# Patient Record
Sex: Male | Born: 1955 | Race: White | Hispanic: No | Smoking: Former smoker
Health system: Southern US, Community
[De-identification: ages and names within clinical notes are randomized; demographics above are authoritative.]

## PROBLEM LIST (undated history)

## (undated) DIAGNOSIS — N189 Chronic kidney disease, unspecified: Secondary | ICD-10-CM

## (undated) DIAGNOSIS — T8859XA Other complications of anesthesia, initial encounter: Secondary | ICD-10-CM

## (undated) DIAGNOSIS — C44310 Basal cell carcinoma of skin of unspecified parts of face: Secondary | ICD-10-CM

## (undated) DIAGNOSIS — M1990 Unspecified osteoarthritis, unspecified site: Secondary | ICD-10-CM

## (undated) DIAGNOSIS — E119 Type 2 diabetes mellitus without complications: Secondary | ICD-10-CM

## (undated) DIAGNOSIS — M792 Neuralgia and neuritis, unspecified: Secondary | ICD-10-CM

## (undated) DIAGNOSIS — R011 Cardiac murmur, unspecified: Secondary | ICD-10-CM

## (undated) DIAGNOSIS — T4145XA Adverse effect of unspecified anesthetic, initial encounter: Secondary | ICD-10-CM

## (undated) DIAGNOSIS — J189 Pneumonia, unspecified organism: Secondary | ICD-10-CM

## (undated) HISTORY — DX: Unspecified osteoarthritis, unspecified site: M19.90

## (undated) HISTORY — PX: BACK SURGERY: SHX140

## (undated) HISTORY — PX: HERNIA REPAIR: SHX51

## (undated) HISTORY — PX: SPINE SURGERY: SHX786

---
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
## 2004-08-09 ENCOUNTER — Ambulatory Visit: Payer: Self-pay | Admitting: Internal Medicine

## 2006-02-14 ENCOUNTER — Ambulatory Visit (HOSPITAL_COMMUNITY): Admission: RE | Admit: 2006-02-14 | Discharge: 2006-02-14 | Payer: Self-pay | Admitting: General Surgery

## 2010-10-12 ENCOUNTER — Encounter: Payer: Self-pay | Admitting: Internal Medicine

## 2010-10-12 ENCOUNTER — Ambulatory Visit (INDEPENDENT_AMBULATORY_CARE_PROVIDER_SITE_OTHER): Payer: Self-pay | Admitting: Internal Medicine

## 2010-10-12 VITALS — BP 130/90 | HR 90 | Temp 98.3°F | Resp 18 | Ht 70.0 in | Wt 204.0 lb

## 2010-10-12 DIAGNOSIS — G8929 Other chronic pain: Secondary | ICD-10-CM

## 2010-10-12 DIAGNOSIS — M545 Low back pain, unspecified: Secondary | ICD-10-CM | POA: Insufficient documentation

## 2010-10-12 MED ORDER — HYDROCODONE-ACETAMINOPHEN 10-650 MG PO TABS: 1.0000 | ORAL_TABLET | Freq: Four times a day (QID) | ORAL | Status: DC | PRN

## 2010-10-12 MED ORDER — CYCLOBENZAPRINE HCL 10 MG PO TABS: 10.0000 mg | ORAL_TABLET | Freq: Three times a day (TID) | ORAL | Status: DC | PRN

## 2010-10-12 NOTE — Patient Instructions (Signed)
Limit your sodium (Salt) intake    It is important that you exercise regularly, at least 20 minutes 3 to 4 times per week.  If you develop chest pain or shortness of breath seek  medical attention.  You need to lose weight.  Consider a lower calorie diet and regular exercise.  Return in one year for follow-up 

## 2010-10-12 NOTE — Progress Notes (Signed)
Subjective:    Patient ID: Vincent [name], male    DOB: 1955/04/14, 55 y.o.   MRN: [id number]  HPI  55 year old patient who is seen today to establish with our practice. He has the phone the past by [facility] family practice and his physicians have retired. He has a history of chronic low back pain that has required narcotic analgesics since October of last year. He states that he consumes approximately 50-60 Vicodin every 3 months he takes not daily but just for acute flares. He does work as a Nutritional therapist. He states that he is said to lumbar surgeries at age 35 and age 44 both performed by Dr. Roxan Hockey.  Past medical history is fairly unremarkable. Has history of mild allergic rhinitis. Past surgical history is remarkable for laminectomy x2 he has had a left angle hernia repair at age 74 and an umbilical hernia repair in 08/07/04  Family history father died in his late 24s of complications of Parkinson's disease. Mother died in her early 43s of an MI history of alcoholism. One brother and one sister are Health  Social history. He was a tobacco user until approximately 15 years ago he is married no children is self-employed as a Nutritional therapist. No health insurance   Review of Systems  Constitutional: Negative for fever, chills, activity change, appetite change and fatigue.  HENT: Negative for hearing loss, ear pain, congestion, rhinorrhea, sneezing, mouth sores, trouble swallowing, neck pain, neck stiffness, dental problem, voice change, sinus pressure and tinnitus.   Eyes: Negative for photophobia, pain, redness and visual disturbance.  Respiratory: Negative for apnea, cough, choking, chest tightness, shortness of breath and wheezing.   Cardiovascular: Negative for chest pain, palpitations and leg swelling.  Gastrointestinal: Negative for nausea, vomiting, abdominal pain, diarrhea, constipation, blood in stool, abdominal distention, anal bleeding and rectal pain.  Genitourinary: Negative for dysuria, urgency,  frequency, hematuria, flank pain, decreased urine volume, discharge, penile swelling, scrotal swelling, difficulty urinating, genital sores and testicular pain.  Musculoskeletal: Negative for myalgias, back pain, joint swelling, arthralgias and gait problem.  Skin: Negative for color change, rash and wound.  Neurological: Negative for dizziness, tremors, seizures, syncope, facial asymmetry, speech difficulty, weakness, light-headedness, numbness and headaches.  Hematological: Negative for adenopathy. Does not bruise/bleed easily.  Psychiatric/Behavioral: Negative for suicidal ideas, hallucinations, behavioral problems, confusion, sleep disturbance, self-injury, dysphoric mood, decreased concentration and agitation. The patient is not nervous/anxious.        Objective:   Physical Exam  Constitutional: He appears well-developed and well-nourished.       Moderately overweight. Blood pressure 130/84 Weight 204  HENT:  Head: Normocephalic and atraumatic.  Right Ear: External ear normal.  Left Ear: External ear normal.  Nose: Nose normal.  Mouth/Throat: Oropharynx is clear and moist.  Eyes: Conjunctivae and EOM are normal. Pupils are equal, round, and reactive to light. No scleral icterus.  Neck: Normal range of motion. Neck supple. No JVD present. No thyromegaly present.  Cardiovascular: Regular rhythm, normal heart sounds and intact distal pulses.  Exam reveals no gallop and no friction rub.   No murmur heard. Pulmonary/Chest: Effort normal and breath sounds normal. He exhibits no tenderness.  Abdominal: Soft. Bowel sounds are normal. He exhibits no distension and no mass. There is no tenderness.  Genitourinary: Prostate normal and penis normal. Guaiac negative stool.  Musculoskeletal: Normal range of motion. He exhibits no edema and no tenderness.  Lymphadenopathy:    He has no cervical adenopathy.  Neurological: He is alert. He has normal  reflexes. No cranial nerve deficit. Coordination  normal.  Skin: Skin is warm and dry. No rash noted.  Psychiatric: He has a normal mood and affect. His behavior is normal.          Assessment & Plan:   Chronic low back pain Exogenous obesity Remote tobacco use  Weight loss encouraged. Medications refilled. Return here in one year or when necessary  A controlled substance contract was discussed and signed

## 2011-06-03 ENCOUNTER — Other Ambulatory Visit: Payer: Self-pay | Admitting: Internal Medicine

## 2011-06-03 MED ORDER — CYCLOBENZAPRINE HCL 10 MG PO TABS: 10.0000 mg | ORAL_TABLET | Freq: Three times a day (TID) | ORAL | Status: DC | PRN

## 2011-06-03 MED ORDER — HYDROCODONE-ACETAMINOPHEN 10-650 MG PO TABS: 1.0000 | ORAL_TABLET | Freq: Four times a day (QID) | ORAL | Status: DC | PRN

## 2011-06-03 NOTE — Telephone Encounter (Signed)
Flexeril 10  #30  RF 3 vicodin  #60  RF 1

## 2011-06-03 NOTE — Telephone Encounter (Signed)
Pt was only seen once to establish 09/2010 Both lorcet and flexeril written at that time # 30 3RF There have been no other request for any meds since then

## 2011-06-03 NOTE — Telephone Encounter (Signed)
Patient called stating that he needs his hydrocodone  refilled and also his flexiril. Please assist. Patient states he has been running out of pain pills and would like the MD to adjust his rx.

## 2011-06-03 NOTE — Telephone Encounter (Signed)
done

## 2011-12-04 ENCOUNTER — Ambulatory Visit (INDEPENDENT_AMBULATORY_CARE_PROVIDER_SITE_OTHER): Payer: Self-pay | Admitting: Internal Medicine

## 2011-12-04 ENCOUNTER — Encounter: Payer: Self-pay | Admitting: Internal Medicine

## 2011-12-04 VITALS — BP 122/76 | Temp 98.4°F | Wt 200.0 lb

## 2011-12-04 DIAGNOSIS — M545 Low back pain, unspecified: Secondary | ICD-10-CM

## 2011-12-04 DIAGNOSIS — G8929 Other chronic pain: Secondary | ICD-10-CM

## 2011-12-04 MED ORDER — HYDROCODONE-ACETAMINOPHEN 10-650 MG PO TABS: 1.0000 | ORAL_TABLET | Freq: Four times a day (QID) | ORAL | Status: DC | PRN

## 2011-12-04 MED ORDER — CYCLOBENZAPRINE HCL 10 MG PO TABS: 10.0000 mg | ORAL_TABLET | Freq: Three times a day (TID) | ORAL | Status: DC | PRN

## 2011-12-04 MED ORDER — NAPROXEN 500 MG PO TABS: 500.0000 mg | ORAL_TABLET | Freq: Two times a day (BID) | ORAL | Status: DC

## 2011-12-04 NOTE — Progress Notes (Signed)
  Subjective:    Patient ID: Vincent [name], male    DOB: 11/07/1955, 56 y.o.   MRN: [id number]  HPI  56 year old patient who is seen today in followup. He establish with our practice one year ago. He has a history of chronic low back pain that is about the same. He is had 2 laminectomies over the years. He uses both anti-inflammatories as well as narcotic analgesics. Most days he uses 1/2-1 hydrocodone daily. There's been no escalation of his pain medication use. 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  Past Medical History  Diagnosis Date  . Arthritis     History   Social History  . Marital Status: Married    Spouse Name: N/A    Number of Children: N/A  . Years of Education: N/A   Occupational History  . Not on file.   Social History Main Topics  . Smoking status: Former Smoker    Quit date: 03/25/1988  . Smokeless tobacco: Never Used  . Alcohol Use: No  . Drug Use: No  . Sexually Active: Not on file   Other Topics Concern  . Not on file   Social History Narrative  . No narrative on file    Past Surgical History  Procedure Date  . Back surgery     x2  . Hernia repair   . Spine surgery     x2    No family history on file.  No Known Allergies  No current outpatient prescriptions on file prior to visit.    BP 122/76  Temp 98.4 F (36.9 C) (Oral)  Wt 200 lb (90.719 kg)       Review of Systems  Constitutional: Negative for fever, chills, appetite change and fatigue.  HENT: Negative for hearing loss, ear pain, congestion, sore throat, trouble swallowing, neck stiffness, dental problem, voice change and tinnitus.   Eyes: Negative for pain, discharge and visual disturbance.  Respiratory: Negative for cough, chest tightness, wheezing and stridor.   Cardiovascular: Negative for chest pain, palpitations and leg swelling.  Gastrointestinal: Negative for nausea, vomiting, abdominal pain, diarrhea, constipation, blood in stool  and abdominal distention.  Genitourinary: Negative for urgency, hematuria, flank pain, discharge, difficulty urinating and genital sores.  Musculoskeletal: Positive for back pain. Negative for myalgias, joint swelling, arthralgias and gait problem.  Skin: Negative for rash.  Neurological: Negative for dizziness, syncope, speech difficulty, weakness, numbness and headaches.  Hematological: Negative for adenopathy. Does not bruise/bleed easily.  Psychiatric/Behavioral: Negative for behavioral problems and dysphoric mood. The patient is not nervous/anxious.        Objective:   Physical Exam  Constitutional: He appears well-developed and well-nourished. No distress.       Blood pressure normal  Cardiovascular: Normal rate and regular rhythm.   Pulmonary/Chest: Effort normal and breath sounds normal. No respiratory distress.  Abdominal: Soft.          Assessment & Plan:   Chronic low back pain Medications dispensed Recheck next year for his annual physical. Hopefully we'll have health insurance at that time

## 2011-12-04 NOTE — Patient Instructions (Signed)
Limit your sodium (Salt) intake  You need to lose weight.  Consider a lower calorie diet and regular exercise.  Return in one year for follow-up   

## 2011-12-09 ENCOUNTER — Ambulatory Visit: Payer: Self-pay | Admitting: Internal Medicine

## 2012-02-27 ENCOUNTER — Telehealth: Payer: Self-pay | Admitting: Internal Medicine

## 2012-02-27 MED ORDER — NAPROXEN 500 MG PO TABS: 500.0000 mg | ORAL_TABLET | Freq: Two times a day (BID) | ORAL | Status: DC | PRN

## 2012-02-27 NOTE — Telephone Encounter (Signed)
Unaware of a 750 dose. Please call in naproxen 500 mg #90 one twice a day when necessary refill x2

## 2012-02-27 NOTE — Telephone Encounter (Signed)
Left message on voicemail. Rx refill for Naproxen 500 mg was sent to pharmacy.

## 2012-02-27 NOTE — Telephone Encounter (Signed)
Patient called stating that he was given a sample of naproxen sodium 750mg  and it worked and he would like to have this called into [facility]/[address]. Please assist and inform patient when done.

## 2012-02-28 NOTE — Telephone Encounter (Signed)
Pt called and has been informed that rx has been called in as noted.

## 2012-05-12 ENCOUNTER — Telehealth: Payer: Self-pay | Admitting: Internal Medicine

## 2012-05-12 NOTE — Telephone Encounter (Signed)
Left message on voicemail,  We do not have samples of Naprelan.

## 2012-05-12 NOTE — Telephone Encounter (Signed)
Pt need samples of naprelan 750 mg manufactured by [employer]

## 2012-05-13 NOTE — Telephone Encounter (Signed)
Left message samples put at the front desk for pick up.

## 2012-05-13 NOTE — Telephone Encounter (Signed)
Left message on voicemail did find samples of Naprelan.

## 2012-06-04 ENCOUNTER — Telehealth: Payer: Self-pay | Admitting: Internal Medicine

## 2012-06-04 MED ORDER — HYDROCODONE-ACETAMINOPHEN 10-325 MG PO TABS: 1.0000 | ORAL_TABLET | Freq: Four times a day (QID) | ORAL | Status: DC | PRN

## 2012-06-04 NOTE — Telephone Encounter (Signed)
Left message on voicemail. New Rx for Hydrocodone called into pharmacy.

## 2012-06-04 NOTE — Telephone Encounter (Signed)
Patient called stating that his pharmacy states laws have changed and he need a new hydrocodone rx to reflect changes. Please assist and inform patient when available.

## 2012-06-30 ENCOUNTER — Telehealth: Payer: Self-pay | Admitting: Internal Medicine

## 2012-06-30 MED ORDER — SILDENAFIL CITRATE 100 MG PO TABS: 100.0000 mg | ORAL_TABLET | Freq: Every day | ORAL | Status: DC | PRN

## 2012-06-30 NOTE — Telephone Encounter (Signed)
Spoke to pt told him Rx for Viagra was sent to pharmacy. Pt verbalized understanding.

## 2012-06-30 NOTE — Telephone Encounter (Signed)
OK  Viagra 100 mg  #6  RF 6

## 2012-06-30 NOTE — Telephone Encounter (Signed)
Pt called to request a RX of Viagra. He would like it send to [facility] at [address]. Please assist. (He also wanted me to tell you how much he appreciates your help with his last RX, and getting it filled)

## 2012-08-21 ENCOUNTER — Ambulatory Visit (INDEPENDENT_AMBULATORY_CARE_PROVIDER_SITE_OTHER): Payer: Self-pay | Admitting: Internal Medicine

## 2012-08-21 ENCOUNTER — Encounter: Payer: Self-pay | Admitting: Internal Medicine

## 2012-08-21 VITALS — BP 140/80 | HR 100 | Temp 98.7°F | Resp 20 | Wt 200.0 lb

## 2012-08-21 DIAGNOSIS — G8929 Other chronic pain: Secondary | ICD-10-CM

## 2012-08-21 DIAGNOSIS — M545 Low back pain, unspecified: Secondary | ICD-10-CM

## 2012-08-21 MED ORDER — CYCLOBENZAPRINE HCL 10 MG PO TABS: 10.0000 mg | ORAL_TABLET | Freq: Three times a day (TID) | ORAL | Status: DC | PRN

## 2012-08-21 MED ORDER — SILDENAFIL CITRATE 100 MG PO TABS: 100.0000 mg | ORAL_TABLET | Freq: Every day | ORAL | Status: DC | PRN

## 2012-08-21 MED ORDER — HYDROCODONE-ACETAMINOPHEN 10-325 MG PO TABS: 1.0000 | ORAL_TABLET | Freq: Four times a day (QID) | ORAL | Status: DC | PRN

## 2012-08-21 NOTE — Patient Instructions (Addendum)
Back Pain, Adult Low back pain is very common. About 1 in 5 people have back pain.The cause of low back pain is rarely dangerous. The pain often gets better over time.About half of people with a sudden onset of back pain feel better in just 2 weeks. About 8 in 10 people feel better by 6 weeks.  CAUSES Some common causes of back pain include:  Strain of the muscles or ligaments supporting the spine.  Wear and tear (degeneration) of the spinal discs.  Arthritis.  Direct injury to the back. DIAGNOSIS Most of the time, the direct cause of low back pain is not known.However, back pain can be treated effectively even when the exact cause of the pain is unknown.Answering your caregiver's questions about your overall health and symptoms is one of the most accurate ways to make sure the cause of your pain is not dangerous. If your caregiver needs more information, he or she may order lab work or imaging tests (X-rays or MRIs).However, even if imaging tests show changes in your back, this usually does not require surgery. HOME CARE INSTRUCTIONS For many people, back pain returns.Since low back pain is rarely dangerous, it is often a condition that people can learn to manageon their own.   Remain active. It is stressful on the back to sit or stand in one place. Do not sit, drive, or stand in one place for more than 30 minutes at a time. Take short walks on level surfaces as soon as pain allows.Try to increase the length of time you walk each day.  Do not stay in bed.Resting more than 1 or 2 days can delay your recovery.  Do not avoid exercise or work.Your body is made to move.It is not dangerous to be active, even though your back may hurt.Your back will likely heal faster if you return to being active before your pain is gone.  Pay attention to your body when you bend and lift. Many people have less discomfortwhen lifting if they bend their knees, keep the load close to their bodies,and  avoid twisting. Often, the most comfortable positions are those that put less stress on your recovering back.  Find a comfortable position to sleep. Use a firm mattress and lie on your side with your knees slightly bent. If you lie on your back, put a pillow under your knees.  Only take over-the-counter or prescription medicines as directed by your caregiver. Over-the-counter medicines to reduce pain and inflammation are often the most helpful.Your caregiver may prescribe muscle relaxant drugs.These medicines help dull your pain so you can more quickly return to your normal activities and healthy exercise.  Put ice on the injured area.  Put ice in a plastic bag.  Place a towel between your skin and the bag.  Leave the ice on for 15-20 minutes, 3-4 times a day for the first 2 to 3 days. After that, ice and heat may be alternated to reduce pain and spasms.  Ask your caregiver about trying back exercises and gentle massage. This may be of some benefit.  Avoid feeling anxious or stressed.Stress increases muscle tension and can worsen back pain.It is important to recognize when you are anxious or stressed and learn ways to manage it.Exercise is a great option. SEEK MEDICAL CARE IF:  You have pain that is not relieved with rest or medicine.  You have pain that does not improve in 1 week.  You have new symptoms.  You are generally not feeling well. SEEK   IMMEDIATE MEDICAL CARE IF:   You have pain that radiates from your back into your legs.  You develop new bowel or bladder control problems.  You have unusual weakness or numbness in your arms or legs.  You develop nausea or vomiting.  You develop abdominal pain.  You feel faint. Document Released: 03/11/2005 Document Revised: 09/10/2011 Document Reviewed: 07/30/2010 [facility] Patient Information 2014 [place], [place]. Back Injury Prevention Back injuries can be extremely painful and difficult to heal. After having one back  injury, you are much more likely to experience another later on. It is important to learn how to avoid injuring or re-injuring your back. The following tips can help you to prevent a back injury. PHYSICAL FITNESS  Exercise regularly and try to develop good tone in your abdominal muscles. Your abdominal muscles provide a lot of the support needed by your back.  Do aerobic exercises (walking, jogging, biking, swimming) regularly.  Do exercises that increase balance and strength (tai chi, yoga) regularly. This can decrease your risk of falling and injuring your back.  Stretch before and after exercising.  Maintain a healthy weight. The more you weigh, the more stress is placed on your back. For every pound of weight, 10 times that amount of pressure is placed on the back. DIET  Talk to your caregiver about how much calcium and vitamin D you need per day. These nutrients help to prevent weakening of the bones (osteoporosis). Osteoporosis can cause broken (fractured) bones that lead to back pain.  Include good sources of calcium in your diet, such as dairy products, green, leafy vegetables, and products with calcium added (fortified).  Include good sources of vitamin D in your diet, such as milk and foods that are fortified with vitamin D.  Consider taking a nutritional supplement or a multivitamin if needed.  Stop smoking if you smoke. POSTURE  Sit and stand up straight. Avoid leaning forward when you sit or hunching over when you stand.  Choose chairs with good low back (lumbar) support.  If you work at a desk, sit close to your work so you do not need to lean over. Keep your chin tucked in. Keep your neck drawn back and elbows bent at a right angle. Your arms should look like the letter "L."  Sit high and close to the steering wheel when you drive. Add a lumbar support to your car seat if needed.  Avoid sitting or standing in one position for too long. Take breaks to get up, stretch,  and walk around at least once every hour. Take breaks if you are driving for long periods of time.  Sleep on your side with your knees slightly bent, or sleep on your back with a pillow under your knees. Do not sleep on your stomach. LIFTING, TWISTING, AND REACHING  Avoid heavy lifting, especially repetitive lifting. If you must do heavy lifting:  Stretch before lifting.  Work slowly.  Rest between lifts.  Use carts and dollies to move objects when possible.  Make several small trips instead of carrying 1 heavy load.  Ask for help when you need it.  Ask for help when moving big, awkward objects.  Follow these steps when lifting:  Stand with your feet shoulder-width apart.  Get as close to the object as you can. Do not try to pick up heavy objects that are far from your body.  Use handles or lifting straps if they are available.  Bend at your knees. Squat down, but keep your  heels off the floor.  Keep your shoulders pulled back, your chin tucked in, and your back straight.  Lift the object slowly, tightening the muscles in your legs, abdomen, and buttocks. Keep the object as close to the center of your body as possible.  When you put a load down, use these same guidelines in reverse.  Do not:  Lift the object above your waist.  Twist at the waist while lifting or carrying a load. Move your feet if you need to turn, not your waist.  Bend over without bending at your knees.  Avoid reaching over your head, across a table, or for an object on a high surface. OTHER TIPS  Avoid wet floors and keep sidewalks clear of ice to prevent falls.  Do not sleep on a mattress that is too soft or too hard.  Keep items that are used frequently within easy reach.  Put heavier objects on shelves at waist level and lighter objects on lower or higher shelves.  Find ways to decrease your stress, such as exercise, massage, or relaxation techniques. Stress can build up in your muscles.  Tense muscles are more vulnerable to injury.  Seek treatment for depression or anxiety if needed. These conditions can increase your risk of developing back pain. SEEK MEDICAL CARE IF:  You injure your back.  You have questions about diet, exercise, or other ways to prevent back injuries. MAKE SURE YOU:  Understand these instructions.  Will watch your condition.  Will get help right away if you are not doing well or get worse. Document Released: 04/18/2004 Document Revised: 06/03/2011 Document Reviewed: 04/22/2011 [facility] Patient Information 2014 [place], [place]. Back Exercises Back exercises help treat and prevent back injuries. The goal of back exercises is to increase the strength of your abdominal and back muscles and the flexibility of your back. These exercises should be started when you no longer have back pain. Back exercises include:  Pelvic Tilt. Lie on your back with your knees bent. Tilt your pelvis until the lower part of your back is against the floor. Hold this position 5 to 10 sec and repeat 5 to 10 times.  Knee to Chest. Pull first 1 knee up against your chest and hold for 20 to 30 seconds, repeat this with the other knee, and then both knees. This may be done with the other leg straight or bent, whichever feels better.  Sit-Ups or Curl-Ups. Bend your knees 90 degrees. Start with tilting your pelvis, and do a partial, slow sit-up, lifting your trunk only 30 to 45 degrees off the floor. Take at least 2 to 3 seconds for each sit-up. Do not do sit-ups with your knees out straight. If partial sit-ups are difficult, simply do the above but with only tightening your abdominal muscles and holding it as directed.  Hip-Lift. Lie on your back with your knees flexed 90 degrees. Push down with your feet and shoulders as you raise your hips a couple inches off the floor; hold for 10 seconds, repeat 5 to 10 times.  Back arches. Lie on your stomach, propping yourself up on bent elbows.  Slowly press on your hands, causing an arch in your low back. Repeat 3 to 5 times. Any initial stiffness and discomfort should lessen with repetition over time.  Shoulder-Lifts. Lie face down with arms beside your body. Keep hips and torso pressed to floor as you slowly lift your head and shoulders off the floor. Do not overdo your exercises, especially in the  beginning. Exercises may cause you some mild back discomfort which lasts for a few minutes; however, if the pain is more severe, or lasts for more than 15 minutes, do not continue exercises until you see your caregiver. Improvement with exercise therapy for back problems is slow.  See your caregivers for assistance with developing a proper back exercise program. Document Released: 04/18/2004 Document Revised: 06/03/2011 Document Reviewed: 01/10/2011 [facility] Patient Information 2014 [place], [place].

## 2012-08-21 NOTE — Progress Notes (Signed)
  Subjective:    Patient ID: Vincent [name], male    DOB: 01-01-1956, 57 y.o.   MRN: [id number]  HPI  57 year old patient who has a history chronic low back pain. He is seen today for followup. He is seen in frequently do to lack of health insurance. He has had a recent flare over the past 4 weeks but the pain has improved in addition to low back pain he also complains of left leg spasm. He states numbness and burning pain involving anterior thigh and occasionally the bottom of his left foot. He denies any motor weakness. No constitutional complaints. He has had laminectomy on 2 occasions but not in greater than 20 years. He is using Advil and the hydrocodone with acetaminophen.  Past Medical History  Diagnosis Date  . Arthritis     History   Social History  . Marital Status: Married    Spouse Name: N/A    Number of Children: N/A  . Years of Education: N/A   Occupational History  . Not on file.   Social History Main Topics  . Smoking status: Former Smoker    Quit date: 03/25/1988  . Smokeless tobacco: Never Used  . Alcohol Use: No  . Drug Use: No  . Sexually Active: Not on file   Other Topics Concern  . Not on file   Social History Narrative  . No narrative on file    Past Surgical History  Procedure Laterality Date  . Back surgery      x2  . Hernia repair    . Spine surgery      x2    History reviewed. No pertinent family history.  No Known Allergies  No current outpatient prescriptions on file prior to visit.   No current facility-administered medications on file prior to visit.    BP 140/80  Pulse 100  Temp(Src) 98.7 F (37.1 C) (Oral)  Resp 20  Wt 200 lb (90.719 kg)  BMI 28.7 kg/m2  SpO2 98%    he has variable    Review of Systems  Constitutional: Negative for fever, chills, appetite change and fatigue.  HENT: Negative for hearing loss, ear pain, congestion, sore throat, trouble swallowing, neck stiffness, dental problem, voice change and  tinnitus.   Eyes: Negative for pain, discharge and visual disturbance.  Respiratory: Negative for cough, chest tightness, wheezing and stridor.   Cardiovascular: Negative for chest pain, palpitations and leg swelling.  Gastrointestinal: Negative for nausea, vomiting, abdominal pain, diarrhea, constipation, blood in stool and abdominal distention.  Genitourinary: Negative for urgency, hematuria, flank pain, discharge, difficulty urinating and genital sores.  Musculoskeletal: Positive for back pain. Negative for myalgias, joint swelling, arthralgias and gait problem.  Skin: Negative for rash.  Neurological: Negative for dizziness, syncope, speech difficulty, weakness, numbness and headaches.  Hematological: Negative for adenopathy. Does not bruise/bleed easily.  Psychiatric/Behavioral: Negative for behavioral problems and dysphoric mood. The patient is not nervous/anxious.        Objective:   Physical Exam  Constitutional: He appears well-developed and well-nourished. No distress.  Musculoskeletal:  Negative straight leg test Patellar reflexes brisk Achilles reflexes symmetrically blunted Able to walk on toes and heels without difficulty          Assessment & Plan:  Chronic low back pain. Medications refilled  Back exercises recommended an information dispensed

## 2012-09-04 ENCOUNTER — Telehealth: Payer: Self-pay | Admitting: Internal Medicine

## 2012-09-04 NOTE — Telephone Encounter (Signed)
Attempted to call back @ 1315 09/04/12- no answer x 2 attempts. Left message to call office if assistance needed. JK/CAN

## 2012-12-07 ENCOUNTER — Encounter: Payer: Self-pay | Admitting: Internal Medicine

## 2012-12-07 ENCOUNTER — Ambulatory Visit (INDEPENDENT_AMBULATORY_CARE_PROVIDER_SITE_OTHER): Payer: Self-pay | Admitting: Internal Medicine

## 2012-12-07 VITALS — BP 130/80 | HR 100 | Temp 98.4°F | Resp 20 | Wt 195.0 lb

## 2012-12-07 DIAGNOSIS — Z23 Encounter for immunization: Secondary | ICD-10-CM

## 2012-12-07 DIAGNOSIS — M545 Low back pain: Secondary | ICD-10-CM

## 2012-12-07 DIAGNOSIS — G4700 Insomnia, unspecified: Secondary | ICD-10-CM

## 2012-12-07 DIAGNOSIS — G8929 Other chronic pain: Secondary | ICD-10-CM

## 2012-12-07 MED ORDER — HYDROCODONE-ACETAMINOPHEN 10-325 MG PO TABS: 1.0000 | ORAL_TABLET | Freq: Four times a day (QID) | ORAL | Status: DC | PRN

## 2012-12-07 MED ORDER — TRAZODONE HCL 50 MG PO TABS: 25.0000 mg | ORAL_TABLET | Freq: Every evening | ORAL | Status: DC | PRN

## 2012-12-07 NOTE — Progress Notes (Signed)
Subjective:    Patient ID: Vincent [name], male    DOB: 12/22/1955, 57 y.o.   MRN: [id number]  HPI  57 year old patient who is seen today for followup he has chronic low back pain and is on chronic narcotic therapy. He's had 2 prior back surgeries. He feels his back is modestly improved. He works as a Nutritional therapist. He also has some insomnia issues and some dermatologic concerns.  Past Medical History  Diagnosis Date  . Arthritis     History   Social History  . Marital Status: Married    Spouse Name: N/A    Number of Children: N/A  . Years of Education: N/A   Occupational History  . Not on file.   Social History Main Topics  . Smoking status: Former Smoker    Quit date: 03/25/1988  . Smokeless tobacco: Never Used  . Alcohol Use: No  . Drug Use: No  . Sexual Activity: Not on file   Other Topics Concern  . Not on file   Social History Narrative  . No narrative on file    Past Surgical History  Procedure Laterality Date  . Back surgery      x2  . Hernia repair    . Spine surgery      x2    History reviewed. No pertinent family history.  No Known Allergies  Current Outpatient Prescriptions on File Prior to Visit  Medication Sig Dispense Refill  . cyclobenzaprine (FLEXERIL) 10 MG tablet Take 1 tablet (10 mg total) by mouth 3 (three) times daily as needed.  30 tablet  6  . sildenafil (VIAGRA) 100 MG tablet Take 1 tablet (100 mg total) by mouth daily as needed for erectile dysfunction.  6 tablet  6   No current facility-administered medications on file prior to visit.    BP 130/80  Pulse 100  Temp(Src) 98.4 F (36.9 C) (Oral)  Resp 20  Wt 195 lb (88.451 kg)  BMI 27.98 kg/m2  SpO2 97%       Review of Systems  Constitutional: Negative for fever, chills, appetite change and fatigue.  HENT: Negative for hearing loss, ear pain, congestion, sore throat, trouble swallowing, neck stiffness, dental problem, voice change and tinnitus.   Eyes: Negative for pain,  discharge and visual disturbance.  Respiratory: Negative for cough, chest tightness, wheezing and stridor.   Cardiovascular: Negative for chest pain, palpitations and leg swelling.  Gastrointestinal: Negative for nausea, vomiting, abdominal pain, diarrhea, constipation, blood in stool and abdominal distention.  Genitourinary: Negative for urgency, hematuria, flank pain, discharge, difficulty urinating and genital sores.  Musculoskeletal: Positive for back pain. Negative for myalgias, joint swelling, arthralgias and gait problem.  Skin: Negative for rash.  Neurological: Negative for dizziness, syncope, speech difficulty, weakness, numbness and headaches.  Hematological: Negative for adenopathy. Does not bruise/bleed easily.  Psychiatric/Behavioral: Positive for sleep disturbance. Negative for behavioral problems and dysphoric mood. The patient is not nervous/anxious.        Objective:   Physical Exam  Constitutional: He is oriented to person, place, and time. He appears well-developed.  HENT:  Head: Normocephalic.  Right Ear: External ear normal.  Left Ear: External ear normal.  Eyes: Conjunctivae and EOM are normal.  Neck: Normal range of motion.  Cardiovascular: Normal rate and normal heart sounds.   Pulmonary/Chest: Breath sounds normal.  Abdominal: Bowel sounds are normal.  Musculoskeletal: Normal range of motion. He exhibits no edema and no tenderness.  Neurological: He is alert and oriented to  person, place, and time.  Psychiatric: He has a normal mood and affect. His behavior is normal.          Assessment & Plan:   Chronic low back pain. Prescription refilled Insomnia. Sleep hygiene issues discussed. Information dispensed. Will give a trial of trazodone  Return in 6 months or as needed Weight loss encouraged

## 2012-12-07 NOTE — Patient Instructions (Addendum)
It is important that you exercise regularly, at least 20 minutes 3 to 4 times per week.  If you develop chest pain or shortness of breath seek  medical attention.  You need to lose weight.  Consider a lower calorie diet and regular exercise.Insomnia Insomnia is frequent trouble falling and/or staying asleep. Insomnia can be a long term problem or a short term problem. Both are common. Insomnia can be a short term problem when the wakefulness is related to a certain stress or worry. Long term insomnia is often related to ongoing stress during waking hours and/or poor sleeping habits. Overtime, sleep deprivation itself can make the problem worse. Every little thing feels more severe because you are overtired and your ability to cope is decreased. CAUSES   Stress, anxiety, and depression.  Poor sleeping habits.  Distractions such as TV in the bedroom.  Naps close to bedtime.  Engaging in emotionally charged conversations before bed.  Technical reading before sleep.  Alcohol and other sedatives. They may make the problem worse. They can hurt normal sleep patterns and normal dream activity.  Stimulants such as caffeine for several hours prior to bedtime.  Pain syndromes and shortness of breath can cause insomnia.  Exercise late at night.  Changing time zones may cause sleeping problems (jet lag). It is sometimes helpful to have someone observe your sleeping patterns. They should look for periods of not breathing during the night (sleep apnea). They should also look to see how long those periods last. If you live alone or observers are uncertain, you can also be observed at a sleep clinic where your sleep patterns will be professionally monitored. Sleep apnea requires a checkup and treatment. Give your caregivers your medical history. Give your caregivers observations your family has made about your sleep.  SYMPTOMS   Not feeling rested in the morning.  Anxiety and restlessness at  bedtime.  Difficulty falling and staying asleep. TREATMENT   Your caregiver may prescribe treatment for an underlying medical disorders. Your caregiver can give advice or help if you are using alcohol or other drugs for self-medication. Treatment of underlying problems will usually eliminate insomnia problems.  Medications can be prescribed for short time use. They are generally not recommended for lengthy use.  Over-the-counter sleep medicines are not recommended for lengthy use. They can be habit forming.  You can promote easier sleeping by making lifestyle changes such as:  Using relaxation techniques that help with breathing and reduce muscle tension.  Exercising earlier in the day.  Changing your diet and the time of your last meal. No night time snacks.  Establish a regular time to go to bed.  Counseling can help with stressful problems and worry.  Soothing music and white noise may be helpful if there are background noises you cannot remove.  Stop tedious detailed work at least one hour before bedtime. HOME CARE INSTRUCTIONS   Keep a diary. Inform your caregiver about your progress. This includes any medication side effects. See your caregiver regularly. Take note of:  Times when you are asleep.  Times when you are awake during the night.  The quality of your sleep.  How you feel the next day. This information will help your caregiver care for you.  Get out of bed if you are still awake after 15 minutes. Read or do some quiet activity. Keep the lights down. Wait until you feel sleepy and go back to bed.  Keep regular sleeping and waking hours. Avoid naps.  Exercise  regularly.  Avoid distractions at bedtime. Distractions include watching television or engaging in any intense or detailed activity like attempting to balance the household checkbook.  Develop a bedtime ritual. Keep a familiar routine of bathing, brushing your teeth, climbing into bed at the same time  each night, listening to soothing music. Routines increase the success of falling to sleep faster.  Use relaxation techniques. This can be using breathing and muscle tension release routines. It can also include visualizing peaceful scenes. You can also help control troubling or intruding thoughts by keeping your mind occupied with boring or repetitive thoughts like the old concept of counting sheep. You can make it more creative like imagining planting one beautiful flower after another in your backyard garden.  During your day, work to eliminate stress. When this is not possible use some of the previous suggestions to help reduce the anxiety that accompanies stressful situations. MAKE SURE YOU:   Understand these instructions.  Will watch your condition.  Will get help right away if you are not doing well or get worse. Document Released: 03/08/2000 Document Revised: 06/03/2011 Document Reviewed: 04/08/2007 [facility] Patient Information 2014 [place], [place].

## 2012-12-07 NOTE — Progress Notes (Signed)
  Subjective:    Patient ID: Vincent [name], male    DOB: 08/24/1955, 57 y.o.   MRN: [id number]  HPI  Wt Readings from Last 3 Encounters:  12/07/12 195 lb (88.451 kg)  08/21/12 200 lb (90.719 kg)  12/04/11 200 lb (90.719 kg)    Review of Systems     Objective:   Physical Exam        Assessment & Plan:

## 2013-01-14 ENCOUNTER — Telehealth: Payer: Self-pay | Admitting: Internal Medicine

## 2013-01-14 MED ORDER — HYDROCODONE-ACETAMINOPHEN 10-325 MG PO TABS: 1.0000 | ORAL_TABLET | Freq: Four times a day (QID) | ORAL | Status: DC | PRN

## 2013-01-14 NOTE — Telephone Encounter (Signed)
Pt notified Rx ready for pick up at front desk.

## 2013-01-14 NOTE — Telephone Encounter (Signed)
Tried to call pt number not working will call again later. Rx ready for pickup. Rx printed and signed, put at front desk for pickup.

## 2013-01-14 NOTE — Telephone Encounter (Signed)
Pt need rx norco

## 2013-02-11 ENCOUNTER — Telehealth: Payer: Self-pay | Admitting: Internal Medicine

## 2013-02-11 NOTE — Telephone Encounter (Signed)
Spoke to pt told him Rx will be ready on Monday 11/24 to pick up. Pt verbalized understanding.

## 2013-02-11 NOTE — Telephone Encounter (Signed)
Pt requesting a refill of HYDROcodone-acetaminophen (NORCO) 10-325 MG per tablet.  Please call when available for pick up

## 2013-02-15 MED ORDER — HYDROCODONE-ACETAMINOPHEN 10-325 MG PO TABS: 1.0000 | ORAL_TABLET | Freq: Four times a day (QID) | ORAL | Status: DC | PRN

## 2013-02-15 NOTE — Telephone Encounter (Signed)
Rx printed and signed, put at front desk for pick up. 

## 2013-03-17 ENCOUNTER — Other Ambulatory Visit: Payer: Self-pay | Admitting: *Deleted

## 2013-03-17 MED ORDER — HYDROCODONE-ACETAMINOPHEN 10-325 MG PO TABS: 1.0000 | ORAL_TABLET | Freq: Four times a day (QID) | ORAL | Status: DC | PRN

## 2013-04-12 ENCOUNTER — Telehealth: Payer: Self-pay | Admitting: Internal Medicine

## 2013-04-12 NOTE — Telephone Encounter (Signed)
Pt needs new rx hydrocodone °

## 2013-04-14 NOTE — Telephone Encounter (Signed)
Pt called stating he is completely out of HYDROcodone-acetaminophen (NORCO) 10-325 MG per tablet.

## 2013-04-15 NOTE — Telephone Encounter (Signed)
Left detailed message Rx will be ready tomorrow for pickup.

## 2013-04-16 MED ORDER — HYDROCODONE-ACETAMINOPHEN 10-325 MG PO TABS: 1.0000 | ORAL_TABLET | Freq: Four times a day (QID) | ORAL | Status: DC | PRN

## 2013-04-16 NOTE — Telephone Encounter (Signed)
Left detailed message Rx ready for pickup at the front desk.

## 2013-04-16 NOTE — Telephone Encounter (Signed)
Rx printed and signed.  

## 2013-04-28 ENCOUNTER — Encounter: Payer: Self-pay | Admitting: Internal Medicine

## 2013-04-28 ENCOUNTER — Ambulatory Visit (INDEPENDENT_AMBULATORY_CARE_PROVIDER_SITE_OTHER): Payer: Self-pay | Admitting: Internal Medicine

## 2013-04-28 VITALS — BP 140/80 | HR 86 | Temp 98.3°F | Resp 20 | Ht 70.0 in | Wt 197.0 lb

## 2013-04-28 DIAGNOSIS — M65839 Other synovitis and tenosynovitis, unspecified forearm: Secondary | ICD-10-CM

## 2013-04-28 DIAGNOSIS — M65849 Other synovitis and tenosynovitis, unspecified hand: Secondary | ICD-10-CM

## 2013-04-28 DIAGNOSIS — M778 Other enthesopathies, not elsewhere classified: Secondary | ICD-10-CM

## 2013-04-28 MED ORDER — METHYLPREDNISOLONE ACETATE 80 MG/ML IJ SUSP
80.0000 mg | Freq: Once | INTRAMUSCULAR | Status: AC
  Administered 2013-04-28: 80 mg via INTRAMUSCULAR

## 2013-04-28 NOTE — Progress Notes (Signed)
Pre-visit discussion using our clinic review tool. No additional management support is needed unless otherwise documented below in the visit note.  

## 2013-04-28 NOTE — Addendum Note (Signed)
Addended by: Marian Sorrow on: 04/28/2013 12:43 PM   Modules accepted: Orders

## 2013-04-28 NOTE — Patient Instructions (Addendum)
You  may move around, but avoid painful motions and activities.  Apply ice to the sore area for 15 to 20 minutes 3 or 4 times daily for the next two to 3 days.  Call or return to clinic prn if these symptoms worsen or fail to improve as anticipated.  Take 400-600 mg of ibuprofen ( Advil, Motrin) with food every  6 hours as needed for pain relief or control of feverTendinitis Tendinitis is swelling and inflammation of the tendons. Tendons are band-like tissues that connect muscle to bone. Tendinitis commonly occurs in the:   Shoulders (rotator cuff).  Heels (Achilles tendon).  Elbows (triceps tendon). CAUSES Tendinitis is usually caused by overusing the tendon, muscles, and joints involved. When the tissue surrounding a tendon (synovium) becomes inflamed, it is called tenosynovitis. Tendinitis commonly develops in people whose jobs require repetitive motions. SYMPTOMS  Pain.  Tenderness.  Mild swelling. DIAGNOSIS Tendinitis is usually diagnosed by physical exam. Your caregiver may also order X-rays or other imaging tests. TREATMENT Your caregiver may recommend certain medicines or exercises for your treatment. HOME CARE INSTRUCTIONS   Use a sling or splint for as long as directed by your caregiver until the pain decreases.  Put ice on the injured area.  Put ice in a plastic bag.  Place a towel between your skin and the bag.  Leave the ice on for 15-20 minutes, 03-04 times a day.  Avoid using the limb while the tendon is painful. Perform gentle range of motion exercises only as directed by your caregiver. Stop exercises if pain or discomfort increase, unless directed otherwise by your caregiver.  Only take over-the-counter or prescription medicines for pain, discomfort, or fever as directed by your caregiver. SEEK MEDICAL CARE IF:   Your pain and swelling increase.  You develop new, unexplained symptoms, especially increased numbness in the hands. MAKE SURE YOU:    Understand these instructions.  Will watch your condition.  Will get help right away if you are not doing well or get worse. Document Released: 03/08/2000 Document Revised: 06/03/2011 Document Reviewed: 05/28/2010 [facility] Patient Information 2014 [place], [place].

## 2013-04-28 NOTE — Progress Notes (Signed)
   Subjective:    Patient ID: Vincent [name], male    DOB: 13-Jan-1956, 58 y.o.   MRN: [id number]  HPI  58 year old patient who presents with a two-week history of left wrist pain. He states that in 2000 he sustained trauma to the left wrist. 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 For the past 2 days pain has intensified. He is using a splint ibuprofen and hydrocodone  Past Medical History  Diagnosis Date  . Arthritis     History   Social History  . Marital Status: Married    Spouse Name: N/A    Number of Children: N/A  . Years of Education: N/A   Occupational History  . Not on file.   Social History Main Topics  . Smoking status: Former Smoker    Quit date: 03/25/1988  . Smokeless tobacco: Never Used  . Alcohol Use: No  . Drug Use: No  . Sexual Activity: Not on file   Other Topics Concern  . Not on file   Social History Narrative  . No narrative on file    Past Surgical History  Procedure Laterality Date  . Back surgery      x2  . Hernia repair    . Spine surgery      x2    History reviewed. No pertinent family history.  No Known Allergies  Current Outpatient Prescriptions on File Prior to Visit  Medication Sig Dispense Refill  . cyclobenzaprine (FLEXERIL) 10 MG tablet Take 1 tablet (10 mg total) by mouth 3 (three) times daily as needed.  30 tablet  6  . HYDROcodone-acetaminophen (NORCO) 10-325 MG per tablet Take 1 tablet by mouth every 6 (six) hours as needed.  120 tablet  0  . sildenafil (VIAGRA) 100 MG tablet Take 1 tablet (100 mg total) by mouth daily as needed for erectile dysfunction.  6 tablet  6  . traZODone (DESYREL) 50 MG tablet Take 0.5-1 tablets (25-50 mg total) by mouth at bedtime as needed for sleep.  30 tablet  3   No current facility-administered medications on file prior to visit.    BP 140/80  Pulse 86  Temp(Src) 98.3 F (36.8 C) (Oral)  Resp 20  Ht 5\' 10"  (1.778 m)  Wt 197 lb (89.359  kg)  BMI 28.27 kg/m2  SpO2 98%       Review of Systems  Musculoskeletal: Positive for arthralgias.       Objective:   Physical Exam  Constitutional: He appears well-developed and well-nourished. No distress.  Musculoskeletal:  Left wrist was examined there seem to be some mild tenderness over the dorsal aspect of the wrist no soft tissue swelling or other inflammatory changes. He said the pain was severe with movement. Was able to flex and extend his left wrist          Assessment & Plan:   Probable left wrist tendinitis. We'll treat with Depo-Medrol 80. Continue ibuprofen wrist splint.

## 2013-05-17 ENCOUNTER — Telehealth: Payer: Self-pay | Admitting: Internal Medicine

## 2013-05-17 MED ORDER — HYDROCODONE-ACETAMINOPHEN 10-325 MG PO TABS: 1.0000 | ORAL_TABLET | Freq: Four times a day (QID) | ORAL | Status: DC | PRN

## 2013-05-17 NOTE — Telephone Encounter (Signed)
Left detailed message Rx ready for pickup, will be at the front desk. Rx printed and signed. 

## 2013-05-17 NOTE — Telephone Encounter (Signed)
Pt request refill HYDROcodone-acetaminophen (NORCO) 10-325 MG per tablet Pt is out of med.

## 2013-06-11 ENCOUNTER — Telehealth: Payer: Self-pay

## 2013-06-11 NOTE — Telephone Encounter (Signed)
Spoke to pt told him Rx will be ready on Monday. Pt verbalized understanding.

## 2013-06-11 NOTE — Telephone Encounter (Signed)
Error // ck °

## 2013-06-11 NOTE — Telephone Encounter (Signed)
Pt req refill HYDROcodone-acetaminophen (NORCO) 10-325 MG per tablet

## 2013-06-14 MED ORDER — HYDROCODONE-ACETAMINOPHEN 10-325 MG PO TABS: 1.0000 | ORAL_TABLET | Freq: Four times a day (QID) | ORAL | Status: DC | PRN

## 2013-06-14 NOTE — Telephone Encounter (Signed)
Left detailed message Rx ready for pickup, will be at the front desk. Rx printed and signed. 

## 2013-07-08 ENCOUNTER — Telehealth: Payer: Self-pay | Admitting: Internal Medicine

## 2013-07-08 NOTE — Telephone Encounter (Signed)
Pt req that his rx for  HYDROcodone-acetaminophen (NORCO) 10-325 MG per tablet be a stronger dose .

## 2013-07-09 NOTE — Telephone Encounter (Signed)
Left message on voicemail to call office.  

## 2013-07-09 NOTE — Telephone Encounter (Signed)
Spoke to pt told him there is nothing stronger Dr. Raliegh Ip can write for him, you are already taking the highest dose of the Hydrocodone. Told pt he would have to make an appointment to discuss pain medication if it is not helping to get something else. Pt verbalized understanding and will call back Monday for an appointment.

## 2013-07-09 NOTE — Telephone Encounter (Signed)
Pt returned your call. pls advise

## 2013-07-13 ENCOUNTER — Telehealth: Payer: Self-pay | Admitting: Internal Medicine

## 2013-07-13 MED ORDER — HYDROCODONE-ACETAMINOPHEN 10-325 MG PO TABS: 1.0000 | ORAL_TABLET | Freq: Four times a day (QID) | ORAL | Status: DC | PRN

## 2013-07-13 NOTE — Telephone Encounter (Signed)
Pt needs new rx hydrocodone thanks donna

## 2013-07-13 NOTE — Telephone Encounter (Signed)
Left detailed message Rx ready for pickup. Rx printed and signed. 

## 2013-08-11 ENCOUNTER — Telehealth: Payer: Self-pay | Admitting: Internal Medicine

## 2013-08-11 MED ORDER — HYDROCODONE-ACETAMINOPHEN 10-325 MG PO TABS: 1.0000 | ORAL_TABLET | Freq: Four times a day (QID) | ORAL | Status: DC | PRN

## 2013-08-11 NOTE — Telephone Encounter (Signed)
Pt needs new rx hydrocodone °

## 2013-08-11 NOTE — Telephone Encounter (Signed)
Pt notified Rx ready for pickup. Rx printed and signed.  

## 2013-09-09 ENCOUNTER — Telehealth: Payer: Self-pay | Admitting: Internal Medicine

## 2013-09-09 MED ORDER — HYDROCODONE-ACETAMINOPHEN 10-325 MG PO TABS
1.0000 | ORAL_TABLET | Freq: Four times a day (QID) | ORAL | Status: DC | PRN
Start: 2013-09-09 — End: 2013-10-07

## 2013-09-09 NOTE — Telephone Encounter (Signed)
Pt needs new rx  hydrocodone . Pt has 2 pills left

## 2013-09-09 NOTE — Telephone Encounter (Signed)
Left detailed message Rx ready for pickup. Rx printed and signed. 

## 2013-10-05 ENCOUNTER — Telehealth: Payer: Self-pay | Admitting: Internal Medicine

## 2013-10-05 NOTE — Telephone Encounter (Signed)
Ok;  notified no early refills

## 2013-10-05 NOTE — Telephone Encounter (Signed)
Please advise, pt has been getting 120 tablets, should have enough till Thurs.

## 2013-10-05 NOTE — Telephone Encounter (Signed)
Pt req rx on HYDROcodone-acetaminophen (NORCO) 10-325 MG per tablet  Pt said he is out of med

## 2013-10-07 MED ORDER — HYDROCODONE-ACETAMINOPHEN 10-325 MG PO TABS: 1.0000 | ORAL_TABLET | Freq: Four times a day (QID) | ORAL | Status: DC | PRN

## 2013-10-07 NOTE — Telephone Encounter (Signed)
Left detailed message Rx ready for pickup. Rx printed and signed. 

## 2013-11-04 ENCOUNTER — Telehealth: Payer: Self-pay | Admitting: Internal Medicine

## 2013-11-04 NOTE — Telephone Encounter (Signed)
Pt needs new hydrocodone. Pt is aware md out of office. Pt will be out on 16 th

## 2013-11-04 NOTE — Telephone Encounter (Signed)
Pt notified Dr. Raliegh Ip is out till Monday will not have Rx till then. Pt verbalized understanding.

## 2013-11-08 MED ORDER — HYDROCODONE-ACETAMINOPHEN 10-325 MG PO TABS: 1.0000 | ORAL_TABLET | Freq: Four times a day (QID) | ORAL | Status: DC | PRN

## 2013-11-08 NOTE — Telephone Encounter (Signed)
Pt notified Rx ready for pickup. Rx printed and signed.  

## 2013-12-06 ENCOUNTER — Telehealth: Payer: Self-pay | Admitting: Internal Medicine

## 2013-12-06 NOTE — Telephone Encounter (Signed)
Pt needs new hydrocodone. Pt is out. Pt is aware may take up to 3 business days

## 2013-12-06 NOTE — Telephone Encounter (Signed)
Please call pt and schedule appointment per Dr. Raliegh Ip this week for pain management.

## 2013-12-06 NOTE — Telephone Encounter (Signed)
Requires return office visit to discuss pain medication use

## 2013-12-06 NOTE — Telephone Encounter (Signed)
Pt has been sch

## 2013-12-06 NOTE — Telephone Encounter (Signed)
Please review and advise. No Contract or Urine Drug screen done on pt.

## 2013-12-07 ENCOUNTER — Encounter: Payer: Self-pay | Admitting: Internal Medicine

## 2013-12-07 ENCOUNTER — Encounter: Payer: Self-pay | Admitting: *Deleted

## 2013-12-07 ENCOUNTER — Ambulatory Visit (INDEPENDENT_AMBULATORY_CARE_PROVIDER_SITE_OTHER): Payer: Self-pay | Admitting: Internal Medicine

## 2013-12-07 VITALS — BP 120/72 | HR 93 | Temp 98.3°F | Resp 20 | Ht 70.0 in | Wt 191.0 lb

## 2013-12-07 DIAGNOSIS — G8929 Other chronic pain: Secondary | ICD-10-CM

## 2013-12-07 DIAGNOSIS — F1110 Opioid abuse, uncomplicated: Secondary | ICD-10-CM

## 2013-12-07 DIAGNOSIS — M545 Low back pain, unspecified: Secondary | ICD-10-CM

## 2013-12-07 DIAGNOSIS — F1190 Opioid use, unspecified, uncomplicated: Secondary | ICD-10-CM | POA: Insufficient documentation

## 2013-12-07 MED ORDER — HYDROCODONE-ACETAMINOPHEN 10-325 MG PO TABS: 1.0000 | ORAL_TABLET | Freq: Four times a day (QID) | ORAL | Status: DC | PRN

## 2013-12-07 NOTE — Patient Instructions (Addendum)
Chronic Back Pain  When back pain lasts longer than 3 months, it is called chronic back pain.People with chronic back pain often go through certain periods that are more intense (flare-ups).  CAUSES Chronic back pain can be caused by wear and tear (degeneration) on different structures in your back. These structures include:  The bones of your spine (vertebrae) and the joints surrounding your spinal cord and nerve roots (facets).  The strong, fibrous tissues that connect your vertebrae (ligaments). Degeneration of these structures may result in pressure on your nerves. This can lead to constant pain. HOME CARE INSTRUCTIONS  Avoid bending, heavy lifting, prolonged sitting, and activities which make the problem worse.  Take brief periods of rest throughout the day to reduce your pain. Lying down or standing usually is better than sitting while you are resting.  Take over-the-counter or prescription medicines only as directed by your caregiver. SEEK IMMEDIATE MEDICAL CARE IF:   You have weakness or numbness in one of your legs or feet.  You have trouble controlling your bladder or bowels.  You have nausea, vomiting, abdominal pain, shortness of breath, or fainting. Document Released: 04/18/2004 Document Revised: 06/03/2011 Document Reviewed: 02/23/2011 [facility] Patient Information 2015 [place], [place]. This information is not intended to replace advice given to you by your health care provider. Make sure you discuss any questions you have with your health care provider. Chronic Pain Chronic pain can be defined as pain that is off and on and lasts for 3-6 months or longer. Many things cause chronic pain, which can make it difficult to make a diagnosis. There are many treatment options available for chronic pain. However, finding a treatment that works well for you may require trying various approaches until the right one is found. Many people benefit from a combination of two or more types of  treatment to control their pain. SYMPTOMS  Chronic pain can occur anywhere in the body and can range from mild to very severe. Some types of chronic pain include:  Headache.  Low back pain.  Cancer pain.  Arthritis pain.  Neurogenic pain. This is pain resulting from damage to nerves. People with chronic pain may also have other symptoms such as:  Depression.  Anger.  Insomnia.  Anxiety. DIAGNOSIS  Your health care provider will help diagnose your condition over time. In many cases, the initial focus will be on excluding possible conditions that could be causing the pain. Depending on your symptoms, your health care provider may order tests to diagnose your condition. Some of these tests may include:   Blood tests.   CT scan.   MRI.   X-rays.   Ultrasounds.   Nerve conduction studies.  You may need to see a specialist.  TREATMENT  Finding treatment that works well may take time. You may be referred to a pain specialist. He or she may prescribe medicine or therapies, such as:   Mindful meditation or yoga.  Shots (injections) of numbing or pain-relieving medicines into the spine or area of pain.  Local electrical stimulation.  Acupuncture.   Massage therapy.   Aroma, color, light, or sound therapy.   Biofeedback.   Working with a physical therapist to keep from getting stiff.   Regular, gentle exercise.   Cognitive or behavioral therapy.   Group support.  Sometimes, surgery may be recommended.  HOME CARE INSTRUCTIONS   Take all medicines as directed by your health care provider.   Lessen stress in your life by relaxing and doing things  such as listening to calming music.   Exercise or be active as directed by your health care provider.   Eat a healthy diet and include things such as vegetables, fruits, fish, and lean meats in your diet.   Keep all follow-up appointments with your health care provider.   Attend a support group  with others suffering from chronic pain. SEEK MEDICAL CARE IF:   Your pain gets worse.   You develop a new pain that was not there before.   You cannot tolerate medicines given to you by your health care provider.   You have new symptoms since your last visit with your health care provider.  SEEK IMMEDIATE MEDICAL CARE IF:   You feel weak.   You have decreased sensation or numbness.   You lose control of bowel or bladder function.   Your pain suddenly gets much worse.   You develop shaking.  You develop chills.  You develop confusion.  You develop chest pain.  You develop shortness of breath.  MAKE SURE YOU:  Understand these instructions.  Will watch your condition.  Will get help right away if you are not doing well or get worse. Document Released: 12/01/2001 Document Revised: 11/11/2012 Document Reviewed: 09/04/2012 [facility] Patient Information 2015 [place], [place]. This information is not intended to replace advice given to you by your health care provider. Make sure you discuss any questions you have with your health care provider. Chronic Pain Chronic pain can be defined as pain that is off and on and lasts for 3-6 months or longer. Many things cause chronic pain, which can make it difficult to make a diagnosis. There are many treatment options available for chronic pain. However, finding a treatment that works well for you may require trying various approaches until the right one is found. Many people benefit from a combination of two or more types of treatment to control their pain. SYMPTOMS  Chronic pain can occur anywhere in the body and can range from mild to very severe. Some types of chronic pain include:  Headache.  Low back pain.  Cancer pain.  Arthritis pain.  Neurogenic pain. This is pain resulting from damage to nerves. People with chronic pain may also have other symptoms such  as:  Depression.  Anger.  Insomnia.  Anxiety. DIAGNOSIS  Your health care provider will help diagnose your condition over time. In many cases, the initial focus will be on excluding possible conditions that could be causing the pain. Depending on your symptoms, your health care provider may order tests to diagnose your condition. Some of these tests may include:   Blood tests.   CT scan.   MRI.   X-rays.   Ultrasounds.   Nerve conduction studies.  You may need to see a specialist.  TREATMENT  Finding treatment that works well may take time. You may be referred to a pain specialist. He or she may prescribe medicine or therapies, such as:   Mindful meditation or yoga.  Shots (injections) of numbing or pain-relieving medicines into the spine or area of pain.  Local electrical stimulation.  Acupuncture.   Massage therapy.   Aroma, color, light, or sound therapy.   Biofeedback.   Working with a physical therapist to keep from getting stiff.   Regular, gentle exercise.   Cognitive or behavioral therapy.   Group support.  Sometimes, surgery may be recommended.  HOME CARE INSTRUCTIONS   Take all medicines as directed by your health care provider.   Lessen  stress in your life by relaxing and doing things such as listening to calming music.   Exercise or be active as directed by your health care provider.   Eat a healthy diet and include things such as vegetables, fruits, fish, and lean meats in your diet.   Keep all follow-up appointments with your health care provider.   Attend a support group with others suffering from chronic pain. SEEK MEDICAL CARE IF:   Your pain gets worse.   You develop a new pain that was not there before.   You cannot tolerate medicines given to you by your health care provider.   You have new symptoms since your last visit with your health care provider.  SEEK IMMEDIATE MEDICAL CARE IF:   You feel weak.    You have decreased sensation or numbness.   You lose control of bowel or bladder function.   Your pain suddenly gets much worse.   You develop shaking.  You develop chills.  You develop confusion.  You develop chest pain.  You develop shortness of breath.  MAKE SURE YOU:  Understand these instructions.  Will watch your condition.  Will get help right away if you are not doing well or get worse. Document Released: 12/01/2001 Document Revised: 11/11/2012 Document Reviewed: 09/04/2012 [facility] Patient Information 2015 [place], [place]. This information is not intended to replace advice given to you by your health care provider. Make sure you discuss any questions you have with your health care provider. Chronic Pain Chronic pain can be defined as pain that is off and on and lasts for 3-6 months or longer. Many things cause chronic pain, which can make it difficult to make a diagnosis. There are many treatment options available for chronic pain. However, finding a treatment that works well for you may require trying various approaches until the right one is found. Many people benefit from a combination of two or more types of treatment to control their pain. SYMPTOMS  Chronic pain can occur anywhere in the body and can range from mild to very severe. Some types of chronic pain include:  Headache.  Low back pain.  Cancer pain.  Arthritis pain.  Neurogenic pain. This is pain resulting from damage to nerves. People with chronic pain may also have other symptoms such as:  Depression.  Anger.  Insomnia.  Anxiety. DIAGNOSIS  Your health care provider will help diagnose your condition over time. In many cases, the initial focus will be on excluding possible conditions that could be causing the pain. Depending on your symptoms, your health care provider may order tests to diagnose your condition. Some of these tests may include:   Blood tests.   CT scan.   MRI.    X-rays.   Ultrasounds.   Nerve conduction studies.  You may need to see a specialist.  TREATMENT  Finding treatment that works well may take time. You may be referred to a pain specialist. He or she may prescribe medicine or therapies, such as:   Mindful meditation or yoga.  Shots (injections) of numbing or pain-relieving medicines into the spine or area of pain.  Local electrical stimulation.  Acupuncture.   Massage therapy.   Aroma, color, light, or sound therapy.   Biofeedback.   Working with a physical therapist to keep from getting stiff.   Regular, gentle exercise.   Cognitive or behavioral therapy.   Group support.  Sometimes, surgery may be recommended.  HOME CARE INSTRUCTIONS   Take all medicines as  directed by your health care provider.   Lessen stress in your life by relaxing and doing things such as listening to calming music.   Exercise or be active as directed by your health care provider.   Eat a healthy diet and include things such as vegetables, fruits, fish, and lean meats in your diet.   Keep all follow-up appointments with your health care provider.   Attend a support group with others suffering from chronic pain. SEEK MEDICAL CARE IF:   Your pain gets worse.   You develop a new pain that was not there before.   You cannot tolerate medicines given to you by your health care provider.   You have new symptoms since your last visit with your health care provider.  SEEK IMMEDIATE MEDICAL CARE IF:   You feel weak.   You have decreased sensation or numbness.   You lose control of bowel or bladder function.   Your pain suddenly gets much worse.   You develop shaking.  You develop chills.  You develop confusion.  You develop chest pain.  You develop shortness of breath.  MAKE SURE YOU:  Understand these instructions.  Will watch your condition.  Will get help right away if you are not doing well or  get worse. Document Released: 12/01/2001 Document Revised: 11/11/2012 Document Reviewed: 09/04/2012 [facility] Patient Information 2015 [place], [place]. This information is not intended to replace advice given to you by your health care provider. Make sure you discuss any questions you have with your health care provider. Chronic Pain Chronic pain can be defined as pain that is off and on and lasts for 3-6 months or longer. Many things cause chronic pain, which can make it difficult to make a diagnosis. There are many treatment options available for chronic pain. However, finding a treatment that works well for you may require trying various approaches until the right one is found. Many people benefit from a combination of two or more types of treatment to control their pain. SYMPTOMS  Chronic pain can occur anywhere in the body and can range from mild to very severe. Some types of chronic pain include:  Headache.  Low back pain.  Cancer pain.  Arthritis pain.  Neurogenic pain. This is pain resulting from damage to nerves. People with chronic pain may also have other symptoms such as:  Depression.  Anger.  Insomnia.  Anxiety. DIAGNOSIS  Your health care provider will help diagnose your condition over time. In many cases, the initial focus will be on excluding possible conditions that could be causing the pain. Depending on your symptoms, your health care provider may order tests to diagnose your condition. Some of these tests may include:   Blood tests.   CT scan.   MRI.   X-rays.   Ultrasounds.   Nerve conduction studies.  You may need to see a specialist.  TREATMENT  Finding treatment that works well may take time. You may be referred to a pain specialist. He or she may prescribe medicine or therapies, such as:   Mindful meditation or yoga.  Shots (injections) of numbing or pain-relieving medicines into the spine or area of pain.  Local electrical  stimulation.  Acupuncture.   Massage therapy.   Aroma, color, light, or sound therapy.   Biofeedback.   Working with a physical therapist to keep from getting stiff.   Regular, gentle exercise.   Cognitive or behavioral therapy.   Group support.  Sometimes, surgery may be recommended.  HOME CARE INSTRUCTIONS   Take all medicines as directed by your health care provider.   Lessen stress in your life by relaxing and doing things such as listening to calming music.   Exercise or be active as directed by your health care provider.   Eat a healthy diet and include things such as vegetables, fruits, fish, and lean meats in your diet.   Keep all follow-up appointments with your health care provider.   Attend a support group with others suffering from chronic pain. SEEK MEDICAL CARE IF:   Your pain gets worse.   You develop a new pain that was not there before.   You cannot tolerate medicines given to you by your health care provider.   You have new symptoms since your last visit with your health care provider.  SEEK IMMEDIATE MEDICAL CARE IF:   You feel weak.   You have decreased sensation or numbness.   You lose control of bowel or bladder function.   Your pain suddenly gets much worse.   You develop shaking.  You develop chills.  You develop confusion.  You develop chest pain.  You develop shortness of breath.  MAKE SURE YOU:  Understand these instructions.  Will watch your condition.  Will get help right away if you are not doing well or get worse. Document Released: 12/01/2001 Document Revised: 11/11/2012 Document Reviewed: 09/04/2012 [facility] Patient Information 2015 [place], [place]. This information is not intended to replace advice given to you by your health care provider. Make sure you discuss any questions you have with your health care provider. Chronic Pain Chronic pain can be defined as pain that is off and on and lasts  for 3-6 months or longer. Many things cause chronic pain, which can make it difficult to make a diagnosis. There are many treatment options available for chronic pain. However, finding a treatment that works well for you may require trying various approaches until the right one is found. Many people benefit from a combination of two or more types of treatment to control their pain. SYMPTOMS  Chronic pain can occur anywhere in the body and can range from mild to very severe. Some types of chronic pain include:  Headache.  Low back pain.  Cancer pain.  Arthritis pain.  Neurogenic pain. This is pain resulting from damage to nerves. People with chronic pain may also have other symptoms such as:  Depression.  Anger.  Insomnia.  Anxiety. DIAGNOSIS  Your health care provider will help diagnose your condition over time. In many cases, the initial focus will be on excluding possible conditions that could be causing the pain. Depending on your symptoms, your health care provider may order tests to diagnose your condition. Some of these tests may include:   Blood tests.   CT scan.   MRI.   X-rays.   Ultrasounds.   Nerve conduction studies.  You may need to see a specialist.  TREATMENT  Finding treatment that works well may take time. You may be referred to a pain specialist. He or she may prescribe medicine or therapies, such as:   Mindful meditation or yoga.  Shots (injections) of numbing or pain-relieving medicines into the spine or area of pain.  Local electrical stimulation.  Acupuncture.   Massage therapy.   Aroma, color, light, or sound therapy.   Biofeedback.   Working with a physical therapist to keep from getting stiff.   Regular, gentle exercise.   Cognitive or behavioral therapy.   Group  support.  Sometimes, surgery may be recommended.  HOME CARE INSTRUCTIONS   Take all medicines as directed by your health care provider.   Lessen  stress in your life by relaxing and doing things such as listening to calming music.   Exercise or be active as directed by your health care provider.   Eat a healthy diet and include things such as vegetables, fruits, fish, and lean meats in your diet.   Keep all follow-up appointments with your health care provider.   Attend a support group with others suffering from chronic pain. SEEK MEDICAL CARE IF:   Your pain gets worse.   You develop a new pain that was not there before.   You cannot tolerate medicines given to you by your health care provider.   You have new symptoms since your last visit with your health care provider.  SEEK IMMEDIATE MEDICAL CARE IF:   You feel weak.   You have decreased sensation or numbness.   You lose control of bowel or bladder function.   Your pain suddenly gets much worse.   You develop shaking.  You develop chills.  You develop confusion.  You develop chest pain.  You develop shortness of breath.  MAKE SURE YOU:  Understand these instructions.  Will watch your condition.  Will get help right away if you are not doing well or get worse. Document Released: 12/01/2001 Document Revised: 11/11/2012 Document Reviewed: 09/04/2012 [facility] Patient Information 2015 [place], [place]. This information is not intended to replace advice given to you by your health care provider. Make sure you discuss any questions you have with your health care provider. Chronic Pain Chronic pain can be defined as pain that is off and on and lasts for 3-6 months or longer. Many things cause chronic pain, which can make it difficult to make a diagnosis. There are many treatment options available for chronic pain. However, finding a treatment that works well for you may require trying various approaches until the right one is found. Many people benefit from a combination of two or more types of treatment to control their pain. SYMPTOMS  Chronic pain can  occur anywhere in the body and can range from mild to very severe. Some types of chronic pain include:  Headache.  Low back pain.  Cancer pain.  Arthritis pain.  Neurogenic pain. This is pain resulting from damage to nerves. People with chronic pain may also have other symptoms such as:  Depression.  Anger.  Insomnia.  Anxiety. DIAGNOSIS  Your health care provider will help diagnose your condition over time. In many cases, the initial focus will be on excluding possible conditions that could be causing the pain. Depending on your symptoms, your health care provider may order tests to diagnose your condition. Some of these tests may include:   Blood tests.   CT scan.   MRI.   X-rays.   Ultrasounds.   Nerve conduction studies.  You may need to see a specialist.  TREATMENT  Finding treatment that works well may take time. You may be referred to a pain specialist. He or she may prescribe medicine or therapies, such as:   Mindful meditation or yoga.  Shots (injections) of numbing or pain-relieving medicines into the spine or area of pain.  Local electrical stimulation.  Acupuncture.   Massage therapy.   Aroma, color, light, or sound therapy.   Biofeedback.   Working with a physical therapist to keep from getting stiff.   Regular, gentle exercise.  Cognitive or behavioral therapy.   Group support.  Sometimes, surgery may be recommended.  HOME CARE INSTRUCTIONS   Take all medicines as directed by your health care provider.   Lessen stress in your life by relaxing and doing things such as listening to calming music.   Exercise or be active as directed by your health care provider.   Eat a healthy diet and include things such as vegetables, fruits, fish, and lean meats in your diet.   Keep all follow-up appointments with your health care provider.   Attend a support group with others suffering from chronic pain. SEEK MEDICAL CARE  IF:   Your pain gets worse.   You develop a new pain that was not there before.   You cannot tolerate medicines given to you by your health care provider.   You have new symptoms since your last visit with your health care provider.  SEEK IMMEDIATE MEDICAL CARE IF:   You feel weak.   You have decreased sensation or numbness.   You lose control of bowel or bladder function.   Your pain suddenly gets much worse.   You develop shaking.  You develop chills.  You develop confusion.  You develop chest pain.  You develop shortness of breath.  MAKE SURE YOU:  Understand these instructions.  Will watch your condition.  Will get help right away if you are not doing well or get worse. Document Released: 12/01/2001 Document Revised: 11/11/2012 Document Reviewed: 09/04/2012 [facility] Patient Information 2015 [place], [place]. This information is not intended to replace advice given to you by your health care provider. Make sure you discuss any questions you have with your health care provider.

## 2013-12-07 NOTE — Progress Notes (Signed)
Subjective:    Patient ID: Vincent [name], male    DOB: December 21, 1955, 58 y.o.   MRN: [id number]  HPI  58 year old patient who is seen today in followup.  He has history of chronic low back pain and is status post laminectomy x2.  He receives monthly prescriptions for hydrocodone.  He states that he remains quite functional and does work daily.  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  No health insurance.  He does have a signed pain contract  Past Medical History  Diagnosis Date  . Arthritis     History   Social History  . Marital Status: Married    Spouse Name: N/A    Number of Children: N/A  . Years of Education: N/A   Occupational History  . Not on file.   Social History Main Topics  . Smoking status: Former Smoker    Quit date: 03/25/1988  . Smokeless tobacco: Never Used  . Alcohol Use: No  . Drug Use: No  . Sexual Activity: Not on file   Other Topics Concern  . Not on file   Social History Narrative  . No narrative on file    Past Surgical History  Procedure Laterality Date  . Back surgery      x2  . Hernia repair    . Spine surgery      x2    No family history on file.  No Known Allergies  Current Outpatient Prescriptions on File Prior to Visit  Medication Sig Dispense Refill  . cyclobenzaprine (FLEXERIL) 10 MG tablet Take 1 tablet (10 mg total) by mouth 3 (three) times daily as needed.  30 tablet  6  . HYDROcodone-acetaminophen (NORCO) 10-325 MG per tablet Take 1 tablet by mouth every 6 (six) hours as needed.  120 tablet  0  . sildenafil (VIAGRA) 100 MG tablet Take 1 tablet (100 mg total) by mouth daily as needed for erectile dysfunction.  6 tablet  6   No current facility-administered medications on file prior to visit.    BP 120/72  Pulse 93  Temp(Src) 98.3 F (36.8 C) (Oral)  Resp 20  Ht 5\' 10"  (1.778 m)  Wt 191 lb (86.637 kg)  BMI 27.41 kg/m2  SpO2 98%     Review of Systems  Constitutional: Negative  for fever, chills, appetite change and fatigue.  HENT: Negative for congestion, dental problem, ear pain, hearing loss, sore throat, tinnitus, trouble swallowing and voice change.   Eyes: Negative for pain, discharge and visual disturbance.  Respiratory: Negative for cough, chest tightness, wheezing and stridor.   Cardiovascular: Negative for chest pain, palpitations and leg swelling.  Gastrointestinal: Negative for nausea, vomiting, abdominal pain, diarrhea, constipation, blood in stool and abdominal distention.  Genitourinary: Negative for urgency, hematuria, flank pain, discharge, difficulty urinating and genital sores.  Musculoskeletal: Positive for back pain. Negative for arthralgias, gait problem, joint swelling, myalgias and neck stiffness.  Skin: Negative for rash.  Neurological: Negative for dizziness, syncope, speech difficulty, weakness, numbness and headaches.  Hematological: Negative for adenopathy. Does not bruise/bleed easily.  Psychiatric/Behavioral: Negative for behavioral problems and dysphoric mood. The patient is not nervous/anxious.        Objective:   Physical Exam  Constitutional: He appears well-developed and well-nourished. No distress.  Blood pressure low normal          Assessment & Plan:   Chronic low back pain Chronic narcotic use  We'll check a UDS Medications  refilled 

## 2013-12-07 NOTE — Progress Notes (Signed)
Pre visit review using our clinic review tool, if applicable. No additional management support is needed unless otherwise documented below in the visit note. 

## 2013-12-16 ENCOUNTER — Encounter: Payer: Self-pay | Admitting: Internal Medicine

## 2014-01-05 ENCOUNTER — Telehealth: Payer: Self-pay | Admitting: Internal Medicine

## 2014-01-05 NOTE — Telephone Encounter (Signed)
Pt needs new rx hydrocodone °

## 2014-01-06 MED ORDER — HYDROCODONE-ACETAMINOPHEN 10-325 MG PO TABS: 1.0000 | ORAL_TABLET | Freq: Four times a day (QID) | ORAL | Status: DC | PRN

## 2014-01-06 NOTE — Telephone Encounter (Signed)
Left detailed message Rx ready for pickup. Rx printed and signed. 

## 2014-02-03 ENCOUNTER — Telehealth: Payer: Self-pay | Admitting: Internal Medicine

## 2014-02-03 MED ORDER — HYDROCODONE-ACETAMINOPHEN 10-325 MG PO TABS: 1.0000 | ORAL_TABLET | Freq: Four times a day (QID) | ORAL | Status: DC | PRN

## 2014-02-03 NOTE — Telephone Encounter (Signed)
Left detailed message Rx ready for pickup. Rx printed and signed. 

## 2014-02-03 NOTE — Telephone Encounter (Signed)
Pt needs refill on HYDROcodone-acetaminophen (NORCO) 10-325 MG per tablet. °

## 2014-03-07 ENCOUNTER — Telehealth: Payer: Self-pay | Admitting: Internal Medicine

## 2014-03-07 MED ORDER — HYDROCODONE-ACETAMINOPHEN 10-325 MG PO TABS: 1.0000 | ORAL_TABLET | Freq: Four times a day (QID) | ORAL | Status: DC | PRN

## 2014-03-07 NOTE — Telephone Encounter (Signed)
Patient needs a re-fill on HYDROcodone-acetaminophen (NORCO) 10-325 MG per tablet. Patient is aware Dr. Raliegh Ip is out of the office

## 2014-03-07 NOTE — Telephone Encounter (Signed)
Left detailed message Rx ready for pickup. Rx printed and signed by Dr. Todd. 

## 2014-04-04 ENCOUNTER — Telehealth: Payer: Self-pay | Admitting: Internal Medicine

## 2014-04-04 MED ORDER — CYCLOBENZAPRINE HCL 10 MG PO TABS: 10.0000 mg | ORAL_TABLET | Freq: Three times a day (TID) | ORAL | Status: DC | PRN

## 2014-04-04 NOTE — Telephone Encounter (Signed)
Patient need a re-fill on HYDROcodone-acetaminophen (NORCO) 10-325 MG per tablet and cyclobenzaprine (FLEXERIL) 10 MG tablet.  [place], [place]

## 2014-04-06 ENCOUNTER — Telehealth: Payer: Self-pay | Admitting: Internal Medicine

## 2014-04-06 NOTE — Telephone Encounter (Signed)
Pt following up on refill request HYDROcodone-acetaminophen (NORCO) 10-325 MG per tablet

## 2014-04-06 NOTE — Telephone Encounter (Signed)
Opened in error

## 2014-04-07 MED ORDER — HYDROCODONE-ACETAMINOPHEN 10-325 MG PO TABS: 1.0000 | ORAL_TABLET | Freq: Four times a day (QID) | ORAL | Status: DC | PRN

## 2014-04-07 NOTE — Telephone Encounter (Signed)
Left detailed message Rx ready for pickup. Rx printed and signed. 

## 2014-04-26 ENCOUNTER — Ambulatory Visit (INDEPENDENT_AMBULATORY_CARE_PROVIDER_SITE_OTHER): Payer: Self-pay | Admitting: Internal Medicine

## 2014-04-26 ENCOUNTER — Encounter: Payer: Self-pay | Admitting: Internal Medicine

## 2014-04-26 VITALS — BP 140/80 | HR 96 | Temp 98.6°F | Wt 190.0 lb

## 2014-04-26 DIAGNOSIS — M545 Low back pain: Secondary | ICD-10-CM

## 2014-04-26 DIAGNOSIS — M25512 Pain in left shoulder: Secondary | ICD-10-CM

## 2014-04-26 DIAGNOSIS — F1190 Opioid use, unspecified, uncomplicated: Secondary | ICD-10-CM

## 2014-04-26 DIAGNOSIS — G8929 Other chronic pain: Secondary | ICD-10-CM

## 2014-04-26 NOTE — Progress Notes (Signed)
Pre visit review using our clinic review tool, if applicable. No additional management support is needed unless otherwise documented below in the visit note. 
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
## 2014-04-26 NOTE — Patient Instructions (Signed)
You  may move around, but avoid painful motions and activities.  Apply heat  to the sore area for 15 to 20 minutes 3 or 4 times daily for the next two to 3 days.  Take over-the-counter or prescription medicines for pain, discomfort , such as Advil 2 tablets every 6 hours. Rest the injured area.

## 2014-05-06 ENCOUNTER — Telehealth: Payer: Self-pay | Admitting: Internal Medicine

## 2014-05-06 MED ORDER — HYDROCODONE-ACETAMINOPHEN 10-325 MG PO TABS: 1.0000 | ORAL_TABLET | Freq: Four times a day (QID) | ORAL | Status: DC | PRN

## 2014-05-06 NOTE — Telephone Encounter (Signed)
Left detailed message Rx ready for pickup. Rx printed and signed. 

## 2014-05-06 NOTE — Telephone Encounter (Signed)
Pt request refill HYDROcodone-acetaminophen (NORCO) 10-325 MG per tablet °

## 2014-06-06 ENCOUNTER — Telehealth: Payer: Self-pay | Admitting: Internal Medicine

## 2014-06-06 MED ORDER — HYDROCODONE-ACETAMINOPHEN 10-325 MG PO TABS: 1.0000 | ORAL_TABLET | Freq: Four times a day (QID) | ORAL | Status: DC | PRN

## 2014-06-06 NOTE — Telephone Encounter (Signed)
Pt needs new rx hydrocodone °

## 2014-06-06 NOTE — Telephone Encounter (Signed)
Left detailed message Rx ready for pickup, will be at the front desk. Rx printed and signed. 

## 2014-07-06 ENCOUNTER — Telehealth: Payer: Self-pay | Admitting: Internal Medicine

## 2014-07-06 NOTE — Telephone Encounter (Signed)
Pt needs new rx hydrocodone °

## 2014-07-07 MED ORDER — HYDROCODONE-ACETAMINOPHEN 10-325 MG PO TABS: 1.0000 | ORAL_TABLET | Freq: Four times a day (QID) | ORAL | Status: DC | PRN

## 2014-07-07 NOTE — Telephone Encounter (Signed)
Left detailed message Rx ready for pickup. Rx printed and signed by Dr.Todd.

## 2014-08-04 ENCOUNTER — Telehealth: Payer: Self-pay | Admitting: Internal Medicine

## 2014-08-04 NOTE — Telephone Encounter (Signed)
Pt request refill of the following: HYDROcodone-acetaminophen (NORCO) 10-325 MG per tablet ° ° °Phamacy: ° °

## 2014-08-05 MED ORDER — HYDROCODONE-ACETAMINOPHEN 10-325 MG PO TABS: 1.0000 | ORAL_TABLET | Freq: Four times a day (QID) | ORAL | Status: DC | PRN

## 2014-08-05 NOTE — Telephone Encounter (Signed)
Left detailed message Rx ready for pickup. Rx printed and signed. 

## 2014-09-05 ENCOUNTER — Telehealth: Payer: Self-pay | Admitting: Internal Medicine

## 2014-09-05 MED ORDER — HYDROCODONE-ACETAMINOPHEN 10-325 MG PO TABS: 1.0000 | ORAL_TABLET | Freq: Four times a day (QID) | ORAL | Status: DC | PRN

## 2014-09-05 NOTE — Telephone Encounter (Signed)
Left message Rx ready for pickup. Rx printed and signed.  

## 2014-09-05 NOTE — Telephone Encounter (Signed)
Pt needs new rx hydrocodone °

## 2014-10-05 ENCOUNTER — Telehealth: Payer: Self-pay | Admitting: Internal Medicine

## 2014-10-05 MED ORDER — HYDROCODONE-ACETAMINOPHEN 10-325 MG PO TABS: 1.0000 | ORAL_TABLET | Freq: Four times a day (QID) | ORAL | Status: DC | PRN

## 2014-10-05 NOTE — Telephone Encounter (Signed)
Pt needs new rx hydrocodone °

## 2014-10-05 NOTE — Telephone Encounter (Signed)
Left detailed message Rx ready for pickup, will be at the front desk. Rx printed and signed. 

## 2014-11-04 ENCOUNTER — Telehealth: Payer: Self-pay | Admitting: Internal Medicine

## 2014-11-04 NOTE — Telephone Encounter (Signed)
Refill ok? 

## 2014-11-04 NOTE — Telephone Encounter (Signed)
Pt request refill HYDROcodone-acetaminophen (NORCO) 10-325 MG per tablet °

## 2014-11-07 MED ORDER — HYDROCODONE-ACETAMINOPHEN 10-325 MG PO TABS: 1.0000 | ORAL_TABLET | Freq: Four times a day (QID) | ORAL | Status: DC | PRN

## 2014-11-07 NOTE — Telephone Encounter (Signed)
Pt notified Rx ready for pickup. Rx printed and signed.  

## 2014-11-07 NOTE — Telephone Encounter (Signed)
ok 

## 2014-12-07 ENCOUNTER — Telehealth: Payer: Self-pay | Admitting: Internal Medicine

## 2014-12-07 NOTE — Telephone Encounter (Signed)
Pt needs new rx hydrocodone °

## 2014-12-08 MED ORDER — HYDROCODONE-ACETAMINOPHEN 10-325 MG PO TABS: 1.0000 | ORAL_TABLET | Freq: Four times a day (QID) | ORAL | Status: DC | PRN

## 2014-12-08 NOTE — Telephone Encounter (Signed)
Pt notified Rx ready for pickup. Rx printed and signed.  

## 2015-01-05 ENCOUNTER — Other Ambulatory Visit: Payer: Self-pay | Admitting: Internal Medicine

## 2015-01-05 NOTE — Telephone Encounter (Signed)
Pt request refill of the following: HYDROcodone-acetaminophen (NORCO) 10-325 MG per tablet ° ° °Phamacy: ° °

## 2015-01-05 NOTE — Telephone Encounter (Signed)
Rx last filled 9.15.2016 #120.  Pt last seen on 2.2.2016. Pls advise.

## 2015-01-06 MED ORDER — HYDROCODONE-ACETAMINOPHEN 10-325 MG PO TABS: 1.0000 | ORAL_TABLET | Freq: Four times a day (QID) | ORAL | Status: DC | PRN

## 2015-01-06 NOTE — Telephone Encounter (Signed)
OK  Needs ROV

## 2015-01-06 NOTE — Telephone Encounter (Signed)
Called and left a message for pt that rx is ready for pickup.  Noted on envelope that pt needs follow up.

## 2015-02-03 ENCOUNTER — Telehealth: Payer: Self-pay | Admitting: Internal Medicine

## 2015-02-03 MED ORDER — HYDROCODONE-ACETAMINOPHEN 10-325 MG PO TABS: 1.0000 | ORAL_TABLET | Freq: Four times a day (QID) | ORAL | Status: DC | PRN

## 2015-02-03 NOTE — Telephone Encounter (Signed)
° ° °  Pt request refill of the following: ° ° °HYDROcodone-acetaminophen (NORCO) 10-325 MG tablet ° ° °Phamacy: °

## 2015-02-03 NOTE — Telephone Encounter (Signed)
Please call pt and schedule appt  prior to Dec 14 th before next Rx is needed per Dr. Raliegh Ip for medication follow up. Also please tell him Rx is ready for pickup will be at the front desk. Thanks.

## 2015-02-03 NOTE — Telephone Encounter (Signed)
Left message on voice mail  to call back

## 2015-02-03 NOTE — Telephone Encounter (Signed)
Pt aware Rx ready and pt has made his appointment to follow up with Dr Raliegh Ip on 12/09.

## 2015-03-03 ENCOUNTER — Encounter: Payer: Self-pay | Admitting: Internal Medicine

## 2015-03-03 ENCOUNTER — Ambulatory Visit (INDEPENDENT_AMBULATORY_CARE_PROVIDER_SITE_OTHER): Payer: Self-pay | Admitting: Internal Medicine

## 2015-03-03 VITALS — BP 130/80 | HR 96 | Temp 98.5°F | Ht 70.0 in | Wt 190.0 lb

## 2015-03-03 DIAGNOSIS — G8929 Other chronic pain: Secondary | ICD-10-CM

## 2015-03-03 DIAGNOSIS — F1190 Opioid use, unspecified, uncomplicated: Secondary | ICD-10-CM

## 2015-03-03 DIAGNOSIS — M545 Low back pain: Secondary | ICD-10-CM

## 2015-03-03 DIAGNOSIS — H6121 Impacted cerumen, right ear: Secondary | ICD-10-CM

## 2015-03-03 MED ORDER — HYDROCODONE-ACETAMINOPHEN 10-325 MG PO TABS: 1.0000 | ORAL_TABLET | Freq: Four times a day (QID) | ORAL | Status: DC | PRN

## 2015-03-03 NOTE — Patient Instructions (Signed)
Limit your sodium (Salt) intake    It is important that you exercise regularly, at least 20 minutes 3 to 4 times per week.  If you develop chest pain or shortness of breath seek  medical attention.  You need to lose weight.  Consider a lower calorie diet and regular exercise.  Return in one year for follow-up 

## 2015-03-03 NOTE — Progress Notes (Signed)
Subjective:    Patient ID: Vincent [name], male    DOB: 1955/09/19, 59 y.o.   MRN: [id number]  HPI  59 year old patient who has chronic low back pain.  He has a history of prior laminectomy times 2.  He has been fairly stable. Still no health insurance Still very active as a Development worker, community.  Vigorous activities due to aggravate his pain  Complains of decreased auditory acuity from the right ear that has been present for about one month  Past Medical History  Diagnosis Date  . Arthritis     Social History   Social History  . Marital Status: Married    Spouse Name: N/A  . Number of Children: N/A  . Years of Education: N/A   Occupational History  . Not on file.   Social History Main Topics  . Smoking status: Former Smoker    Quit date: 03/25/1988  . Smokeless tobacco: Never Used  . Alcohol Use: No  . Drug Use: No  . Sexual Activity: Not on file   Other Topics Concern  . Not on file   Social History Narrative    Past Surgical History  Procedure Laterality Date  . Back surgery      x2  . Hernia repair    . Spine surgery      x2    No family history on file.  No Known Allergies  Current Outpatient Prescriptions on File Prior to Visit  Medication Sig Dispense Refill  . cyclobenzaprine (FLEXERIL) 10 MG tablet Take 1 tablet (10 mg total) by mouth 3 (three) times daily as needed. 30 tablet 5  . HYDROcodone-acetaminophen (NORCO) 10-325 MG tablet Take 1 tablet by mouth every 6 (six) hours as needed. 120 tablet 0  . sildenafil (VIAGRA) 100 MG tablet Take 1 tablet (100 mg total) by mouth daily as needed for erectile dysfunction. 6 tablet 6   No current facility-administered medications on file prior to visit.    BP 130/80 mmHg  Pulse 96  Temp(Src) 98.5 F (36.9 C) (Oral)  Ht 5\' 10"  (1.778 m)  Wt 190 lb (86.183 kg)  BMI 27.26 kg/m2  SpO2 98%     Review of Systems  Constitutional: Negative for fever, chills, appetite change and fatigue.  HENT: Negative for  congestion, dental problem, ear pain, hearing loss, sore throat, tinnitus, trouble swallowing and voice change.   Eyes: Negative for pain, discharge and visual disturbance.  Respiratory: Negative for cough, chest tightness, wheezing and stridor.   Cardiovascular: Negative for chest pain, palpitations and leg swelling.  Gastrointestinal: Negative for nausea, vomiting, abdominal pain, diarrhea, constipation, blood in stool and abdominal distention.  Genitourinary: Negative for urgency, hematuria, flank pain, discharge, difficulty urinating and genital sores.  Musculoskeletal: Positive for back pain. Negative for myalgias, joint swelling, arthralgias, gait problem and neck stiffness.  Skin: Negative for rash.  Neurological: Negative for dizziness, syncope, speech difficulty, weakness, numbness and headaches.  Hematological: Negative for adenopathy. Does not bruise/bleed easily.  Psychiatric/Behavioral: Negative for behavioral problems and dysphoric mood. The patient is not nervous/anxious.   All other systems reviewed and are negative.      Objective:   Physical Exam  Constitutional: He is oriented to person, place, and time. He appears well-developed.  HENT:  Head: Normocephalic.  Right Ear: External ear normal.  Left Ear: External ear normal.  Cerumen impaction.  Right canal  Eyes: Conjunctivae and EOM are normal.  Neck: Normal range of motion.  Cardiovascular: Normal rate and normal heart  sounds.   Pulmonary/Chest: Breath sounds normal.  Abdominal: Bowel sounds are normal.  Musculoskeletal: Normal range of motion. He exhibits no edema or tenderness.  Neurological: He is alert and oriented to person, place, and time.  Psychiatric: He has a normal mood and affect. His behavior is normal.          Assessment & Plan:   Chronic low back pain.  Analgesics refilled Preventive health.  Obtaining health insurance coverage.  Discussed at length.  Patient needs annual exam colonoscopy,  etc. Cerumen impaction.  Right canal.  Now irrigated until clear  Medications updated CPX one year

## 2015-03-03 NOTE — Progress Notes (Signed)
Pre visit review using our clinic review tool, if applicable. No additional management support is needed unless otherwise documented below in the visit note. 

## 2015-04-03 ENCOUNTER — Telehealth: Payer: Self-pay | Admitting: Internal Medicine

## 2015-04-03 MED ORDER — HYDROCODONE-ACETAMINOPHEN 10-325 MG PO TABS: 1.0000 | ORAL_TABLET | Freq: Four times a day (QID) | ORAL | Status: DC | PRN

## 2015-04-03 NOTE — Telephone Encounter (Signed)
° ° °  Pt request refill of the following: ° ° °HYDROcodone-acetaminophen (NORCO) 10-325 MG tablet ° ° °Phamacy: °

## 2015-04-03 NOTE — Telephone Encounter (Signed)
Pt notified Rx ready for pickup. Rx printed and signed.  

## 2015-05-02 ENCOUNTER — Telehealth: Payer: Self-pay | Admitting: Internal Medicine

## 2015-05-02 MED ORDER — HYDROCODONE-ACETAMINOPHEN 10-325 MG PO TABS: 1.0000 | ORAL_TABLET | Freq: Four times a day (QID) | ORAL | Status: DC | PRN

## 2015-05-02 NOTE — Telephone Encounter (Signed)
Pt needs new rx hydrocodone °

## 2015-05-02 NOTE — Telephone Encounter (Signed)
Left message on voicemail Rx ready for pickup. Rx printed and signed.  

## 2015-05-06 ENCOUNTER — Emergency Department (INDEPENDENT_AMBULATORY_CARE_PROVIDER_SITE_OTHER)
Admission: EM | Admit: 2015-05-06 | Discharge: 2015-05-06 | Disposition: A | Discharge status: Home / Self Care | Payer: Self-pay | Source: Home / Self Care | Attending: Family Medicine | Admitting: Family Medicine

## 2015-05-06 ENCOUNTER — Encounter (HOSPITAL_COMMUNITY): Payer: Self-pay | Admitting: Emergency Medicine

## 2015-05-06 DIAGNOSIS — S61312A Laceration without foreign body of right middle finger with damage to nail, initial encounter: Secondary | ICD-10-CM

## 2015-05-06 NOTE — ED Notes (Addendum)
Patient c/o left middle finger cut onset about an hour ago. Patient reports he was opening a package and sliced his finger with the knife. Laceration is through his fingernail. Patient denies numbness. Patient reports last tetanus about 1 yea ago.

## 2015-05-06 NOTE — Discharge Instructions (Signed)
It was a pleasure to see you today.   The laceration was closed with steri-strips and Dermabond glue in the urgent care center.   Please keep covered for the next 24 hours. Keep dry and clean.   Follow up with your primary doctor at the beginning of next week.   Laceration Care, Adult A laceration is a cut that goes through all of the layers of the skin and into the tissue that is right under the skin. Some lacerations heal on their own. Others need to be closed with stitches (sutures), staples, skin adhesive strips, or skin glue. Proper laceration care minimizes the risk of infection and helps the laceration to heal better. HOW TO CARE FOR YOUR LACERATION If sutures or staples were used:  Keep the wound clean and dry.  If you were given a bandage (dressing), you should change it at least one time per day or as told by your health care provider. You should also change it if it becomes wet or dirty.  Keep the wound completely dry for the first 24 hours or as told by your health care provider. After that time, you may shower or bathe. However, make sure that the wound is not soaked in water until after the sutures or staples have been removed.  Clean the wound one time each day or as told by your health care provider:  Wash the wound with soap and water.  Rinse the wound with water to remove all soap.  Pat the wound dry with a clean towel. Do not rub the wound.  After cleaning the wound, apply a thin layer of antibiotic ointmentas told by your health care provider. This will help to prevent infection and keep the dressing from sticking to the wound.  Have the sutures or staples removed as told by your health care provider. If skin adhesive strips were used:  Keep the wound clean and dry.  If you were given a bandage (dressing), you should change it at least one time per day or as told by your health care provider. You should also change it if it becomes dirty or wet.  Do not get  the skin adhesive strips wet. You may shower or bathe, but be careful to keep the wound dry.  If the wound gets wet, pat it dry with a clean towel. Do not rub the wound.  Skin adhesive strips fall off on their own. You may trim the strips as the wound heals. Do not remove skin adhesive strips that are still stuck to the wound. They will fall off in time. If skin glue was used:  Try to keep the wound dry, but you may briefly wet it in the shower or bath. Do not soak the wound in water, such as by swimming.  After you have showered or bathed, gently pat the wound dry with a clean towel. Do not rub the wound.  Do not do any activities that will make you sweat heavily until the skin glue has fallen off on its own.  Do not apply liquid, cream, or ointment medicine to the wound while the skin glue is in place. Using those may loosen the film before the wound has healed.  If you were given a bandage (dressing), you should change it at least one time per day or as told by your health care provider. You should also change it if it becomes dirty or wet.  If a dressing is placed over the wound, be careful not  to apply tape directly over the skin glue. Doing that may cause the glue to be pulled off before the wound has healed.  Do not pick at the glue. The skin glue usually remains in place for 5-10 days, then it falls off of the skin. General Instructions  Take over-the-counter and prescription medicines only as told by your health care provider.  If you were prescribed an antibiotic medicine or ointment, take or apply it as told by your doctor. Do not stop using it even if your condition improves.  To help prevent scarring, make sure to cover your wound with sunscreen whenever you are outside after stitches are removed, after adhesive strips are removed, or when glue remains in place and the wound is healed. Make sure to wear a sunscreen of at least 30 SPF.  Do not scratch or pick at the  wound.  Keep all follow-up visits as told by your health care provider. This is important.  Check your wound every day for signs of infection. Watch for:  Redness, swelling, or pain.  Fluid, blood, or pus.  Raise (elevate) the injured area above the level of your heart while you are sitting or lying down, if possible. SEEK MEDICAL CARE IF:  You received a tetanus shot and you have swelling, severe pain, redness, or bleeding at the injection site.  You have a fever.  A wound that was closed breaks open.  You notice a bad smell coming from your wound or your dressing.  You notice something coming out of the wound, such as wood or glass.  Your pain is not controlled with medicine.  You have increased redness, swelling, or pain at the site of your wound.  You have fluid, blood, or pus coming from your wound.  You notice a change in the color of your skin near your wound.  You need to change the dressing frequently due to fluid, blood, or pus draining from the wound.  You develop a new rash.  You develop numbness around the wound. SEEK IMMEDIATE MEDICAL CARE IF:  You develop severe swelling around the wound.  Your pain suddenly increases and is severe.  You develop painful lumps near the wound or on skin that is anywhere on your body.  You have a red streak going away from your wound.  The wound is on your hand or foot and you cannot properly move a finger or toe.  The wound is on your hand or foot and you notice that your fingers or toes look pale or bluish.   This information is not intended to replace advice given to you by your health care provider. Make sure you discuss any questions you have with your health care provider.   Document Released: 03/11/2005 Document Revised: 07/26/2014 Document Reviewed: 03/07/2014 Elsevier Interactive Patient Education [employer].

## 2015-05-06 NOTE — ED Provider Notes (Signed)
CSN: [id number]     Arrival date & time 05/06/15  1922 History   First MD Initiated Contact with Patient 05/06/15 2008     Chief Complaint  Patient presents with  . 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 Occurred about 1 hour before presentation. Applied pressure and presented to [facility].   Reports he has had tetanus shot in the past 1 year.   Otherwise in regular state of health.  Past Medical History  Diagnosis Date  . Arthritis    Past Surgical History  Procedure Laterality Date  . Back surgery      x2  . Hernia repair    . Spine surgery      x2   History reviewed. No pertinent family history. Social History  Substance Use Topics  . Smoking status: Former Smoker    Quit date: 03/25/1988  . Smokeless tobacco: Never Used  . Alcohol Use: No    Review of Systems  Constitutional: Negative for fever, diaphoresis, appetite change and fatigue.  All other systems reviewed and are negative.   Allergies  Review of patient's allergies indicates no known allergies.  Home Medications   Prior to Admission medications   Medication Sig Start Date End Date Taking? Authorizing Provider  cyclobenzaprine (FLEXERIL) 10 MG tablet Take 1 tablet (10 mg total) by mouth 3 (three) times daily as needed. 04/04/14   [name], MD  HYDROcodone-acetaminophen [facility]) 10-325 MG tablet Take 1 tablet by mouth every 6 (six) hours as needed. 05/02/15   [name], MD  sildenafil (VIAGRA) 100 MG tablet Take 1 tablet (100 mg total) by mouth daily as needed for erectile dysfunction. 08/21/12   [name], MD   Meds Ordered and Administered this Visit  Medications - No data to display  BP 135/84 mmHg  Pulse 90  Temp(Src) 98.6 F (37 C) (Oral)   SpO2 97% No data found.   Physical Exam  Constitutional: He appears well-developed and well-nourished. No distress.  Musculoskeletal:  RIGHT MIDDLE FINGER distal tip with laceration across part of the nail and distal aspect of finger. Brisk capillary refill. Well apposed edges.  Hemostasis preserved. Sensation grossly intact in distal tip of finger. Able to flex DIP joint.   Skin: He is not diaphoretic.    ED Course  Procedures (including critical care time)  Labs Review Labs Reviewed - No data to display  Imaging Review No results found.   Visual Acuity Review  Right Eye Distance:   Left Eye Distance:   Bilateral Distance:    Right Eye Near:   Left Eye Near:    Bilateral Near:      Procedure Note:  Wound rinsed in betadine soak, dried.  Steri-strips applied and covered with Dermabond.  Tolerated well.  Good hemostasis preserved.   MDM  No diagnosis found. Laceration RIGHT MIDDLE FINGER, steri-strips applied. Keep clean and dry. Follow up with primary doctor.  UTD Tetanus shot.   Dalbert Mayotte, MD    Willeen Niece, MD 05/06/15 2138

## 2015-05-18 ENCOUNTER — Telehealth: Payer: Self-pay | Admitting: Internal Medicine

## 2015-05-18 DIAGNOSIS — M545 Low back pain: Principal | ICD-10-CM

## 2015-05-18 DIAGNOSIS — G8929 Other chronic pain: Secondary | ICD-10-CM

## 2015-05-18 NOTE — Telephone Encounter (Signed)
Spoke to pt, told him order for referral to Neurosurgeon was done and someone will be contacting you to schedule an appt. Pt verbalized understanding.

## 2015-05-18 NOTE — Telephone Encounter (Signed)
ok 

## 2015-05-18 NOTE — Telephone Encounter (Signed)
Okay to send referral to Neurosurgeon?

## 2015-05-18 NOTE — Telephone Encounter (Signed)
Pt states he needs referral to a neuro surgeon to get  MRI within the [facility].  Pt states he went to [facility] neuro last time, but his dr has retired. Pt would like to go there.  Pt states something has happened and his back has gotten a lot worse.

## 2015-05-31 ENCOUNTER — Telehealth: Payer: Self-pay | Admitting: Internal Medicine

## 2015-05-31 MED ORDER — HYDROCODONE-ACETAMINOPHEN 10-325 MG PO TABS: 1.0000 | ORAL_TABLET | Freq: Four times a day (QID) | ORAL | Status: DC | PRN

## 2015-05-31 NOTE — Telephone Encounter (Signed)
Pt needs new hydrocodone °

## 2015-05-31 NOTE — Telephone Encounter (Signed)
Pt notified Rx ready for pickup. Rx printed and signed.  

## 2015-06-29 ENCOUNTER — Telehealth: Payer: Self-pay | Admitting: Internal Medicine

## 2015-06-29 NOTE — Telephone Encounter (Signed)
Pt need new Rx for Hydrocodone °

## 2015-06-30 MED ORDER — HYDROCODONE-ACETAMINOPHEN 10-325 MG PO TABS
1.0000 | ORAL_TABLET | Freq: Four times a day (QID) | ORAL | Status: DC | PRN
Start: 2015-06-30 — End: 2015-07-28

## 2015-06-30 NOTE — Telephone Encounter (Signed)
Left message on personal voicemail Rx ready for pickup, will be at the front desk. Rx printed and signed by Dr. Sarajane Jews

## 2015-07-19 ENCOUNTER — Telehealth: Payer: Self-pay | Admitting: Internal Medicine

## 2015-07-19 MED ORDER — CYCLOBENZAPRINE HCL 10 MG PO TABS: 10.0000 mg | ORAL_TABLET | Freq: Three times a day (TID) | ORAL | Status: DC | PRN

## 2015-07-19 NOTE — Telephone Encounter (Signed)
Pt would like a new rx flexeril send to [facility] [address] for leg and back pain

## 2015-07-19 NOTE — Telephone Encounter (Signed)
Left message on voicemail Rx sent to pharmacy.

## 2015-07-27 ENCOUNTER — Telehealth: Payer: Self-pay | Admitting: Internal Medicine

## 2015-07-27 NOTE — Telephone Encounter (Signed)
Pt request refill  HYDROcodone-acetaminophen (NORCO) 10-325 MG tablet  Pt hopes to pick up on Friday, renewal date 5/07.

## 2015-07-28 MED ORDER — HYDROCODONE-ACETAMINOPHEN 10-325 MG PO TABS: 1.0000 | ORAL_TABLET | Freq: Four times a day (QID) | ORAL | Status: DC | PRN

## 2015-07-28 NOTE — Telephone Encounter (Signed)
Left detailed message on personal voicemail Rx ready for pickup, will be at the front desk. Rx printed and signed.  

## 2015-08-28 ENCOUNTER — Telehealth: Payer: Self-pay | Admitting: Internal Medicine

## 2015-08-28 MED ORDER — HYDROCODONE-ACETAMINOPHEN 10-325 MG PO TABS: 1.0000 | ORAL_TABLET | Freq: Four times a day (QID) | ORAL | Status: DC | PRN

## 2015-08-28 NOTE — Telephone Encounter (Signed)
Pt request refill  °HYDROcodone-acetaminophen (NORCO) 10-325 MG tablet °

## 2015-08-28 NOTE — Telephone Encounter (Signed)
Left message on personal voicemail Rx ready for pickup, will be at the front desk. Rx printed and signed.

## 2015-09-25 ENCOUNTER — Telehealth: Payer: Self-pay | Admitting: Internal Medicine

## 2015-09-25 NOTE — Telephone Encounter (Signed)
Pt needs new rx hydrocodone °

## 2015-09-27 MED ORDER — HYDROCODONE-ACETAMINOPHEN 10-325 MG PO TABS: 1.0000 | ORAL_TABLET | Freq: Four times a day (QID) | ORAL | Status: DC | PRN

## 2015-09-27 NOTE — Telephone Encounter (Signed)
Left message on voicemail, Rx ready for pickup, will be at the front desk. Rx printed and signed.

## 2015-10-25 ENCOUNTER — Telehealth: Payer: Self-pay | Admitting: Internal Medicine

## 2015-10-25 NOTE — Telephone Encounter (Signed)
Pt needs new rx hydrocodone °

## 2015-10-27 MED ORDER — HYDROCODONE-ACETAMINOPHEN 10-325 MG PO TABS: 1.0000 | ORAL_TABLET | Freq: Four times a day (QID) | ORAL | 0 refills | Status: DC | PRN

## 2015-10-27 NOTE — Telephone Encounter (Signed)
Okay to refill? 

## 2015-10-27 NOTE — Telephone Encounter (Signed)
Left voicemail letting patient know Rx is up front & ready for pick up. 

## 2015-10-27 NOTE — Telephone Encounter (Signed)
Rx printed for signature. 

## 2015-11-22 ENCOUNTER — Telehealth: Payer: Self-pay | Admitting: Internal Medicine

## 2015-11-22 NOTE — Telephone Encounter (Signed)
° ° °  Pt request refill of the following: ° ° °HYDROcodone-acetaminophen (NORCO) 10-325 MG tablet ° ° °Phamacy: °

## 2015-11-24 MED ORDER — HYDROCODONE-ACETAMINOPHEN 10-325 MG PO TABS: 1.0000 | ORAL_TABLET | Freq: Four times a day (QID) | ORAL | 0 refills | Status: DC | PRN

## 2015-11-24 NOTE — Telephone Encounter (Signed)
Left message on voicemail Rx ready for pickup will be at the front desk. Rx printed and signed.  

## 2015-12-25 ENCOUNTER — Telehealth: Payer: Self-pay | Admitting: Internal Medicine

## 2015-12-25 MED ORDER — HYDROCODONE-ACETAMINOPHEN 10-325 MG PO TABS: 1.0000 | ORAL_TABLET | Freq: Four times a day (QID) | ORAL | 0 refills | Status: DC | PRN

## 2015-12-25 NOTE — Telephone Encounter (Signed)
Left detailed message on personal voicemail Rx ready for pickup will be at the front desk. Also need to schedule physical for December. Rx printed and signed by Dr.Todd.

## 2015-12-25 NOTE — Telephone Encounter (Signed)
Pt needs new rx hydrocodone °

## 2016-01-09 ENCOUNTER — Telehealth: Payer: Self-pay | Admitting: Internal Medicine

## 2016-01-09 DIAGNOSIS — G8929 Other chronic pain: Secondary | ICD-10-CM

## 2016-01-09 DIAGNOSIS — M545 Low back pain: Principal | ICD-10-CM

## 2016-01-09 NOTE — Telephone Encounter (Signed)
Pt states [facility] will not work on him and advised pt to go to [facility]  Pt would like a referral to them.  Fax  [phone]  attn: Sabino Gasser pt

## 2016-01-09 NOTE — Telephone Encounter (Signed)
Neoma Laming, will you send new referral for pt to this place as requested.

## 2016-01-09 NOTE — Telephone Encounter (Signed)
Referral order put in

## 2016-01-11 ENCOUNTER — Telehealth: Payer: Self-pay | Admitting: Internal Medicine

## 2016-01-11 NOTE — Telephone Encounter (Signed)
Pt called back, told him okay to take Gabapentin it is not a controlled substance at this point and you are not violating your contract. Pt verbalized understanding.

## 2016-01-11 NOTE — Telephone Encounter (Signed)
Pt wanted to let you know that the surgeon prescribe him gabapentin and wanted to know if that would interfere with his contract with you for control substance.  Surgeon Dr. Marland Kitchen Ditty of [facility] wanted pt to check to make sure it is okay for him to have.  Pt would like to have Butch Penny to give him a call back.

## 2016-01-11 NOTE — Telephone Encounter (Signed)
Left message on voicemail to call office.  

## 2016-01-12 ENCOUNTER — Other Ambulatory Visit: Payer: Self-pay | Admitting: Neurological Surgery

## 2016-01-12 DIAGNOSIS — M5416 Radiculopathy, lumbar region: Secondary | ICD-10-CM

## 2016-01-12 DIAGNOSIS — T1590XA Foreign body on external eye, part unspecified, unspecified eye, initial encounter: Secondary | ICD-10-CM

## 2016-01-24 ENCOUNTER — Telehealth: Payer: Self-pay | Admitting: Internal Medicine

## 2016-01-24 ENCOUNTER — Other Ambulatory Visit: Payer: Self-pay

## 2016-01-24 MED ORDER — HYDROCODONE-ACETAMINOPHEN 10-325 MG PO TABS: 1.0000 | ORAL_TABLET | Freq: Four times a day (QID) | ORAL | 0 refills | Status: DC | PRN

## 2016-01-24 NOTE — Telephone Encounter (Signed)
° ° °  Pt request refill of the following: ° ° °HYDROcodone-acetaminophen (NORCO) 10-325 MG tablet ° ° °Phamacy: °

## 2016-01-24 NOTE — Telephone Encounter (Signed)
Pt notified Rx ready for pickup. Rx printed and signed.  

## 2016-02-23 ENCOUNTER — Ambulatory Visit (INDEPENDENT_AMBULATORY_CARE_PROVIDER_SITE_OTHER): Payer: Self-pay | Admitting: Internal Medicine

## 2016-02-23 ENCOUNTER — Encounter: Payer: Self-pay | Admitting: Internal Medicine

## 2016-02-23 VITALS — BP 156/80 | HR 100 | Temp 98.9°F | Resp 20 | Ht 70.0 in | Wt 190.0 lb

## 2016-02-23 DIAGNOSIS — F1190 Opioid use, unspecified, uncomplicated: Secondary | ICD-10-CM

## 2016-02-23 DIAGNOSIS — M545 Low back pain: Secondary | ICD-10-CM

## 2016-02-23 DIAGNOSIS — G8929 Other chronic pain: Secondary | ICD-10-CM

## 2016-02-23 MED ORDER — HYDROCODONE-ACETAMINOPHEN 10-325 MG PO TABS: 1.0000 | ORAL_TABLET | Freq: Four times a day (QID) | ORAL | 0 refills | Status: DC | PRN

## 2016-02-23 NOTE — Progress Notes (Signed)
Pre visit review using our clinic review tool, if applicable. No additional management support is needed unless otherwise documented below in the visit note. 

## 2016-02-23 NOTE — Patient Instructions (Signed)
Neurosurgery follow-up as scheduled  Return in 6 months for your annual exam

## 2016-02-23 NOTE — Progress Notes (Signed)
Subjective:    Patient ID: Vincent [name], male    DOB: 09/02/1955, 60 y.o.   MRN: [id number]  HPI  60 year old patient who is seen today for follow-up.he has a history of chronic intermittent low back pain and has had a recent flareup 2 weeks ago.  He was seen and followed by neurosurgery and has improved nicely.  He has had 2 prior back operations. Very pleased with his progress. An MRI was considered, but canceled due to  Past Medical History:  Diagnosis Date  . Arthritis      Social History   Social History  . Marital status: Married    Spouse name: N/A  . Number of children: N/A  . Years of education: N/A   Occupational History  . Not on file.   Social History Main Topics  . Smoking status: Former Smoker    Quit date: 03/25/1988  . Smokeless tobacco: Never Used  . Alcohol use No  . Drug use: No  . Sexual activity: Not on file   Other Topics Concern  . Not on file   Social History Narrative  . No narrative on file    Past Surgical History:  Procedure Laterality Date  . BACK SURGERY     x2  . HERNIA REPAIR    . SPINE SURGERY     x2    No family history on file.  No Known Allergies  Current Outpatient Prescriptions on File Prior to Visit  Medication Sig Dispense Refill  . cyclobenzaprine (FLEXERIL) 10 MG tablet Take 1 tablet (10 mg total) by mouth 3 (three) times daily as needed. 30 tablet 5  . sildenafil (VIAGRA) 100 MG tablet Take 1 tablet (100 mg total) by mouth daily as needed for erectile dysfunction. 6 tablet 6   No current facility-administered medications on file prior to visit.     BP (!) 156/80 (BP Location: Left Arm, Patient Position: Sitting, Cuff Size: Normal)   Pulse 100   Temp 98.9 F (37.2 C) (Oral)   Resp 20   Ht 5\' 10"  (1.778 m)   Wt 190 lb (86.2 kg)   SpO2 98%   BMI 27.26 kg/m   cost considerationsas   Review of Systems  Constitutional: Negative for appetite change, chills, fatigue and fever.  HENT: Negative for  congestion, dental problem, ear pain, hearing loss, sore throat, tinnitus, trouble swallowing and voice change.   Eyes: Negative for pain, discharge and visual disturbance.  Respiratory: Negative for cough, chest tightness, wheezing and stridor.   Cardiovascular: Negative for chest pain, palpitations and leg swelling.  Gastrointestinal: Negative for abdominal distention, abdominal pain, blood in stool, constipation, diarrhea, nausea and vomiting.  Genitourinary: Negative for difficulty urinating, discharge, flank pain, genital sores, hematuria and urgency.  Musculoskeletal: Positive for back pain. Negative for arthralgias, gait problem, joint swelling, myalgias and neck stiffness.  Skin: Negative for rash.  Neurological: Negative for dizziness, syncope, speech difficulty, weakness, numbness and headaches.  Hematological: Negative for adenopathy. Does not bruise/bleed easily.  Psychiatric/Behavioral: Negative for behavioral problems and dysphoric mood. The patient is not nervous/anxious.        Objective:   Physical Exam  Constitutional: He is oriented to person, place, and time. He appears well-developed.  HENT:  Head: Normocephalic.  Right Ear: External ear normal.  Left Ear: External ear normal.  Eyes: Conjunctivae and EOM are normal.  Neck: Normal range of motion.  Cardiovascular: Normal rate and normal heart sounds.   Pulmonary/Chest: Breath sounds normal.  Abdominal: Bowel sounds are normal.  Musculoskeletal: Normal range of motion. He exhibits no edema or tenderness.  Negative straight leg test No motor weakness Able to walk on toes and heels without difficulty  Neurological: He is alert and oriented to person, place, and time.  Psychiatric: He has a normal mood and affect. His behavior is normal.          Assessment & Plan:   Chronic low back pain.  Patient has had nice improvement after recent flare Analgesics refilled Follow-up neurosurgery  CPX 6  months  Jonnie Truxillo Pilar Plate

## 2016-03-20 ENCOUNTER — Telehealth: Payer: Self-pay | Admitting: Internal Medicine

## 2016-03-20 MED ORDER — HYDROCODONE-ACETAMINOPHEN 10-325 MG PO TABS: 1.0000 | ORAL_TABLET | Freq: Four times a day (QID) | ORAL | 0 refills | Status: DC | PRN

## 2016-03-20 NOTE — Telephone Encounter (Signed)
Pt request refill  °HYDROcodone-acetaminophen (NORCO) 10-325 MG tablet °

## 2016-03-21 NOTE — Telephone Encounter (Signed)
Pt notified Rx ready for pickup. Rx printed and signed by Dr. Burchette.  

## 2016-04-17 ENCOUNTER — Telehealth: Payer: Self-pay | Admitting: Internal Medicine

## 2016-04-17 NOTE — Telephone Encounter (Signed)
Pt needs new rx hydrocodone °

## 2016-04-18 ENCOUNTER — Other Ambulatory Visit: Payer: Self-pay | Admitting: Internal Medicine

## 2016-04-18 MED ORDER — HYDROCODONE-ACETAMINOPHEN 10-325 MG PO TABS: 1.0000 | ORAL_TABLET | Freq: Four times a day (QID) | ORAL | 0 refills | Status: DC | PRN

## 2016-04-18 NOTE — Telephone Encounter (Signed)
Too early to refill (last refilled on 03/20/16). Last office visit 03/02/16

## 2016-04-18 NOTE — Telephone Encounter (Signed)
Rx printed on 04/18/16 will be signed by Dr Raliegh Ip on 04/19/16 when he returns to the office.

## 2016-04-19 NOTE — Telephone Encounter (Signed)
Rx ready for pick up. Unable to reach pt so a voice message was left on phone to call office back

## 2016-05-20 ENCOUNTER — Other Ambulatory Visit: Payer: Self-pay | Admitting: Internal Medicine

## 2016-05-20 ENCOUNTER — Telehealth: Payer: Self-pay | Admitting: Internal Medicine

## 2016-05-20 MED ORDER — HYDROCODONE-ACETAMINOPHEN 10-325 MG PO TABS: 1.0000 | ORAL_TABLET | Freq: Four times a day (QID) | ORAL | 0 refills | Status: DC | PRN

## 2016-05-20 NOTE — Telephone Encounter (Signed)
Pt need new Rx for Hydrocodone   Pt is aware of 3 business days for refills. °

## 2016-05-20 NOTE — Telephone Encounter (Signed)
Pt notified Rx ready for pickup. Rx printed and signed.  

## 2016-06-12 ENCOUNTER — Emergency Department (HOSPITAL_COMMUNITY): Payer: No Typology Code available for payment source

## 2016-06-12 ENCOUNTER — Emergency Department (HOSPITAL_COMMUNITY)
Admission: EM | Admit: 2016-06-12 | Discharge: 2016-06-12 | Disposition: A | Discharge status: Home / Self Care | Payer: No Typology Code available for payment source | Attending: Emergency Medicine | Admitting: Emergency Medicine

## 2016-06-12 ENCOUNTER — Encounter (HOSPITAL_COMMUNITY): Payer: Self-pay

## 2016-06-12 DIAGNOSIS — Y999 Unspecified external cause status: Secondary | ICD-10-CM | POA: Insufficient documentation

## 2016-06-12 DIAGNOSIS — Z87891 Personal history of nicotine dependence: Secondary | ICD-10-CM | POA: Insufficient documentation

## 2016-06-12 DIAGNOSIS — Y92410 Unspecified street and highway as the place of occurrence of the external cause: Secondary | ICD-10-CM | POA: Diagnosis not present

## 2016-06-12 DIAGNOSIS — Y939 Activity, unspecified: Secondary | ICD-10-CM | POA: Diagnosis not present

## 2016-06-12 DIAGNOSIS — S161XXA Strain of muscle, fascia and tendon at neck level, initial encounter: Secondary | ICD-10-CM | POA: Diagnosis not present

## 2016-06-12 DIAGNOSIS — S199XXA Unspecified injury of neck, initial encounter: Secondary | ICD-10-CM | POA: Diagnosis present

## 2016-06-12 MED ORDER — CYCLOBENZAPRINE HCL 10 MG PO TABS: 10.0000 mg | ORAL_TABLET | Freq: Three times a day (TID) | ORAL | 0 refills | Status: DC | PRN

## 2016-06-12 MED ORDER — ONDANSETRON 4 MG PO TBDP
4.0000 mg | ORAL_TABLET | Freq: Once | ORAL | Status: AC | PRN
  Administered 2016-06-12: 4 mg via ORAL
  Filled 2016-06-12: qty 1

## 2016-06-12 MED ORDER — IBUPROFEN 800 MG PO TABS: 800.0000 mg | ORAL_TABLET | Freq: Three times a day (TID) | ORAL | 0 refills | Status: DC

## 2016-06-12 MED ORDER — OXYCODONE-ACETAMINOPHEN 5-325 MG PO TABS
1.0000 | ORAL_TABLET | Freq: Once | ORAL | Status: AC
  Administered 2016-06-12: 1 via ORAL
  Filled 2016-06-12: qty 1
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
## 2016-06-12 NOTE — ED Notes (Signed)
PT VOMITED X1.
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
## 2016-06-12 NOTE — ED Notes (Signed)
Patient was alert, oriented and stable upon discharge. RN went over AVS and patient had no further questions.  
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
## 2016-06-17 ENCOUNTER — Telehealth: Payer: Self-pay | Admitting: Internal Medicine

## 2016-06-17 MED ORDER — HYDROCODONE-ACETAMINOPHEN 10-325 MG PO TABS: 1.0000 | ORAL_TABLET | Freq: Four times a day (QID) | ORAL | 0 refills | Status: DC | PRN

## 2016-06-17 NOTE — Telephone Encounter (Signed)
Pt request refill  °HYDROcodone-acetaminophen (NORCO) 10-325 MG tablet °

## 2016-06-17 NOTE — Telephone Encounter (Signed)
Called pt to inform that Rx is ready for pick up. No answer, left message on voicemail to call office.

## 2016-07-18 ENCOUNTER — Telehealth: Payer: Self-pay | Admitting: Internal Medicine

## 2016-07-18 NOTE — Telephone Encounter (Signed)
Pt requesting a refill Rx   HYDROcodone-acetaminophen (NORCO) 10-325 MG tablet

## 2016-07-19 MED ORDER — HYDROCODONE-ACETAMINOPHEN 10-325 MG PO TABS: 1.0000 | ORAL_TABLET | Freq: Four times a day (QID) | ORAL | 0 refills | Status: DC | PRN

## 2016-07-19 NOTE — Telephone Encounter (Signed)
Pt notified Rx ready for pickup. Rx printed and signed.  

## 2016-07-19 NOTE — Telephone Encounter (Signed)
Rx printed awaiting to be signed.  

## 2016-08-14 ENCOUNTER — Telehealth: Payer: Self-pay | Admitting: Internal Medicine

## 2016-08-14 NOTE — Telephone Encounter (Signed)
Pt need new Rx for Hydrocodone   Pt is aware of 3 business days for refills and someone will call when ready for pick up. °

## 2016-08-15 NOTE — Telephone Encounter (Signed)
Last refilled Rx on 07/19/16, will print Rx after 08/18/16. Too soon to refill

## 2016-08-20 MED ORDER — HYDROCODONE-ACETAMINOPHEN 10-325 MG PO TABS: 1.0000 | ORAL_TABLET | Freq: Four times a day (QID) | ORAL | 0 refills | Status: DC | PRN

## 2016-08-20 NOTE — Telephone Encounter (Signed)
Rx printed awaiting to be signed.  

## 2016-08-20 NOTE — Telephone Encounter (Signed)
Pt notified Rx ready for pickup. Rx printed and signed.  

## 2016-08-20 NOTE — Telephone Encounter (Signed)
Pt following up on refill for  HYDROcodone-acetaminophen (NORCO) 10-325 MG tablet  Pt states it was due yesterday and he needs today. thanks

## 2016-09-20 ENCOUNTER — Ambulatory Visit: Payer: Self-pay | Admitting: Family Medicine

## 2016-09-20 ENCOUNTER — Ambulatory Visit (INDEPENDENT_AMBULATORY_CARE_PROVIDER_SITE_OTHER): Payer: Self-pay

## 2016-09-20 ENCOUNTER — Ambulatory Visit (HOSPITAL_COMMUNITY)
Admission: EM | Admit: 2016-09-20 | Discharge: 2016-09-20 | Disposition: A | Discharge status: Home / Self Care | Payer: Self-pay | Attending: Internal Medicine | Admitting: Internal Medicine

## 2016-09-20 ENCOUNTER — Emergency Department (HOSPITAL_COMMUNITY)
Admission: EM | Admit: 2016-09-20 | Discharge: 2016-09-21 | Disposition: A | Discharge status: Home / Self Care | Payer: Self-pay | Attending: Emergency Medicine | Admitting: Emergency Medicine

## 2016-09-20 ENCOUNTER — Encounter (HOSPITAL_COMMUNITY): Payer: Self-pay | Admitting: Emergency Medicine

## 2016-09-20 ENCOUNTER — Encounter (HOSPITAL_COMMUNITY): Payer: Self-pay

## 2016-09-20 DIAGNOSIS — M25562 Pain in left knee: Secondary | ICD-10-CM

## 2016-09-20 DIAGNOSIS — Z87891 Personal history of nicotine dependence: Secondary | ICD-10-CM | POA: Insufficient documentation

## 2016-09-20 MED ORDER — LIDOCAINE-EPINEPHRINE (PF) 2 %-1:200000 IJ SOLN
10.0000 mL | Freq: Once | INTRAMUSCULAR | Status: AC
  Administered 2016-09-20: 10 mL
  Filled 2016-09-20: qty 20

## 2016-09-20 NOTE — ED Provider Notes (Signed)
Vincent [name] Provider Note   CSN: [id number] Arrival date & time: 09/20/16  1914  By signing my name below, I, [name], attest that this documentation has been prepared under the direction and in the presence of non-physician practitioner, Delia Heady, PA-C. Electronically Signed: [name], Scribe. 09/20/2016. 8:51 PM.  History   Chief Complaint Chief Complaint  Patient presents with  . Knee Pain   The history is provided by the patient. No language interpreter was used.   HPI Comments: Vincent [name] is a 61 y.o. male with a PMHx of arthritis who presents to the Emergency Department complaining of constant moderate left knee pain that started about 1.5 months ago. He states he hit his left knee on a TV stand 1.5 months ago. No additional injury. His pain worsened yesterday with associated swelling. He was seen at Urgent Care, had X-ray done, and sent to the ED for possible MRI. He used ice and flexeril PTA without any relief. His pain is exacerbated by LLE movement and weight bearing. He able to ambulate and move LLE. He is currently on hydrocodone for chronic low back pain. No hx of gout or PE/DVT. Denies any fever, chest pain, SOB, numbness, or other complaints at this time.  Past Medical History:  Diagnosis Date  . Arthritis     Patient Active Problem List   Diagnosis Date Noted  . Narcotic drug use 12/07/2013  . Chronic low back pain 10/12/2010    Past Surgical History:  Procedure Laterality Date  . BACK SURGERY     x2  . HERNIA REPAIR    . SPINE SURGERY     x2       Home Medications    Prior to Admission medications   Medication Sig Start Date End Date Taking? Authorizing Provider  cyclobenzaprine (FLEXERIL) 10 MG tablet Take 1 tablet (10 mg total) by mouth 3 (three) times daily as needed for muscle spasms. 06/12/16   [name], PA-C  diclofenac (VOLTAREN) 75 MG EC tablet TK 1 T PO BID 02/18/16   [provider]  doxycycline  (VIBRAMYCIN) 100 MG capsule Take 1 capsule (100 mg total) by mouth 2 (two) times daily. 09/21/16 10/05/16  [name], PA-C  gabapentin (NEURONTIN) 300 MG capsule TK 1 C PO TID 02/20/16   [provider]  HYDROcodone-acetaminophen (NORCO) 10-325 MG tablet Take 1 tablet by mouth every 6 (six) hours as needed. 08/20/16   [name], MD  ibuprofen (ADVIL,MOTRIN) 800 MG tablet Take 1 tablet (800 mg total) by mouth 3 (three) times daily. 06/12/16   [name], PA-C  sildenafil (VIAGRA) 100 MG tablet Take 1 tablet (100 mg total) by mouth daily as needed for erectile dysfunction. 08/21/12   [name], MD    Family History No family history on file.  Social History Social History  Substance Use Topics  . Smoking status: Former Smoker    Quit date: 03/25/1988  . Smokeless tobacco: Never Used  . Alcohol use No     Allergies   Patient has no known allergies.   Review of Systems Review of Systems  Constitutional: Negative for fever.  Respiratory: Negative for shortness of breath.   Cardiovascular: Negative for chest pain.  Musculoskeletal: Positive for joint swelling (left knee) and myalgias (left knee).  Neurological: Negative for numbness.     Physical Exam Updated Vital Signs BP 122/78 (BP Location: Left Arm)   Pulse 88   Temp 98.1 F (36.7 C) (Oral)  Resp 18   Ht 5\' 11"  (1.803 m)   Wt 86.2 kg (190 lb)   SpO2 98%   BMI 26.50 kg/m   Physical Exam  Constitutional: He appears well-developed and well-nourished. No distress.  HENT:  Head: Normocephalic and atraumatic.  Eyes: Conjunctivae and EOM are normal. No scleral icterus.  Neck: Normal range of motion.  Pulmonary/Chest: Effort normal. No respiratory distress.  Musculoskeletal: He exhibits edema and tenderness.  Edema and tenderness along the lateral aspect of the left knee and below the patella. No temperature or color change noted. Pt reports pain with flexion, extension, and weight-bearing of  the knee. No visible wounds or bruises present. Sensation intact to light touch.  Neurological: He is alert. No sensory deficit. He exhibits normal muscle tone.  Skin: No rash noted. He is not diaphoretic.  Psychiatric: He has a normal mood and affect.  Nursing note and vitals reviewed.    ED Treatments / Results  DIAGNOSTIC STUDIES: Oxygen Saturation is 97% on RA, normal by my interpretation.    COORDINATION OF CARE: 8:55 PM- Pt advised of plan for treatment and pt agrees.  Labs (all labs ordered are listed, but only abnormal results are displayed) Labs Reviewed  SYNOVIAL CELL COUNT + DIFF, W/ CRYSTALS - Abnormal; Notable for the following:       Result Value   Color, Synovial YELLOW (*)    Appearance-Synovial CLOUDY (*)    WBC, Synovial 1,315 (*)    Neutrophil, Synovial 69 (*)    Monocyte-Macrophage-Synovial Fluid 27 (*)    All other components within normal limits  BODY FLUID CULTURE  GLUCOSE, SYNOVIAL FLUID  PROTEIN, SYNOVIAL FLUID  MISC [facility] TEST (SEND OUT)  MISC [facility] TEST (SEND OUT)    EKG  EKG Interpretation None       Radiology Dg Knee Complete 4 Views Left  Result Date: 09/20/2016 CLINICAL DATA:  Knee pain for 2 months EXAM: LEFT KNEE - COMPLETE 4+ VIEW COMPARISON:  None. FINDINGS: No evidence of fracture, dislocation, or joint effusion. No evidence of arthropathy or other focal bone abnormality. There is prepatellar soft tissue swelling. IMPRESSION: Prepatellar soft tissue swelling without acute osseous abnormality. Electronically Signed   By: Ulyses Jarred M.D.   On: 09/20/2016 17:52    Procedures Procedures (including critical care time)  Medications Ordered in ED Medications  lidocaine-EPINEPHrine (XYLOCAINE W/EPI) 2 %-1:200000 (PF) injection 10 mL (10 mLs Infiltration Given by Other 09/20/16 2201)     Initial Impression / Assessment and Plan / ED Course  I have reviewed the triage vital signs and the nursing notes.  Pertinent labs & imaging  results that were available during my care of the patient were reviewed by me and considered in my medical decision making (see chart for details).     Patient presents to ED for left knee pain after injury that occurred 1.5 months ago. He reports increase in pain and swelling over the past 2-3 days. He denies any additional injury since the initial "hitting my knee on a coffee table." He denies any history of gout or septic joint in the past. He was sent from urgent care for further evaluation and concern for septic joint. X-ray showed evidence of prepatellar soft tissue swelling without acute osseous abnormality. At this time he is afebrile with no history of fever. He is nontoxic appearing. On physical exam he has tenderness to palpation over the lateral side of the knee as well as just below the patella. He has pain  with weightbearing as well as flexion and extension of the knee. There is no temperature or color change noted. I have low suspicion for septic joint, based on these physical exam findings but some concern due to his pain with range of motion. Arthrocentesis was done and 10 mL of straw-colored fluid was pulled off of the joint. This was sent for analysis and return as having 1300 WBC count. I informed patient that the findings bring low suspicion for septic joint. We'll begin patient on doxycycline and advise him to follow up with orthopedist for further evaluation. Patient given knee immobilizer and crutches here in the ED. Patient appears stable for discharge at this time. Strict return precautions given.  Patient seen by and discussed with Dr. Tyrone Nine.  Final Clinical Impressions(s) / ED Diagnoses   Final diagnoses:  Acute pain of left knee    New Prescriptions Discharge Medication List as of 09/21/2016  1:05 AM    START taking these medications   Details  doxycycline (VIBRAMYCIN) 100 MG capsule Take 1 capsule (100 mg total) by mouth 2 (two) times daily., Starting Sat 09/21/2016,  Until Sat 10/05/2016, Print       I personally performed the services described in this documentation, which was scribed in my presence. The recorded information has been reviewed and is accurate.     Delia Heady, PA-C 09/21/16 [place], [place], DO 09/21/16 1610

## 2016-09-20 NOTE — ED Notes (Signed)
EDT Greg walking synovial fluid down to lab at this time.

## 2016-09-20 NOTE — ED Triage Notes (Signed)
Pt walked into a coffee table a few months ago and hurt his left knee. For the past week it has gotten worse and said it's swollen and painful. It's currently swollen right now. Took flexeril last night and hydrocodone and it did help. Michela Pitcher he took 2 before he came over and said he is on this medication for his back.

## 2016-09-20 NOTE — ED Notes (Signed)
Pt did not answer when called for room 

## 2016-09-20 NOTE — ED Triage Notes (Signed)
Reports hitting left knee on coffee table about a month and half ago.  Having continued pain since that's worse in the last 2 weeks with swelling.  Seen at [facility] [place] done there sent here for possible MRI.

## 2016-09-20 NOTE — Discharge Instructions (Signed)
[facility].

## 2016-09-20 NOTE — ED Provider Notes (Signed)
CSN: [id number]     Arrival date & time 09/20/16  1638 History   None    Chief Complaint  Patient presents with  . 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 Initially develops prepatellar discomfort and swelling but remained primarily minor with a tolerable discomfort. Over the past 3-5 days and especially the past couple of days he has noticed a significant change and that there is some swelling over the prepatellar bursa as well as increased swelling around the entire knee with development of erythema. Very painful to bear weight.      Past Medical History:  Diagnosis Date  . Arthritis    Past Surgical History:  Procedure Laterality Date  . BACK SURGERY     x2  . HERNIA REPAIR    . SPINE SURGERY     x2   No family history on file. Social History  Substance Use Topics  . Smoking status: Former Smoker    Quit date: 03/25/1988  . Smokeless tobacco: Never Used  . Alcohol use No    Review of Systems  Constitutional: Positive for activity change.  Respiratory: Negative.   Musculoskeletal:       As per history of present illness  Skin: Positive for color change.  Neurological: Negative.   All other systems reviewed and are negative.   Allergies  Patient has no known allergies.  Home Medications   Prior to Admission medications   Medication Sig Start Date End Date Taking? Authorizing Provider  cyclobenzaprine (FLEXERIL) 10 MG tablet Take 1 tablet (10 mg total) by mouth 3 (three) times daily as needed for muscle spasms. 06/12/16  Yes [name], PA-C  HYDROcodone-acetaminophen (NORCO) 10-325 MG tablet Take 1 tablet by mouth every 6 (six) hours as needed. 08/20/16  Yes [name], MD  diclofenac (VOLTAREN) 75 MG EC tablet TK 1 T PO BID 02/18/16   [provider]    gabapentin (NEURONTIN) 300 MG capsule TK 1 C PO TID 02/20/16   [provider]  ibuprofen (ADVIL,MOTRIN) 800 MG tablet Take 1 tablet (800 mg total) by mouth 3 (three) times daily. 06/12/16   [name], PA-C  sildenafil (VIAGRA) 100 MG tablet Take 1 tablet (100 mg total) by mouth daily as needed for erectile dysfunction. 08/21/12   [name], MD   Meds Ordered and Administered this Visit  Medications - No data to display  BP 134/80 (BP Location: Left Arm)   Pulse (!) 109 Comment: rn notified   Temp 97.8 F (36.6 C) (Oral)   Resp 16   SpO2 97%  No data found.   Physical Exam  Constitutional: He is oriented to person, place, and time. He appears well-developed and well-nourished.  Neck: Neck supple.  Pulmonary/Chest: Effort normal.  Musculoskeletal: He exhibits edema and tenderness.  Swelling, increased warmth and minor erythema over the left knee. Substantial tenderness to the medial aspect of the knee to include the tibial tuberosity and prepatellar bursa. The patient states that his pain is actually gotten worse since arrival and pain with weightbearing has also increased. Decreased range of motion, flexion only to 90 extension to approximately 45-50 limited due to swelling and pain.  Neurological: He is alert and oriented to person, place, and time.  Skin: Skin is warm and dry.  Psychiatric: He has a normal  mood and affect.  Nursing note and vitals reviewed.   Urgent Care Course     Procedures (including critical care time)  Labs Review Labs Reviewed - No data to display  Imaging Review Dg Knee Complete 4 Views Left  Result Date: 09/20/2016 CLINICAL DATA:  Knee pain for 2 months EXAM: LEFT KNEE - COMPLETE 4+ VIEW COMPARISON:  None. FINDINGS: No evidence of fracture, dislocation, or joint effusion. No evidence of arthropathy or other focal bone abnormality. There is prepatellar soft tissue swelling. IMPRESSION: Prepatellar soft tissue swelling without  acute osseous abnormality. Electronically Signed   By: Ulyses Jarred M.D.   On: 09/20/2016 17:52     Visual Acuity Review  Right Eye Distance:   Left Eye Distance:   Bilateral Distance:    Right Eye Near:   Left Eye Near:    Bilateral Near:         MDM   1. Acute pain of left knee    Although initially there was injury to what his bleed to be the prepatellar bursa with subsequent bursitis there have been significant changes in the past few days to include rapid progression of pain, swelling, erythema and increased warmth. Concern for possible septic joint versus inflammatory condition.    Janne Napoleon, NP 09/20/16 1906

## 2016-09-20 NOTE — ED Notes (Signed)
Pt moved to a room with a bed in it so he can lay down per PA.  Pt has hx of back surgeries.  Awaiting preliminary synovial fluid results.

## 2016-09-21 LAB — SYNOVIAL CELL COUNT + DIFF, W/ CRYSTALS
CRYSTALS FLUID: NONE SEEN
Eosinophils-Synovial: 0 % (ref 0–1)
Lymphocytes-Synovial Fld: 4 % (ref 0–20)
Monocyte-Macrophage-Synovial Fluid: 27 % — ABNORMAL LOW (ref 50–90)
NEUTROPHIL, SYNOVIAL: 69 % — AB (ref 0–25)
WBC, SYNOVIAL: 1315 /mm3 — AB (ref 0–200)

## 2016-09-21 MED ORDER — DOXYCYCLINE HYCLATE 100 MG PO CAPS: 100.0000 mg | ORAL_CAPSULE | Freq: Two times a day (BID) | ORAL | 0 refills | Status: AC

## 2016-09-21 NOTE — ED Notes (Signed)
Ortho tech notified of pt's need for knee immobilizer and crutches.

## 2016-09-21 NOTE — Discharge Instructions (Signed)
Take doxycycline 2 times daily for 2 weeks. Follow-up with orthopedist listed below for further evaluation. Using knee immobilizer and crutches as directed. Return to ED for worsening pain, numbness, weakness, trouble walking, additional injury, increased swelling, fevers.

## 2016-09-22 LAB — MISC LABCORP TEST (SEND OUT)
LABCORP TEST CODE: 19497
Labcorp test code: 19588

## 2016-09-24 LAB — BODY FLUID CULTURE: Culture: NO GROWTH

## 2016-10-26 DIAGNOSIS — M7042 Prepatellar bursitis, left knee: Secondary | ICD-10-CM

## 2016-10-27 ENCOUNTER — Emergency Department (HOSPITAL_COMMUNITY): Payer: No Typology Code available for payment source

## 2016-10-27 ENCOUNTER — Encounter (HOSPITAL_COMMUNITY)
Admission: EM | Disposition: A | Discharge status: Home / Self Care | Payer: Self-pay | Source: Home / Self Care | Attending: Emergency Medicine

## 2016-10-27 ENCOUNTER — Emergency Department (HOSPITAL_COMMUNITY): Payer: No Typology Code available for payment source | Admitting: Anesthesiology

## 2016-10-27 ENCOUNTER — Observation Stay (HOSPITAL_COMMUNITY)
Admission: EM | Admit: 2016-10-27 | Discharge: 2016-10-29 | Disposition: A | Discharge status: Home / Self Care | Payer: No Typology Code available for payment source | Attending: Orthopedic Surgery | Admitting: Orthopedic Surgery

## 2016-10-27 ENCOUNTER — Encounter (HOSPITAL_COMMUNITY): Payer: Self-pay | Admitting: *Deleted

## 2016-10-27 DIAGNOSIS — Z79899 Other long term (current) drug therapy: Secondary | ICD-10-CM | POA: Diagnosis not present

## 2016-10-27 DIAGNOSIS — Z87891 Personal history of nicotine dependence: Secondary | ICD-10-CM | POA: Insufficient documentation

## 2016-10-27 DIAGNOSIS — I10 Essential (primary) hypertension: Secondary | ICD-10-CM | POA: Diagnosis not present

## 2016-10-27 DIAGNOSIS — Z7984 Long term (current) use of oral hypoglycemic drugs: Secondary | ICD-10-CM | POA: Diagnosis not present

## 2016-10-27 DIAGNOSIS — M7042 Prepatellar bursitis, left knee: Principal | ICD-10-CM | POA: Insufficient documentation

## 2016-10-27 DIAGNOSIS — M1990 Unspecified osteoarthritis, unspecified site: Secondary | ICD-10-CM | POA: Insufficient documentation

## 2016-10-27 DIAGNOSIS — M25562 Pain in left knee: Secondary | ICD-10-CM | POA: Diagnosis present

## 2016-10-27 DIAGNOSIS — E118 Type 2 diabetes mellitus with unspecified complications: Secondary | ICD-10-CM

## 2016-10-27 DIAGNOSIS — M71169 Other infective bursitis, unspecified knee: Secondary | ICD-10-CM | POA: Diagnosis present

## 2016-10-27 DIAGNOSIS — E872 Acidosis, unspecified: Secondary | ICD-10-CM

## 2016-10-27 DIAGNOSIS — E1165 Type 2 diabetes mellitus with hyperglycemia: Secondary | ICD-10-CM | POA: Diagnosis not present

## 2016-10-27 HISTORY — PX: INCISION AND DRAINAGE OF WOUND: SHX1803

## 2016-10-27 LAB — URINALYSIS, ROUTINE W REFLEX MICROSCOPIC
Bilirubin Urine: NEGATIVE
GLUCOSE, UA: 500 mg/dL — AB
HGB URINE DIPSTICK: NEGATIVE
Ketones, ur: NEGATIVE mg/dL
LEUKOCYTES UA: NEGATIVE
Nitrite: NEGATIVE
PROTEIN: NEGATIVE mg/dL
SPECIFIC GRAVITY, URINE: 1.01 (ref 1.005–1.030)
pH: 5.5 (ref 5.0–8.0)

## 2016-10-27 LAB — CBC WITH DIFFERENTIAL/PLATELET
Basophils Absolute: 0 10*3/uL (ref 0.0–0.1)
Basophils Relative: 0 %
Eosinophils Absolute: 0.1 10*3/uL (ref 0.0–0.7)
Eosinophils Relative: 1 %
HEMATOCRIT: 41.9 % (ref 39.0–52.0)
HEMOGLOBIN: 14.9 g/dL (ref 13.0–17.0)
Lymphocytes Relative: 18 %
Lymphs Abs: 1.6 10*3/uL (ref 0.7–4.0)
MCH: 30.7 pg (ref 26.0–34.0)
MCHC: 35.6 g/dL (ref 30.0–36.0)
MCV: 86.2 fL (ref 78.0–100.0)
MONO ABS: 0.6 10*3/uL (ref 0.1–1.0)
Monocytes Relative: 7 %
NEUTROS PCT: 74 %
Neutro Abs: 6.7 10*3/uL (ref 1.7–7.7)
Platelets: 114 10*3/uL — ABNORMAL LOW (ref 150–400)
RBC: 4.86 MIL/uL (ref 4.22–5.81)
RDW: 12.7 % (ref 11.5–15.5)
WBC: 9 10*3/uL (ref 4.0–10.5)

## 2016-10-27 LAB — COMPREHENSIVE METABOLIC PANEL
ALT: 21 U/L (ref 17–63)
AST: 17 U/L (ref 15–41)
Albumin: 3.3 g/dL — ABNORMAL LOW (ref 3.5–5.0)
Alkaline Phosphatase: 94 U/L (ref 38–126)
Anion gap: 11 (ref 5–15)
BUN: 16 mg/dL (ref 6–20)
CHLORIDE: 94 mmol/L — AB (ref 101–111)
CO2: 22 mmol/L (ref 22–32)
CREATININE: 1.14 mg/dL (ref 0.61–1.24)
Calcium: 9.3 mg/dL (ref 8.9–10.3)
Glucose, Bld: 495 mg/dL — ABNORMAL HIGH (ref 65–99)
POTASSIUM: 3.8 mmol/L (ref 3.5–5.1)
SODIUM: 127 mmol/L — AB (ref 135–145)
Total Bilirubin: 1.2 mg/dL (ref 0.3–1.2)
Total Protein: 7.2 g/dL (ref 6.5–8.1)

## 2016-10-27 LAB — SYNOVIAL CELL COUNT + DIFF, W/ CRYSTALS
Crystals, Fluid: NONE SEEN
Lymphocytes-Synovial Fld: 3 % (ref 0–20)
MONOCYTE-MACROPHAGE-SYNOVIAL FLUID: 9 % — AB (ref 50–90)
NEUTROPHIL, SYNOVIAL: 88 % — AB (ref 0–25)
WBC, SYNOVIAL: UNDETERMINED /mm3 (ref 0–200)

## 2016-10-27 LAB — SEDIMENTATION RATE: Sed Rate: 66 mm/hr — ABNORMAL HIGH (ref 0–16)

## 2016-10-27 LAB — GLUCOSE, CAPILLARY
Glucose-Capillary: 295 mg/dL — ABNORMAL HIGH (ref 65–99)
Glucose-Capillary: 304 mg/dL — ABNORMAL HIGH (ref 65–99)

## 2016-10-27 LAB — I-STAT CG4 LACTIC ACID, ED: Lactic Acid, Venous: 2.49 mmol/L (ref 0.5–1.9)

## 2016-10-27 LAB — C-REACTIVE PROTEIN: CRP: 23.2 mg/dL — AB (ref <1.0)

## 2016-10-27 SURGERY — IRRIGATION AND DEBRIDEMENT WOUND
Anesthesia: General | Site: Knee | Laterality: Left

## 2016-10-27 MED ORDER — ONDANSETRON HCL 4 MG/2ML IJ SOLN
INTRAMUSCULAR | Status: AC
  Filled 2016-10-27: qty 2

## 2016-10-27 MED ORDER — GABAPENTIN 100 MG PO CAPS
100.0000 mg | ORAL_CAPSULE | Freq: Three times a day (TID) | ORAL | Status: DC
  Administered 2016-10-28 (×3): 100 mg via ORAL
  Filled 2016-10-27 (×5): qty 1

## 2016-10-27 MED ORDER — FENTANYL CITRATE (PF) 250 MCG/5ML IJ SOLN
INTRAMUSCULAR | Status: AC
  Filled 2016-10-27: qty 5

## 2016-10-27 MED ORDER — PROPOFOL 10 MG/ML IV BOLUS
INTRAVENOUS | Status: AC
  Filled 2016-10-27: qty 20

## 2016-10-27 MED ORDER — ONDANSETRON HCL 4 MG/2ML IJ SOLN: 4.0000 mg | Freq: Four times a day (QID) | INTRAMUSCULAR | Status: DC | PRN

## 2016-10-27 MED ORDER — INSULIN ASPART 100 UNIT/ML ~~LOC~~ SOLN
0.0000 [IU] | Freq: Every day | SUBCUTANEOUS | Status: DC
  Administered 2016-10-27: 4 [IU] via SUBCUTANEOUS
  Administered 2016-10-28: 3 [IU] via SUBCUTANEOUS

## 2016-10-27 MED ORDER — CEFAZOLIN SODIUM-DEXTROSE 2-3 GM-% IV SOLR
INTRAVENOUS | Status: DC | PRN
  Administered 2016-10-27: 2 g via INTRAVENOUS

## 2016-10-27 MED ORDER — MORPHINE SULFATE (PF) 4 MG/ML IV SOLN
4.0000 mg | INTRAVENOUS | Status: DC | PRN
  Administered 2016-10-28: 4 mg via INTRAVENOUS
  Filled 2016-10-27: qty 1

## 2016-10-27 MED ORDER — SUCCINYLCHOLINE CHLORIDE 200 MG/10ML IV SOSY
PREFILLED_SYRINGE | INTRAVENOUS | Status: AC
  Filled 2016-10-27: qty 10

## 2016-10-27 MED ORDER — PROPOFOL 10 MG/ML IV BOLUS
INTRAVENOUS | Status: DC | PRN
  Administered 2016-10-27: 30 mg via INTRAVENOUS
  Administered 2016-10-27: 200 mg via INTRAVENOUS

## 2016-10-27 MED ORDER — CHLORHEXIDINE GLUCONATE 4 % EX LIQD: 60.0000 mL | Freq: Once | CUTANEOUS | Status: DC

## 2016-10-27 MED ORDER — FENTANYL CITRATE (PF) 100 MCG/2ML IJ SOLN
INTRAMUSCULAR | Status: AC
  Administered 2016-10-27: 50 ug via INTRAVENOUS
  Filled 2016-10-27: qty 2

## 2016-10-27 MED ORDER — BUPIVACAINE HCL (PF) 0.5 % IJ SOLN
INTRAMUSCULAR | Status: AC
Start: 2016-10-27 — End: ?
  Filled 2016-10-27: qty 30

## 2016-10-27 MED ORDER — MIDAZOLAM HCL 5 MG/5ML IJ SOLN
INTRAMUSCULAR | Status: DC | PRN
  Administered 2016-10-27: 2 mg via INTRAVENOUS

## 2016-10-27 MED ORDER — SUCCINYLCHOLINE CHLORIDE 20 MG/ML IJ SOLN
INTRAMUSCULAR | Status: DC | PRN
  Administered 2016-10-27: 120 mg via INTRAVENOUS

## 2016-10-27 MED ORDER — SODIUM CHLORIDE 0.9 % IV SOLN
INTRAVENOUS | Status: DC | PRN
  Administered 2016-10-27: 21:00:00 via INTRAVENOUS

## 2016-10-27 MED ORDER — ACETAMINOPHEN 650 MG RE SUPP: 650.0000 mg | Freq: Four times a day (QID) | RECTAL | Status: DC | PRN

## 2016-10-27 MED ORDER — FENTANYL CITRATE (PF) 100 MCG/2ML IJ SOLN
25.0000 ug | INTRAMUSCULAR | Status: DC | PRN
  Administered 2016-10-27 (×2): 50 ug via INTRAVENOUS

## 2016-10-27 MED ORDER — LIDOCAINE HCL (CARDIAC) 20 MG/ML IV SOLN
INTRAVENOUS | Status: DC | PRN
  Administered 2016-10-27: 80 mg via INTRAVENOUS

## 2016-10-27 MED ORDER — CYCLOBENZAPRINE HCL 10 MG PO TABS
10.0000 mg | ORAL_TABLET | Freq: Three times a day (TID) | ORAL | Status: DC | PRN
  Administered 2016-10-28 (×3): 10 mg via ORAL
  Filled 2016-10-27 (×3): qty 1

## 2016-10-27 MED ORDER — OXYCODONE HCL 5 MG PO TABS
ORAL_TABLET | ORAL | Status: AC
  Filled 2016-10-27: qty 2

## 2016-10-27 MED ORDER — ONDANSETRON HCL 4 MG PO TABS: 4.0000 mg | ORAL_TABLET | Freq: Four times a day (QID) | ORAL | Status: DC | PRN

## 2016-10-27 MED ORDER — BUPIVACAINE HCL (PF) 0.5 % IJ SOLN
20.0000 mL | Freq: Once | INTRAMUSCULAR | Status: AC
Start: 2016-10-27 — End: 2016-10-27
  Administered 2016-10-27: 20 mL
  Filled 2016-10-27: qty 20

## 2016-10-27 MED ORDER — FENTANYL CITRATE (PF) 100 MCG/2ML IJ SOLN
INTRAMUSCULAR | Status: DC | PRN
  Administered 2016-10-27: 50 ug via INTRAVENOUS
  Administered 2016-10-27: 100 ug via INTRAVENOUS
  Administered 2016-10-27 (×2): 50 ug via INTRAVENOUS

## 2016-10-27 MED ORDER — LACTATED RINGERS IV SOLN
INTRAVENOUS | Status: DC | PRN
  Administered 2016-10-27: 22:00:00 via INTRAVENOUS

## 2016-10-27 MED ORDER — POTASSIUM CHLORIDE IN NACL 20-0.9 MEQ/L-% IV SOLN: INTRAVENOUS | Status: AC

## 2016-10-27 MED ORDER — INSULIN ASPART 100 UNIT/ML ~~LOC~~ SOLN
SUBCUTANEOUS | Status: AC
  Administered 2016-10-27: 4 [IU] via SUBCUTANEOUS
  Filled 2016-10-27: qty 1

## 2016-10-27 MED ORDER — LIDOCAINE 2% (20 MG/ML) 5 ML SYRINGE
INTRAMUSCULAR | Status: AC
  Filled 2016-10-27: qty 5

## 2016-10-27 MED ORDER — MORPHINE SULFATE (PF) 4 MG/ML IV SOLN
4.0000 mg | Freq: Once | INTRAVENOUS | Status: AC
  Administered 2016-10-27: 4 mg via INTRAVENOUS
  Filled 2016-10-27: qty 1

## 2016-10-27 MED ORDER — ONDANSETRON HCL 4 MG/2ML IJ SOLN
INTRAMUSCULAR | Status: DC | PRN
  Administered 2016-10-27: 4 mg via INTRAVENOUS

## 2016-10-27 MED ORDER — INSULIN ASPART 100 UNIT/ML ~~LOC~~ SOLN
0.0000 [IU] | Freq: Three times a day (TID) | SUBCUTANEOUS | Status: DC
Start: 2016-10-28 — End: 2016-10-29
  Administered 2016-10-28: 5 [IU] via SUBCUTANEOUS
  Administered 2016-10-28 – 2016-10-29 (×3): 8 [IU] via SUBCUTANEOUS
  Administered 2016-10-29: 5 [IU] via SUBCUTANEOUS

## 2016-10-27 MED ORDER — BUPIVACAINE HCL 0.5 % IJ SOLN
INTRAMUSCULAR | Status: DC | PRN
  Administered 2016-10-27: 30 mL

## 2016-10-27 MED ORDER — OXYCODONE HCL 5 MG PO TABS
5.0000 mg | ORAL_TABLET | ORAL | Status: DC | PRN
  Administered 2016-10-27 – 2016-10-29 (×8): 10 mg via ORAL
  Filled 2016-10-27 (×7): qty 2

## 2016-10-27 MED ORDER — DICLOFENAC SODIUM 75 MG PO TBEC
75.0000 mg | DELAYED_RELEASE_TABLET | Freq: Two times a day (BID) | ORAL | Status: DC
  Administered 2016-10-28 – 2016-10-29 (×3): 75 mg via ORAL
  Filled 2016-10-27 (×5): qty 1

## 2016-10-27 MED ORDER — METOCLOPRAMIDE HCL 5 MG/ML IJ SOLN: 5.0000 mg | Freq: Three times a day (TID) | INTRAMUSCULAR | Status: DC | PRN

## 2016-10-27 MED ORDER — METOCLOPRAMIDE HCL 5 MG PO TABS: 5.0000 mg | ORAL_TABLET | Freq: Three times a day (TID) | ORAL | Status: DC | PRN

## 2016-10-27 MED ORDER — CEFAZOLIN SODIUM-DEXTROSE 2-4 GM/100ML-% IV SOLN
2.0000 g | Freq: Four times a day (QID) | INTRAVENOUS | Status: AC
  Administered 2016-10-28 (×3): 2 g via INTRAVENOUS
  Filled 2016-10-27 (×3): qty 100

## 2016-10-27 MED ORDER — ACETAMINOPHEN 325 MG PO TABS
650.0000 mg | ORAL_TABLET | Freq: Four times a day (QID) | ORAL | Status: DC | PRN
  Administered 2016-10-28 (×2): 650 mg via ORAL
  Filled 2016-10-27 (×2): qty 2

## 2016-10-27 MED ORDER — MIDAZOLAM HCL 2 MG/2ML IJ SOLN
INTRAMUSCULAR | Status: AC
  Filled 2016-10-27: qty 2

## 2016-10-27 MED ORDER — SODIUM CHLORIDE 0.9 % IV BOLUS (SEPSIS)
1000.0000 mL | Freq: Once | INTRAVENOUS | Status: AC
  Administered 2016-10-27: 1000 mL via INTRAVENOUS

## 2016-10-27 MED ORDER — VANCOMYCIN HCL 1000 MG IV SOLR
INTRAVENOUS | Status: AC
  Filled 2016-10-27: qty 1000

## 2016-10-27 MED ORDER — VANCOMYCIN HCL 1000 MG IV SOLR
INTRAVENOUS | Status: DC | PRN
  Administered 2016-10-27: 1000 mg via TOPICAL

## 2016-10-27 MED ORDER — POVIDONE-IODINE 10 % EX SWAB: 2.0000 "application " | Freq: Once | CUTANEOUS | Status: DC

## 2016-10-27 SURGICAL SUPPLY — 46 items
BANDAGE ACE 4X5 VEL STRL LF (GAUZE/BANDAGES/DRESSINGS) IMPLANT
BANDAGE ELASTIC 4 VELCRO ST LF (GAUZE/BANDAGES/DRESSINGS) ×3 IMPLANT
BANDAGE ELASTIC 6 VELCRO ST LF (GAUZE/BANDAGES/DRESSINGS) ×3 IMPLANT
BNDG COHESIVE 4X5 TAN STRL (GAUZE/BANDAGES/DRESSINGS) IMPLANT
BNDG COHESIVE 6X5 TAN STRL LF (GAUZE/BANDAGES/DRESSINGS) ×3 IMPLANT
BNDG GAUZE ELAST 4 BULKY (GAUZE/BANDAGES/DRESSINGS) IMPLANT
CONT SPEC 4OZ CLIKSEAL STRL BL (MISCELLANEOUS) ×3 IMPLANT
COVER SURGICAL LIGHT HANDLE (MISCELLANEOUS) ×3 IMPLANT
DRAIN PENROSE 1/2X12 LTX STRL (WOUND CARE) ×3 IMPLANT
DRAPE U-SHAPE 47X51 STRL (DRAPES) ×3 IMPLANT
DRSG PAD ABDOMINAL 8X10 ST (GAUZE/BANDAGES/DRESSINGS) ×6 IMPLANT
ELECT REM PT RETURN 9FT ADLT (ELECTROSURGICAL) ×3
ELECTRODE REM PT RTRN 9FT ADLT (ELECTROSURGICAL) ×1 IMPLANT
GAUZE SPONGE 4X4 12PLY STRL (GAUZE/BANDAGES/DRESSINGS) IMPLANT
GAUZE SPONGE 4X4 12PLY STRL LF (GAUZE/BANDAGES/DRESSINGS) ×3 IMPLANT
GAUZE XEROFORM 5X9 LF (GAUZE/BANDAGES/DRESSINGS) ×3 IMPLANT
GLOVE BIOGEL PI IND STRL 7.5 (GLOVE) ×1 IMPLANT
GLOVE BIOGEL PI IND STRL 8 (GLOVE) ×2 IMPLANT
GLOVE BIOGEL PI INDICATOR 7.5 (GLOVE) ×2
GLOVE BIOGEL PI INDICATOR 8 (GLOVE) ×4
GLOVE SURG ORTHO 8.0 STRL STRW (GLOVE) ×6 IMPLANT
GOWN STRL REUS W/ TWL LRG LVL3 (GOWN DISPOSABLE) ×2 IMPLANT
GOWN STRL REUS W/ TWL XL LVL3 (GOWN DISPOSABLE) ×1 IMPLANT
GOWN STRL REUS W/TWL LRG LVL3 (GOWN DISPOSABLE) ×4
GOWN STRL REUS W/TWL XL LVL3 (GOWN DISPOSABLE) ×2
KIT BASIN OR (CUSTOM PROCEDURE TRAY) ×3 IMPLANT
KIT ROOM TURNOVER OR (KITS) ×3 IMPLANT
MANIFOLD NEPTUNE II (INSTRUMENTS) ×3 IMPLANT
NS IRRIG 1000ML POUR BTL (IV SOLUTION) ×3 IMPLANT
PACK ORTHO EXTREMITY (CUSTOM PROCEDURE TRAY) ×3 IMPLANT
PAD ARMBOARD 7.5X6 YLW CONV (MISCELLANEOUS) ×6 IMPLANT
PAD CAST 4YDX4 CTTN HI CHSV (CAST SUPPLIES) ×1 IMPLANT
PADDING CAST COTTON 4X4 STRL (CAST SUPPLIES) ×2
STOCKINETTE IMPERVIOUS 9X36 MD (GAUZE/BANDAGES/DRESSINGS) ×3 IMPLANT
SUT ETHILON 3 0 PS 1 (SUTURE) ×6 IMPLANT
SUT VIC AB 3-0 SH 27 (SUTURE) ×2
SUT VIC AB 3-0 SH 27X BRD (SUTURE) ×1 IMPLANT
SWAB CULTURE ESWAB REG 1ML (MISCELLANEOUS) ×3 IMPLANT
SWAB CULTURE LIQ STUART DBL (MISCELLANEOUS) ×3 IMPLANT
SYR CONTROL 10ML LL (SYRINGE) ×3 IMPLANT
TOWEL OR 17X26 10 PK STRL BLUE (TOWEL DISPOSABLE) ×3 IMPLANT
TUBE CONNECTING 12'X1/4 (SUCTIONS) ×1
TUBE CONNECTING 12X1/4 (SUCTIONS) ×2 IMPLANT
UNDERPAD 30X30 (UNDERPADS AND DIAPERS) ×3 IMPLANT
WATER STERILE IRR 1000ML POUR (IV SOLUTION) ×3 IMPLANT
YANKAUER SUCT BULB TIP NO VENT (SUCTIONS) ×3 IMPLANT

## 2016-10-27 NOTE — ED Notes (Signed)
Pt returned from radiology, Dr. Marlou Sa at bedside. Pt signed consent for knee tap as well as possible surgery.

## 2016-10-27 NOTE — Brief Op Note (Addendum)
10/27/2016  10:29 PM  PATIENT:  [name]  61 y.o. male  PRE-OPERATIVE DIAGNOSIS:  Pre-Patella Bursa infection  POST-OPERATIVE DIAGNOSIS:  same  PROCEDURE:  Procedure(s): IRRIGATION AND DEBRIDEMENT WOUND prepatellar bursa  SURGEON:  Surgeon(s): Meredith Pel, MD  ASSISTANT: none  ANESTHESIA:   general  EBL: 15 ml    Total I/O In: 1000 [I.V.:1000] Out: -   BLOOD ADMINISTERED: none  DRAINS: Penrose drain in the bursa   LOCAL MEDICATIONS USED:  30 cc plain marcaine  SPECIMEN: cultures x 2 COUNTS:  YES  TOURNIQUET:  * No tourniquets in log *  DICTATION: .Other Dictation: Dictation Number [id number]  PLAN OF CARE: Admit for overnight observation  PATIENT DISPOSITION:  PACU - hemodynamically stable

## 2016-10-27 NOTE — Anesthesia Postprocedure Evaluation (Signed)
Anesthesia Post Note  Patient: Vincent [name]  Procedure(s) Performed: Procedure(s) (LRB): IRRIGATION AND DEBRIDEMENT LEFT PATELLA (Left)     Patient location during evaluation: PACU Anesthesia Type: General Level of consciousness: awake Pain management: pain level controlled Vital Signs Assessment: post-procedure vital signs reviewed and stable Respiratory status: spontaneous breathing Cardiovascular status: stable Anesthetic complications: no    Last Vitals:  Vitals:   10/27/16 2045 10/27/16 2247  BP: 129/80 (!) 155/93  Pulse: (!) 122 (!) 122  Resp:  12  Temp:  36.6 C    Last Pain:  Vitals:   10/27/16 2056  TempSrc:   PainSc: 5                  Ahmere Hemenway

## 2016-10-27 NOTE — Transfer of Care (Signed)
Immediate Anesthesia Transfer of Care Note  Patient: Vincent [name]  Procedure(s) Performed: Procedure(s): IRRIGATION AND DEBRIDEMENT LEFT PATELLA (Left)  Patient Location: PACU  Anesthesia Type:General  Level of Consciousness: awake, alert , oriented and patient cooperative  Airway & Oxygen Therapy: Patient Spontanous Breathing and Patient connected to nasal cannula oxygen  Post-op Assessment: Report given to RN and Post -op Vital signs reviewed and stable  Post vital signs: Reviewed and stable  Last Vitals:  Vitals:   10/27/16 2045 10/27/16 2247  BP: 129/80 (!) (P) 155/93  Pulse: (!) 122 (!) (P) 122  Resp:  (P) 12  Temp:  (P) 36.6 C    Last Pain:  Vitals:   10/27/16 2056  TempSrc:   PainSc: 5          Complications: No apparent anesthesia complications.  Respirations unlabored 02 sats 100%

## 2016-10-27 NOTE — Progress Notes (Signed)
DR. Marlou Sa called and orders received for elevated CBG 304

## 2016-10-27 NOTE — ED Notes (Signed)
Call to bring pt to short stay [facility] by Evette Doffing, pt transported by EMT.\ Pt emptied bladder, all belonging taken home by wife.

## 2016-10-27 NOTE — ED Notes (Signed)
Synovial fluid sample walked to lab by NS, successful aspiration by Dr. Marlou Sa, pt tolerated well

## 2016-10-27 NOTE — Anesthesia Preprocedure Evaluation (Addendum)
Anesthesia Evaluation  Patient identified by MRN, date of birth, ID band Patient awake    Reviewed: Allergy & Precautions, NPO status , Patient's Chart, lab work & pertinent test results  Airway Mallampati: III  TM Distance: >3 FB Neck ROM: Full    Dental  (+) Teeth Intact, Dental Advisory Given   Pulmonary former smoker,    breath sounds clear to auscultation       Cardiovascular negative cardio ROS   Rhythm:Regular Rate:Normal     Neuro/Psych    GI/Hepatic   Endo/Other  Blood sugar 495 today.  Patient says he has "been this high before".  Denies history of diabetes but says he is "borderline"  Renal/GU      Musculoskeletal  (+) Arthritis ,   Abdominal   Peds  Hematology   Anesthesia Other Findings Chronic narcotic use  Reproductive/Obstetrics                           Anesthesia Physical Anesthesia Plan  ASA: III  Anesthesia Plan: General   Post-op Pain Management:    Induction: Intravenous and Rapid sequence  PONV Risk Score and Plan: 3 and Ondansetron, Dexamethasone, Midazolam and Propofol infusion  Airway Management Planned: Oral ETT  Additional Equipment:   Intra-op Plan:   Post-operative Plan: Extubation in OR  Informed Consent: I have reviewed the patients History and Physical, chart, labs and discussed the procedure including the risks, benefits and alternatives for the proposed anesthesia with the patient or authorized representative who has indicated his/her understanding and acceptance.   Dental advisory given  Plan Discussed with: CRNA and Anesthesiologist  Anesthesia Plan Comments:         Anesthesia Quick Evaluation

## 2016-10-27 NOTE — Anesthesia Procedure Notes (Signed)
Procedure Name: Intubation Date/Time: 10/27/2016 9:52 PM Performed by: Suzy Bouchard Pre-anesthesia Checklist: Patient identified, Timeout performed, Patient being monitored, Suction available and Emergency Drugs available Patient Re-evaluated:Patient Re-evaluated prior to induction Oxygen Delivery Method: Circle system utilized Preoxygenation: Pre-oxygenation with 100% oxygen Induction Type: IV induction, Cricoid Pressure applied and Rapid sequence Laryngoscope Size: Miller and 2 Grade View: Grade I Tube type: Oral Tube size: 7.5 mm Number of attempts: 1 Airway Equipment and Method: Stylet Placement Confirmation: ETT inserted through vocal cords under direct vision,  positive ETCO2 and breath sounds checked- equal and bilateral Secured at: 22 cm Tube secured with: Tape Dental Injury: Teeth and Oropharynx as per pre-operative assessment

## 2016-10-27 NOTE — Consult Note (Signed)
Reason for Consult:left knee pain Referring Physician: Dr Karn Pickler is an 61 y.o. male.  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  He was in the emergency room but a month ago where he had the knee aspirated and it was negative for infection.  He does work as a Development worker, community.  He describes some drainage from the peripatellar region last night.  He reports increasing pain and inability to move the knee without pain.  He does take Vicodin on a fairly regular basis for shoulder arthritis.  He is also had 2 back surgeries done.  He doesn't really describe any definitive fevers and chills although he did feel poorly last night.  Past Medical History:  Diagnosis Date  . Arthritis     Past Surgical History:  Procedure Laterality Date  . BACK SURGERY     x2  . HERNIA REPAIR    . SPINE SURGERY     x2    No family history on file.  Social History:  reports that he quit smoking about 28 years ago. He has never used smokeless tobacco. He reports that he does not drink alcohol or use drugs.  Allergies: No Known Allergies  Medications: I have reviewed the patient's current medications.  Results for orders placed or performed during the hospital encounter of 10/27/16 (from the past 48 hour(s))  CBC with Differential     Status: None (Preliminary result)   Collection Time: 10/27/16  6:34 PM  Result Value Ref Range   WBC 9.0 4.0 - 10.5 K/uL   RBC 4.86 4.22 - 5.81 MIL/uL   Hemoglobin 14.9 13.0 - 17.0 g/dL   HCT 41.9 39.0 - 52.0 %   MCV 86.2 78.0 - 100.0 fL   MCH 30.7 26.0 - 34.0 pg   MCHC 35.6 30.0 - 36.0 g/dL   RDW 12.7 11.5 - 15.5 %   Platelets PENDING 150 - 400 K/uL   Neutrophils Relative % 74 %   Neutro Abs 6.7 1.7 - 7.7 K/uL   Lymphocytes Relative 18 %   Lymphs Abs 1.6 0.7 - 4.0 K/uL   Monocytes Relative 7 %   Monocytes Absolute 0.6 0.1 - 1.0 K/uL   Eosinophils Relative 1 %   Eosinophils Absolute 0.1 0.0 - 0.7 K/uL   Basophils Relative  0 %   Basophils Absolute 0.0 0.0 - 0.1 K/uL  I-Stat CG4 Lactic Acid, ED     Status: Abnormal   Collection Time: 10/27/16  6:49 PM  Result Value Ref Range   Lactic Acid, Venous 2.49 (HH) 0.5 - 1.9 mmol/L   Comment NOTIFIED PHYSICIAN     No results found.  Review of Systems  Musculoskeletal: Positive for joint pain.  All other systems reviewed and are negative.  Blood pressure (!) 160/95, pulse (!) 117, temperature 98.3 F (36.8 C), temperature source Oral, resp. rate 16, SpO2 98 %. Physical Exam  Constitutional: He appears well-developed.  HENT:  Head: Normocephalic.  Eyes: Pupils are equal, round, and reactive to light.  Cardiovascular: Normal rate.   Respiratory: Effort normal.  Neurological: He is alert.  Skin: Skin is warm.  Psychiatric: He has a normal mood and affect.   examination the left knee demonstrates mild effusion.  There is some cellulitis centered around the patellar tendon extending proximally and laterally.  Mild fluctuance is present in this area and it is tender.  Some erythema extends distally and proximally but not on the medial  aspect of the medial aspect of the proximal patella.  No proximal lymphadenopathy is present.  Pedal pulses intact.  Assessment/Plan: Impression is infected prepatellar bursitis with knee effusion.  Plan is radiographs which are pending along with knee aspiration.  Patient will likely need to go to surgery tonight for I&D of that prepatellar bursa.  We will see what the knee shows in terms of aspiration.  The knee may also need to have a washout performed.  His work as a Development worker, community does predispose him to having this problem.  Vincent [name] 10/27/2016, 7:32 PM

## 2016-10-27 NOTE — Progress Notes (Signed)
Patient's left knee aspirated.  Clear yellow fluid obtained.  It does not appear to be infected.  It is not cloudy.  Gram stain pending  Plan at this time is operative I&D of infected prepatellar bursa .  We will hold off on preoperative IV antibiotics until cultures obtained.  Also plan to keep him overnight for a few IV antibiotics postop doses.

## 2016-10-27 NOTE — ED Notes (Signed)
Report given to anesthesia, OR has not called fore patient. Pt undressing at this time Last food 1530 today, sips of spite since being in the ED

## 2016-10-27 NOTE — ED Triage Notes (Signed)
To ED for eval of left knee pain, swelling, and drainage. Pt states he was here a few wks ago and had knee drained and 'cut open'. Pt states last night the pain was so bad that he took 3 nerve pills and another pill and was able to sleep a few hours. When he woke up this around 1pm his bed was 'soaked with blood from my knee'. Pt has knee bandaged. Unable to bear weight due to pain.
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
## 2016-10-28 ENCOUNTER — Encounter (HOSPITAL_COMMUNITY): Payer: Self-pay | Admitting: Orthopedic Surgery

## 2016-10-28 DIAGNOSIS — E118 Type 2 diabetes mellitus with unspecified complications: Secondary | ICD-10-CM

## 2016-10-28 DIAGNOSIS — M71162 Other infective bursitis, left knee: Secondary | ICD-10-CM

## 2016-10-28 DIAGNOSIS — E872 Acidosis, unspecified: Secondary | ICD-10-CM

## 2016-10-28 DIAGNOSIS — I10 Essential (primary) hypertension: Secondary | ICD-10-CM

## 2016-10-28 LAB — GLUCOSE, CAPILLARY
GLUCOSE-CAPILLARY: 269 mg/dL — AB (ref 65–99)
Glucose-Capillary: 304 mg/dL — ABNORMAL HIGH (ref 65–99)
Glucose-Capillary: 264 mg/dL — ABNORMAL HIGH (ref 65–99)
Glucose-Capillary: 240 mg/dL — ABNORMAL HIGH (ref 65–99)
Glucose-Capillary: 287 mg/dL — ABNORMAL HIGH (ref 65–99)

## 2016-10-28 LAB — LACTIC ACID, PLASMA: Lactic Acid, Venous: 1.4 mmol/L (ref 0.5–1.9)

## 2016-10-28 MED ORDER — CEFAZOLIN SODIUM-DEXTROSE 2-4 GM/100ML-% IV SOLN
2.0000 g | Freq: Four times a day (QID) | INTRAVENOUS | Status: DC
  Administered 2016-10-28 – 2016-10-29 (×2): 2 g via INTRAVENOUS
  Filled 2016-10-28 (×3): qty 100

## 2016-10-28 MED ORDER — CYCLOBENZAPRINE HCL 10 MG PO TABS: 10.0000 mg | ORAL_TABLET | Freq: Three times a day (TID) | ORAL | 0 refills | Status: DC | PRN

## 2016-10-28 MED ORDER — METFORMIN HCL 500 MG PO TABS: 500.0000 mg | ORAL_TABLET | Freq: Two times a day (BID) | ORAL | 0 refills | Status: DC

## 2016-10-28 MED ORDER — INSULIN ASPART 100 UNIT/ML ~~LOC~~ SOLN
8.0000 [IU] | Freq: Once | SUBCUTANEOUS | Status: AC
  Administered 2016-10-28: 8 [IU] via SUBCUTANEOUS

## 2016-10-28 MED ORDER — OXYCODONE HCL 5 MG PO TABS: 5.0000 mg | ORAL_TABLET | ORAL | 0 refills | Status: DC | PRN

## 2016-10-28 NOTE — Op Note (Signed)
NAME:  Vincent [name], Vincent [name]                    ACCOUNT NO.:  MEDICAL RECORD NO.:  [id number]  LOCATION:                                 FACILITY:  PHYSICIAN:  Anderson Malta, M.D.    DATE OF BIRTH:  [birth year]  DATE OF PROCEDURE: DATE OF DISCHARGE:                              OPERATIVE REPORT   PREOPERATIVE DIAGNOSIS:  Left knee infected prepatellar bursitis.  POSTOPERATIVE DIAGNOSIS:  Left knee infected prepatellar bursitis.  PROCEDURE:  Left knee infected prepatellar bursitis I and D, excisional debridement with placement of vancomycin powder and Penrose drain.  SURGEON:  Anderson Malta, M.D.  ASSISTANT:  None.  ANESTHESIA:  General.  INDICATIONS:  [name] is a 61 year old patient with closed space infection of his left knee, prepatellar bursa.  Knee aspiration was negative for infection in terms of Gram stain being negative.  Presents now for operative management after explanation of risks and benefits.  PROCEDURE IN DETAIL:  The patient was brought to the operating room where general anesthetic was induced.  Perioperative IV antibiotics were not administered until cultures were obtained.  The patient's left leg was prepped with Hibiclens and saline, draped in a sterile manner.  Time- out was called.  Left knee prepatellar bursa was incised with an incision linearly over the prepatellar bursa.  Skin and subcutaneous tissue were sharply divided.  Purulent material was encountered.  This was sent for culture and Gram stain.  Excisional debridement was then performed with a curette.  All surfaces were irrigated.  The bursal infection did extend proximally posteriorly and laterally.  This cavity was thoroughly debrided.  Thorough irrigation was then performed with 3 liters of irrigating solution.  Vancomycin powder was then placed into the incision, which was then loosely reapproximated using 3-0 nylon sutures.  A Penrose drain was placed as well in the deep tendon portion of the  proximal posterolateral aspect of this cavity.  Bulky dressing was then placed.  It should be noted that the skin edges were anesthetized with 30 mL of plain Marcaine to prevent significant postoperative pain.     Anderson Malta, M.D.    GSD/MEDQ  D:  10/27/2016  T:  10/28/2016  Job:  [id number]

## 2016-10-28 NOTE — Progress Notes (Signed)
Inpatient Diabetes Program Recommendations  AACE/ADA: New Consensus Statement on Inpatient Glycemic Control (2015)  Target Ranges:  Prepandial:   less than 140 mg/dL      Peak postprandial:   less than 180 mg/dL (1-2 hours)      Critically ill patients:  140 - 180 mg/dL   Lab Results  Component Value Date   GLUCAP 264 (H) 10/28/2016    Review of Glycemic Control Results for Vincent [name], Vincent [name] (MRN [id number]) as of 10/28/2016 13:42  Ref. Range 10/27/2016 21:22 10/27/2016 22:54 10/27/2016 23:38 10/28/2016 06:45 10/28/2016 11:43  Glucose-Capillary Latest Ref Range: 65 - 99 mg/dL 304 (H) 304 (H) 295 (H) 269 (H) 264 (H)   Diabetes history: No hx DM noted Current orders for Inpatient glycemic control: Novolog correction 0-15 units tid + 0-5 units hs  Inpatient Diabetes Program Recommendations:  Noted A1c and C-peptide ordered. Please consider while in the hospital and waiting lab results: -Lantus 18 units daily Will follow.  Thank you, Vincent [name]. Vincent Nicolls, RN, MSN, CDE  Diabetes Coordinator Inpatient Glycemic Control Team Team Pager [phone] (8am-5pm) 10/28/2016 1:56 PM

## 2016-10-28 NOTE — Progress Notes (Signed)
Pt stable Drain removed Appreciate med mngmt Plan dc am
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
## 2016-10-29 DIAGNOSIS — E118 Type 2 diabetes mellitus with unspecified complications: Secondary | ICD-10-CM

## 2016-10-29 DIAGNOSIS — E872 Acidosis: Secondary | ICD-10-CM

## 2016-10-29 DIAGNOSIS — I10 Essential (primary) hypertension: Secondary | ICD-10-CM

## 2016-10-29 LAB — BASIC METABOLIC PANEL
Anion gap: 7 (ref 5–15)
BUN: 17 mg/dL (ref 6–20)
CALCIUM: 8.5 mg/dL — AB (ref 8.9–10.3)
CO2: 28 mmol/L (ref 22–32)
CREATININE: 0.98 mg/dL (ref 0.61–1.24)
Chloride: 98 mmol/L — ABNORMAL LOW (ref 101–111)
Glucose, Bld: 314 mg/dL — ABNORMAL HIGH (ref 65–99)
Potassium: 3.6 mmol/L (ref 3.5–5.1)
SODIUM: 133 mmol/L — AB (ref 135–145)

## 2016-10-29 LAB — GLUCOSE, CAPILLARY
GLUCOSE-CAPILLARY: 212 mg/dL — AB (ref 65–99)
Glucose-Capillary: 277 mg/dL — ABNORMAL HIGH (ref 65–99)

## 2016-10-29 LAB — HEMOGLOBIN A1C
Hgb A1c MFr Bld: 8.9 % — ABNORMAL HIGH (ref 4.8–5.6)
MEAN PLASMA GLUCOSE: 209 mg/dL

## 2016-10-29 LAB — C-PEPTIDE: C PEPTIDE: 7 ng/mL — AB (ref 1.1–4.4)

## 2016-10-29 MED ORDER — BLOOD GLUCOSE METER KIT: PACK | 0 refills | Status: AC

## 2016-10-29 MED ORDER — METFORMIN HCL ER 500 MG PO TB24: ORAL_TABLET | ORAL | 0 refills | Status: DC

## 2016-10-29 MED ORDER — LIVING WELL WITH DIABETES BOOK
Freq: Once | Status: AC
  Administered 2016-10-29: 11:00:00
  Filled 2016-10-29 (×2): qty 1

## 2016-10-29 NOTE — Progress Notes (Signed)
Reviewed discharge instructions/medications with patient.  Reviewed diabetic information with him again/videos/booklets. Reviewed how to use glucometer.  Diabetic coordinator came up and educated patient as well.  Pt is stable and ready for discharge. Waiting on wife to pick him up.

## 2016-10-29 NOTE — Progress Notes (Signed)
Inpatient Diabetes Program Recommendations  AACE/ADA: New Consensus Statement on Inpatient Glycemic Control (2015)  Target Ranges:  Prepandial:   less than 140 mg/dL      Peak postprandial:   less than 180 mg/dL (1-2 hours)      Critically ill patients:  140 - 180 mg/dL   Lab Results  Component Value Date   GLUCAP 277 (H) 10/29/2016   HGBA1C 8.9 (H) 10/28/2016    Review of Glycemic Control  Inpatient Diabetes Program Recommendations:   Spoke with pt @ bedside about new diagnosis. Discussed A1C 8.9 (average CBG 209 over the past 2-3 months) results with them and explained what an A1C is, basic pathophysiology of DM Type 2, basic home care, basic diabetes diet nutrition principles, importance of checking CBGs and maintaining good CBG control to prevent long-term and short-term complications. Reviewed signs and symptoms of hyperglycemia and hypoglycemia and how to treat hypoglycemia at home. Also reviewed blood sugar goals at home.  RNs to provide ongoing basic DM education at bedside with this patient. Have ordered educational booklet and DM videos. Have also placed RD consult for DM diet education for this patient.  Patient states he has had an elevated blood glucose before but decreased to normal with eliminating foods and drinks with sugar and exercise. Patient states willingness to decrease amount of sugar and carbohydrates. States wife eats healthy and has been trying to get him to also.  Thank you, Nani Gasser. Meleny Tregoning, RN, MSN, CDE  Diabetes Coordinator Inpatient Glycemic Control Team Team Pager [phone] (8am-5pm) 10/29/2016 1:56 PM

## 2016-10-29 NOTE — Progress Notes (Signed)
10/29/2016 8:29 AM   Progress Note   I met with patient and discussed his diabetes management for home and my instructions for home metformin titration.  He is declining insulin at this time.  I explained to him that he may need to be on insulin in future.  He verbalized understanding.  He is going to follow up with his PCP.  I am prescribing Glucophage XR 500 mg - take 1 po with breakfast x 5 days, then 1 po BIDAC x 5 days, then 2 po BIDAC after that.  He was also given a glucometer prescription with testing strips and lancets.    He will need to test 2 times per day.  Further treatment with his primary care providers  Vitals:   10/28/16 2116 10/29/16 0439  BP: (!) 141/68 125/77  Pulse: 100 97  Resp: 18 18  Temp: 98.4 F (36.9 C) 98.6 F (37 C)   Review of systems:  Positive for pain in rib cage, dizziness, otherwise all systems reviewed and reported negative  Exam  General : awake, alert, NAD, cooperative, pleasant.  HEENT: dry MM Neck supple, thyroid soft, no nodules or masses palpated CV: normal s1, s2 sounds Lungs: BBS clear to auscultation ABD: soft, nondistended, nontender, no masses, normal BS Ext: wounds c/d/i Neuro: nonfocal  A/P  Type 2 diabetes mellitus, with hyperglycemia - uncontrolled as evidenced by a1c 8.9%, I do recommend that he go home on insulin but patient is not willing to do that at this time.  Will discharge on Glucophage XR (Metformin ER) 500 mg tabs with titration scheduled noted above.  Glucose meter and testing supplies prescribed as well,  Refer for outpatient diabetes education.  Follow up closely with PCP for further management.  Pt was counseled at the bedside and verbalized understanding.  Appreciate assistance from diabetes coordinator team.   CBG (last 3)   Recent Labs  10/28/16 1643 10/28/16 2231 10/29/16 [place], MD [facility]

## 2016-10-29 NOTE — Plan of Care (Cosign Needed)
Problem: Food- and Nutrition-Related Knowledge Deficit (NB-1.1) Goal: Nutrition education Formal process to instruct or train a patient/client in a skill or to impart knowledge to help patients/clients voluntarily manage or modify food choices and eating behavior to maintain or improve health. Outcome: Completed/Met Date Met: 10/29/16  RD consulted for nutrition education regarding Vincent [name].   Lab Results  Component Value Date   HGBA1C 8.9 (H) 10/28/2016    RD provided pt's wife "Vincent Vincent [name]" and "Vincent Vincent [name]" handouts from the [employer]. Discussed different food groups and their effects on blood sugar, emphasizing Vincent-containing foods. Provided list of carbohydrates and recommended serving sizes of common foods. Discussed importance of controlled and consistent Vincent intake throughout the day.   Pt unavailable at time of visit. Both cardiac rehab and Vincent [name] coordinator spoke with pt today regarding new diet. Pt's wife asked questions about daily intake since she will be providing his meals.  Expect fair compliance.  Body mass index is 28.55 kg/m. Pt meets criteria for overweight based on current BMI.  Current diet order is Vincent modified, patient is consuming approximately 100% of meals at this time. Labs and medications reviewed. No further nutrition interventions warranted at this time. RD contact information provided. If additional nutrition issues arise, please re-consult RD.  Parks Ranger, RDN 10/29/2016 1:50 PM

## 2016-10-30 LAB — MISC LABCORP TEST (SEND OUT)
LABCORP TEST CODE: 19588
Labcorp test code: 19497

## 2016-10-30 LAB — PROTEIN, BODY FLUID (OTHER): Total Protein, Body Fluid Other: 3.1 g/dL

## 2016-10-30 LAB — GLUCOSE, BODY FLUID OTHER: GLUCOSE, BODY FLUID OTHER: 388 mg/dL

## 2016-10-31 LAB — BODY FLUID CULTURE: Culture: NO GROWTH

## 2016-11-01 ENCOUNTER — Telehealth (INDEPENDENT_AMBULATORY_CARE_PROVIDER_SITE_OTHER): Payer: Self-pay | Admitting: *Deleted

## 2016-11-01 LAB — CULTURE, BLOOD (ROUTINE X 2)
Culture: NO GROWTH
Culture: NO GROWTH
SPECIAL REQUESTS: ADEQUATE
Special Requests: ADEQUATE

## 2016-11-01 LAB — AEROBIC/ANAEROBIC CULTURE (SURGICAL/DEEP WOUND)

## 2016-11-01 LAB — AEROBIC/ANAEROBIC CULTURE W GRAM STAIN (SURGICAL/DEEP WOUND)

## 2016-11-01 NOTE — Telephone Encounter (Signed)
Pt had surgery on 8/5. He does not have a follow up scheduled since Marlou Sa is out of the office. Pt stated that it is infected and doesn't know how soon he needs to be seen.

## 2016-11-01 NOTE — Telephone Encounter (Signed)
Patient scheduled with Dr Lorin Mercy Monday

## 2016-11-04 ENCOUNTER — Ambulatory Visit (INDEPENDENT_AMBULATORY_CARE_PROVIDER_SITE_OTHER): Payer: Self-pay | Admitting: Orthopaedic Surgery

## 2016-11-04 ENCOUNTER — Encounter (INDEPENDENT_AMBULATORY_CARE_PROVIDER_SITE_OTHER): Payer: Self-pay | Admitting: Orthopaedic Surgery

## 2016-11-04 VITALS — BP 139/81 | HR 118 | Temp 98.2°F | Ht 70.0 in | Wt 199.0 lb

## 2016-11-04 DIAGNOSIS — M71162 Other infective bursitis, left knee: Secondary | ICD-10-CM

## 2016-11-04 NOTE — Progress Notes (Signed)
   Post-Op Visit Note   Patient: Vincent [name]           Date of Birth: 06/25/1955           MRN: [id number] Visit Date: 11/04/2016 PCP: Leonard Downing, MD   Assessment & Plan: Follow-up prepatellar bursa resection for staph aureus infection. He is on Keflex and this is perfect based on sensitivities.  Chief Complaint:  Chief Complaint  Patient presents with  . Left Knee - Routine Post Op    10/27/16 I &D Left patella 8 days post op   Visit Diagnoses: Septic left knee prepatellar bursitis  Plan: Tinea Keflex. He can remove his knee immobilizer for taking a shower wash with soap water [facility] and then reapply the dressing. We discussed leaving the knee immobilizer on a good part of the day to help hold his knee in extension which will allow the incision heal quicker. He can remove it work on bending his knee a couple times a day so as needed does not get stiff. He will follow-up with Dr. Marlou Sa in 1 week.  Follow-Up Instructions: No Follow-up on file.   Orders:  No orders of the defined types were placed in this encounter.  No orders of the defined types were placed in this encounter.   Imaging: No results found.  PMFS History: Patient Active Problem List   Diagnosis Date Noted  . Diabetes mellitus with complication ([place]) 09/62/8366  . Lactic acidosis 10/28/2016  . Hypertension 10/28/2016  . Septic prepatellar bursitis 10/27/2016  . Narcotic drug use 12/07/2013  . Chronic low back pain 10/12/2010   Past Medical History:  Diagnosis Date  . Arthritis     History reviewed. No pertinent family history.  Past Surgical History:  Procedure Laterality Date  . BACK SURGERY     x2  . HERNIA REPAIR    . INCISION AND DRAINAGE OF WOUND Left 10/27/2016   Procedure: IRRIGATION AND DEBRIDEMENT LEFT PATELLA;  Surgeon: Meredith Pel, MD;  Location: [place] [facility];  Service: Orthopedics;  Laterality: Left;  . SPINE SURGERY     x2   Social History   Occupational History  . Not on  file.   Social History Main Topics  . Smoking status: Former Smoker    Quit date: 03/25/1988  . Smokeless tobacco: Never Used  . Alcohol use No  . Drug use: No  . Sexual activity: Not on file

## 2016-11-13 NOTE — Discharge Summary (Signed)
Physician Discharge Summary  Patient ID: Vincent [name] MRN: [id number] DOB/AGE: [birth year] 61 y.o.  Admit date: 10/27/2016 Discharge date: 10/29/2016  Admission Diagnoses:  Active Problems:   Septic prepatellar bursitis   Diabetes mellitus with complication (HCC)   Lactic acidosis   Hypertension   Discharge Diagnoses:  Same  Surgeries: Procedure(s): IRRIGATION AND DEBRIDEMENT LEFT PATELLA on 10/27/2016   Consultants: Treatment Team:  Minerva Ends, MD  Discharged Condition: Stable  Hospital Course: [name] is an 61 y.o. male who was admitted 10/27/2016 with a chief complaint of  Chief Complaint  Patient presents with  . Knee Pain  , and found to have a diagnosis of Prepatellar bursa infection.  They were brought to the operating room on 10/27/2016 and underwent the above named procedures.  Patient tolerated the procedure well and was transferred to the recovery room in stable condition.  He was mobilized with the nurses for weightbearing as tolerated on the affected knee.  Discharged home in good condition after the drain was pulled on appropriate oral antibiotics and will follow-up within a week.   Antibiotics given:  Anti-infectives    Start     Dose/Rate Route Frequency Ordered Stop   10/28/16 2100  ceFAZolin (ANCEF) IVPB 2g/100 mL premix  Status:  Discontinued     2 g 200 mL/hr over 30 Minutes Intravenous Every 6 hours 10/28/16 1924 10/29/16 1459   10/28/16 0400  ceFAZolin (ANCEF) IVPB 2g/100 mL premix     2 g 200 mL/hr over 30 Minutes Intravenous Every 6 hours 10/27/16 2354 10/28/16 1530   10/27/16 2219  vancomycin (VANCOCIN) powder  Status:  Discontinued       As needed 10/27/16 2245 10/27/16 2246    .  Recent vital signs:  Vitals:   10/28/16 2116 10/29/16 0439  BP: (!) 141/68 125/77  Pulse: 100 97  Resp: 18 18  Temp: 98.4 F (36.9 C) 98.6 F (37 C)  SpO2: 98% 98%    Recent laboratory studies:  Results for orders placed or performed during the  hospital encounter of 10/27/16  Culture, blood (routine x 2)  Result Value Ref Range   Specimen Description BLOOD RIGHT ANTECUBITAL    Special Requests      BOTTLES DRAWN AEROBIC AND ANAEROBIC Blood Culture adequate volume   Culture NO GROWTH 5 DAYS    Report Status 11/01/2016 FINAL   Culture, blood (routine x 2)  Result Value Ref Range   Specimen Description BLOOD LEFT HAND    Special Requests      BOTTLES DRAWN AEROBIC ONLY Blood Culture adequate volume   Culture NO GROWTH 5 DAYS    Report Status 11/01/2016 FINAL   Body fluid culture  Result Value Ref Range   Specimen Description SYNOVIAL FLUID LEFT KNEE    Special Requests NONE    Gram Stain      DIRECT SMEAR ABUNDANT WBC PRESENT,BOTH PMN AND MONONUCLEAR NO ORGANISMS SEEN Gram Stain Report Called to,Read Back By and Verified With: DR.Aline Wesche 2215 10/27/16 M.CAMPBELL    Culture NO GROWTH 3 DAYS    Report Status 10/31/2016 FINAL   Aerobic/Anaerobic Culture (surgical/deep wound)  Result Value Ref Range   Specimen Description WOUND LEFT KNEE    Special Requests BURSA SYNOVIAL CYST    Gram Stain      ABUNDANT WBC PRESENT, PREDOMINANTLY PMN MODERATE GRAM POSITIVE COCCI IN CLUSTERS    Culture      MODERATE STAPHYLOCOCCUS AUREUS NO ANAEROBES ISOLATED    Report Status  11/01/2016 FINAL    Organism ID, Bacteria STAPHYLOCOCCUS AUREUS       Susceptibility   Staphylococcus aureus - MIC*    CIPROFLOXACIN <=0.5 SENSITIVE Sensitive     ERYTHROMYCIN <=0.25 SENSITIVE Sensitive     GENTAMICIN <=0.5 SENSITIVE Sensitive     OXACILLIN 0.5 SENSITIVE Sensitive     TETRACYCLINE <=1 SENSITIVE Sensitive     VANCOMYCIN <=0.5 SENSITIVE Sensitive     TRIMETH/SULFA <=10 SENSITIVE Sensitive     CLINDAMYCIN <=0.25 SENSITIVE Sensitive     RIFAMPIN <=0.5 SENSITIVE Sensitive     Inducible Clindamycin NEGATIVE Sensitive     * MODERATE STAPHYLOCOCCUS AUREUS  Comprehensive metabolic panel  Result Value Ref Range   Sodium 127 (L) 135 - 145 mmol/L    Potassium 3.8 3.5 - 5.1 mmol/L   Chloride 94 (L) 101 - 111 mmol/L   CO2 22 22 - 32 mmol/L   Glucose, Bld 495 (H) 65 - 99 mg/dL   BUN 16 6 - 20 mg/dL   Creatinine, Ser 1.14 0.61 - 1.24 mg/dL   Calcium 9.3 8.9 - 10.3 mg/dL   Total Protein 7.2 6.5 - 8.1 g/dL   Albumin 3.3 (L) 3.5 - 5.0 g/dL   AST 17 15 - 41 U/L   ALT 21 17 - 63 U/L   Alkaline Phosphatase 94 38 - 126 U/L   Total Bilirubin 1.2 0.3 - 1.2 mg/dL   GFR calc non Af Amer >60 >60 mL/min   GFR calc Af Amer >60 >60 mL/min   Anion gap 11 5 - 15  CBC with Differential  Result Value Ref Range   WBC 9.0 4.0 - 10.5 K/uL   RBC 4.86 4.22 - 5.81 MIL/uL   Hemoglobin 14.9 13.0 - 17.0 g/dL   HCT 41.9 39.0 - 52.0 %   MCV 86.2 78.0 - 100.0 fL   MCH 30.7 26.0 - 34.0 pg   MCHC 35.6 30.0 - 36.0 g/dL   RDW 12.7 11.5 - 15.5 %   Platelets 114 (L) 150 - 400 K/uL   Neutrophils Relative % 74 %   Neutro Abs 6.7 1.7 - 7.7 K/uL   Lymphocytes Relative 18 %   Lymphs Abs 1.6 0.7 - 4.0 K/uL   Monocytes Relative 7 %   Monocytes Absolute 0.6 0.1 - 1.0 K/uL   Eosinophils Relative 1 %   Eosinophils Absolute 0.1 0.0 - 0.7 K/uL   Basophils Relative 0 %   Basophils Absolute 0.0 0.0 - 0.1 K/uL  Urinalysis, Routine w reflex microscopic  Result Value Ref Range   Color, Urine YELLOW YELLOW   APPearance CLEAR CLEAR   Specific Gravity, Urine 1.010 1.005 - 1.030   pH 5.5 5.0 - 8.0   Glucose, UA 500 (A) NEGATIVE mg/dL   Hgb urine dipstick NEGATIVE NEGATIVE   Bilirubin Urine NEGATIVE NEGATIVE   Ketones, ur NEGATIVE NEGATIVE mg/dL   Protein, ur NEGATIVE NEGATIVE mg/dL   Nitrite NEGATIVE NEGATIVE   Leukocytes, UA NEGATIVE NEGATIVE  C-reactive protein  Result Value Ref Range   CRP 23.2 (H) <1.0 mg/dL  Sedimentation rate  Result Value Ref Range   Sed Rate 66 (H) 0 - 16 mm/hr  Synovial cell count + diff, w/ crystals  Result Value Ref Range   Color, Synovial YELLOW YELLOW   Appearance-Synovial TURBID (A) CLEAR   Crystals, Fluid NO CRYSTALS SEEN    WBC,  Synovial UNABLE TO PERFORM COUNT DUE TO CLOT IN SPECIMEN 0 - 200 /cu mm   Neutrophil, Synovial 88 (  H) 0 - 25 %   Lymphocytes-Synovial Fld 3 0 - 20 %   Monocyte-Macrophage-Synovial Fluid 9 (L) 50 - 90 %  Miscellaneous [facility] test (send-out)  Result Value Ref Range   [facility] test code [phone]    [facility] test name  GLUCOSE    Source ([facility]) SYNOVIAL FLUID     Misc [facility] result COMMENT   Miscellaneous [facility] test (send-out)  Result Value Ref Range   [facility] test code [phone]    [facility] test name  TOTAL PROTEIN    Source ([facility]) SYNOVIAL FLUID     Misc [facility] result COMMENT   Glucose, capillary  Result Value Ref Range   Glucose-Capillary 304 (H) 65 - 99 mg/dL  Glucose, capillary  Result Value Ref Range   Glucose-Capillary 295 (H) 65 - 99 mg/dL   Comment 1 Notify RN   Hemoglobin A1c  Result Value Ref Range   Hgb A1c MFr Bld 8.9 (H) 4.8 - 5.6 %   Mean Plasma Glucose 209 mg/dL  Glucose, capillary  Result Value Ref Range   Glucose-Capillary 269 (H) 65 - 99 mg/dL  Glucose, capillary  Result Value Ref Range   Glucose-Capillary 304 (H) 65 - 99 mg/dL  Glucose, capillary  Result Value Ref Range   Glucose-Capillary 264 (H) 65 - 99 mg/dL  Lactic acid, plasma  Result Value Ref Range   Lactic Acid, Venous 1.4 0.5 - 1.9 mmol/L  C-peptide  Result Value Ref Range   C-Peptide 7.0 (H) 1.1 - 4.4 ng/mL  Glucose, capillary  Result Value Ref Range   Glucose-Capillary 240 (H) 65 - 99 mg/dL  Glucose, capillary  Result Value Ref Range   Glucose-Capillary 287 (H) 65 - 99 mg/dL  Glucose, capillary  Result Value Ref Range   Glucose-Capillary 212 (H) 65 - 99 mg/dL  Basic metabolic panel  Result Value Ref Range   Sodium 133 (L) 135 - 145 mmol/L   Potassium 3.6 3.5 - 5.1 mmol/L   Chloride 98 (L) 101 - 111 mmol/L   CO2 28 22 - 32 mmol/L   Glucose, Bld 314 (H) 65 - 99 mg/dL   BUN 17 6 - 20 mg/dL   Creatinine, Ser 0.98 0.61 - 1.24 mg/dL   Calcium 8.5 (L) 8.9 - 10.3 mg/dL   GFR calc non  Af Amer >60 >60 mL/min   GFR calc Af Amer >60 >60 mL/min   Anion gap 7 5 - 15  Glucose, capillary  Result Value Ref Range   Glucose-Capillary 277 (H) 65 - 99 mg/dL  Glucose, Body Fluid Other  Result Value Ref Range   Glucose, Body Fluid Other 388 mg/dL  Protein, body fluid (other)  Result Value Ref Range   Total Protein, Body Fluid Other 3.1 g/dL  I-Stat CG4 Lactic Acid, ED  Result Value Ref Range   Lactic Acid, Venous 2.49 (HH) 0.5 - 1.9 mmol/L   Comment NOTIFIED PHYSICIAN     Discharge Medications:   Allergies as of 10/29/2016   No Known Allergies     Medication List    STOP taking these medications   HYDROcodone-acetaminophen 10-325 MG tablet Commonly known as:  NORCO   ibuprofen 800 MG tablet Commonly known as:  ADVIL,MOTRIN     TAKE these medications   blood glucose meter kit and supplies Dispense based on patient and insurance preference. Use up to four times daily as directed. (FOR ICD-10 E11.8).   cyclobenzaprine 10 MG tablet Commonly known as:  FLEXERIL Take 1 tablet (10 mg total)  by mouth 3 (three) times daily as needed for muscle spasms.   diclofenac 75 MG EC tablet Commonly known as:  VOLTAREN TK 1 T PO BID   gabapentin 300 MG capsule Commonly known as:  NEURONTIN TK 1 C PO TID   metFORMIN 500 MG 24 hr tablet Commonly known as:  GLUCOPHAGE XR Take 1 with breakfast x 5 days, then 1 po BIDAC x 5 days, then 2 po BIDAC   oxyCODONE 5 MG immediate release tablet Commonly known as:  Oxy IR/ROXICODONE Take 1-2 tablets (5-10 mg total) by mouth every 4 (four) hours as needed for breakthrough pain.   sildenafil 100 MG tablet Commonly known as:  VIAGRA Take 1 tablet (100 mg total) by mouth daily as needed for erectile dysfunction.       Diagnostic Studies: Dg Knee Complete 4 Views Left  Result Date: 10/27/2016 CLINICAL DATA:  Left knee pain and swelling x2 days with previous left knee aspiration. EXAM: LEFT KNEE - COMPLETE 4+ VIEW COMPARISON:  09/20/2016  FINDINGS: Prepatellar soft tissue swelling is again noted with trace suprapatellar joint effusion. There is mild nonspecific periarticular soft tissue edema as well along the distal included thigh and proximal calf. No acute fracture dislocations. Mild medial femorotibial joint space narrowing with chondrocalcinosis hyaline cartilage within the femorotibial compartment is again noted. Atherosclerosis along the included femoral through tibial arteries. IMPRESSION: 1. Periarticular soft tissue swelling, more so prepatellar in location with small joint effusion. 2. Mild degenerative joint space narrowing of the femorotibial compartment with chondrocalcinosis of hyaline cartilage. 3. No acute osseous abnormality. Electronically Signed   By: Ashley Royalty M.D.   On: 10/27/2016 19:49    Disposition: 01-Home or Self Care  Discharge Instructions    Ambulatory referral to Nutrition and Diabetic Education    Complete by:  As directed    Call MD / Call 911    Complete by:  As directed    If you experience chest pain or shortness of breath, CALL 911 and be transported to the hospital emergency room.  If you develope a fever above 101 F, pus (white drainage) or increased drainage or redness at the wound, or calf pain, call your surgeon's office.   Constipation Prevention    Complete by:  As directed    Drink plenty of fluids.  Prune juice may be helpful.  You may use a stool softener, such as Colace (over the counter) 100 mg twice a day.  Use MiraLax (over the counter) for constipation as needed.   Diet - low sodium heart healthy    Complete by:  As directed    Discharge instructions    Complete by:  As directed    Weight bearing as tolerated with crutches Return to clinic on Friday for dressing change only   Increase activity slowly as tolerated    Complete by:  As directed          Signed: Anderson Malta 11/13/2016, 8:06 AM

## 2016-11-14 ENCOUNTER — Encounter (INDEPENDENT_AMBULATORY_CARE_PROVIDER_SITE_OTHER): Payer: Self-pay | Admitting: Orthopedic Surgery

## 2016-11-14 ENCOUNTER — Ambulatory Visit (INDEPENDENT_AMBULATORY_CARE_PROVIDER_SITE_OTHER): Payer: Self-pay | Admitting: Orthopedic Surgery

## 2016-11-14 DIAGNOSIS — M71162 Other infective bursitis, left knee: Secondary | ICD-10-CM

## 2016-11-17 NOTE — Progress Notes (Signed)
   Post-Op Visit Note   Patient: Vincent [name]           Date of Birth: 05/23/1955           MRN: [id number] Visit Date: 11/14/2016 PCP: Leonard Downing, MD   Assessment & Plan:  Chief Complaint:  Chief Complaint  Patient presents with  . Left Knee - Routine Post Op   Visit Diagnoses: No diagnosis found.  Plan: [name] is a 61 year old patient who underwent I&D left patella 10/27/2016.  He is doing some better.  Still is having pain.  He tried to be careful with the flexion and extension.  He is off antibiotics.  On exam all incisions appear intact.  No fluctuance is noted.  A lengthy okay for him to return to work on Monday.  No kneeling with direct pressure on that knee for 2 more weeks.  Follow-up with me as needed.  Follow-Up Instructions: Return if symptoms worsen or fail to improve.   Orders:  No orders of the defined types were placed in this encounter.  No orders of the defined types were placed in this encounter.   Imaging: No results found.  PMFS History: Patient Active Problem List   Diagnosis Date Noted  . Diabetes mellitus with complication ([place]) 76/19/5093  . Lactic acidosis 10/28/2016  . Hypertension 10/28/2016  . Septic prepatellar bursitis 10/27/2016  . Narcotic drug use 12/07/2013  . Chronic low back pain 10/12/2010   Past Medical History:  Diagnosis Date  . Arthritis     No family history on file.  Past Surgical History:  Procedure Laterality Date  . BACK SURGERY     x2  . HERNIA REPAIR    . INCISION AND DRAINAGE OF WOUND Left 10/27/2016   Procedure: IRRIGATION AND DEBRIDEMENT LEFT PATELLA;  Surgeon: Meredith Pel, MD;  Location: [place];  Service: Orthopedics;  Laterality: Left;  . SPINE SURGERY     x2   Social History   Occupational History  . Not on file.   Social History Main Topics  . Smoking status: Former Smoker    Quit date: 03/25/1988  . Smokeless tobacco: Never Used  . Alcohol use No  . Drug use: No  . Sexual activity:  Not on file

## 2017-11-18 ENCOUNTER — Other Ambulatory Visit: Payer: Self-pay | Admitting: Neurological Surgery

## 2017-11-18 DIAGNOSIS — M542 Cervicalgia: Secondary | ICD-10-CM

## 2019-08-17 ENCOUNTER — Encounter (HOSPITAL_COMMUNITY): Payer: Self-pay

## 2019-08-17 ENCOUNTER — Emergency Department (HOSPITAL_COMMUNITY): Payer: No Typology Code available for payment source

## 2019-08-17 ENCOUNTER — Other Ambulatory Visit: Payer: Self-pay

## 2019-08-17 ENCOUNTER — Emergency Department (HOSPITAL_COMMUNITY)
Admission: EM | Admit: 2019-08-17 | Discharge: 2019-08-17 | Disposition: A | Discharge status: Home / Self Care | Payer: No Typology Code available for payment source | Attending: Emergency Medicine | Admitting: Emergency Medicine

## 2019-08-17 DIAGNOSIS — Y999 Unspecified external cause status: Secondary | ICD-10-CM | POA: Diagnosis not present

## 2019-08-17 DIAGNOSIS — S299XXA Unspecified injury of thorax, initial encounter: Secondary | ICD-10-CM | POA: Insufficient documentation

## 2019-08-17 DIAGNOSIS — M546 Pain in thoracic spine: Secondary | ICD-10-CM | POA: Diagnosis not present

## 2019-08-17 DIAGNOSIS — Y9389 Activity, other specified: Secondary | ICD-10-CM | POA: Insufficient documentation

## 2019-08-17 DIAGNOSIS — Z7984 Long term (current) use of oral hypoglycemic drugs: Secondary | ICD-10-CM | POA: Insufficient documentation

## 2019-08-17 DIAGNOSIS — S199XXA Unspecified injury of neck, initial encounter: Secondary | ICD-10-CM | POA: Diagnosis present

## 2019-08-17 DIAGNOSIS — Y92410 Unspecified street and highway as the place of occurrence of the external cause: Secondary | ICD-10-CM | POA: Insufficient documentation

## 2019-08-17 DIAGNOSIS — E119 Type 2 diabetes mellitus without complications: Secondary | ICD-10-CM | POA: Diagnosis not present

## 2019-08-17 DIAGNOSIS — S161XXA Strain of muscle, fascia and tendon at neck level, initial encounter: Secondary | ICD-10-CM | POA: Diagnosis not present

## 2019-08-17 MED ORDER — METHOCARBAMOL 500 MG PO TABS: 500.0000 mg | ORAL_TABLET | Freq: Two times a day (BID) | ORAL | 0 refills | Status: DC

## 2019-08-17 NOTE — ED Provider Notes (Signed)
Assumed care of patient at 5 PM.  Patient's cervical spine is negative for acute fracture.  Patient is under a pain contract and has pain medication at home but was given a refill of muscle relaxer.   Blanchie Dessert, MD 08/17/19 [phone]

## 2019-08-17 NOTE — Discharge Instructions (Signed)
The CAT scan today showed no sign of broken bones in your neck however you do have strain in your neck and it is going to be sore for the next several days.  The x-rays also of your mid back were okay.
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
## 2019-08-17 NOTE — ED Notes (Signed)
An After Visit Summary was printed and given to the patient. Discharge instructions given and no further questions at this time.  

## 2019-10-25 NOTE — Progress Notes (Signed)
Your procedure is scheduled on Monday, August 9th 2021.  Report to [facility] Main Entrance "A" at 7:30 A.M., and check in at the Admitting office.  Call this number if you have problems the morning of surgery:  [phone]  Call [phone] if you have any questions prior to your surgery date Monday-Friday 8am-4pm   Remember:  Do not eat or drink after midnight the night before your surgery   Take these medicines the morning of surgery with A SIP OF WATER   If needed - Oxycodone   As of today, STOP taking any Aspirin (unless otherwise instructed by your surgeon), diclofenac (VOLTAREN),  Aleve, Naproxen, Ibuprofen, Motrin, Advil, Goody's, BC's, all herbal medications, fish oil, and all vitamins.  WHAT DO I DO ABOUT MY DIABETES MEDICATION?  ---The morning of surgery--- Do NOT take metFORMIN (GLUCOPHAGE XR)   HOW TO MANAGE YOUR DIABETES BEFORE AND AFTER SURGERY  Why is it important to control my blood sugar before and after surgery? . Improving blood sugar levels before and after surgery helps healing and can limit problems. . A way of improving blood sugar control is eating a healthy diet by: o  Eating less sugar and carbohydrates o  Increasing activity/exercise o  Talking with your doctor about reaching your blood sugar goals . High blood sugars (greater than 180 mg/dL) can raise your risk of infections and slow your recovery, so you will need to focus on controlling your diabetes during the weeks before surgery. . Make sure that the doctor who takes care of your diabetes knows about your planned surgery including the date and location.  How do I manage my blood sugar before surgery? . Check your blood sugar at least 4 times a day, starting 2 days before surgery, to make sure that the level is not too high or low. . Check your blood sugar the morning of your surgery when you wake up and every 2 hours until you get to the Short Stay unit. o If your blood sugar is less than 70  mg/dL, you will need to treat for low blood sugar: - Do not take insulin. - Treat a low blood sugar (less than 70 mg/dL) with  cup of clear juice (cranberry or apple), 4 glucose tablets, OR glucose gel. - Recheck blood sugar in 15 minutes after treatment (to make sure it is greater than 70 mg/dL). If your blood sugar is not greater than 70 mg/dL on recheck, call [phone] for further instructions. . Report your blood sugar to the short stay nurse when you get to Short Stay.  . If you are admitted to the hospital after surgery: o Your blood sugar will be checked by the staff and you will probably be given insulin after surgery (instead of oral diabetes medicines) to make sure you have good blood sugar levels. o The goal for blood sugar control after surgery is 80-180 mg/dL.                     Do not wear jewelry, make up, or nail polish            Do not wear lotions, powders, perfumes/colognes, or deodorant.            Do not shave 48 hours prior to surgery.  Men may shave face and neck.            Do not bring valuables to the hospital.  [facility] is not responsible for any belongings or valuables.  Do NOT Smoke (Tobacco/Vaping) or drink Alcohol 24 hours prior to your procedure If you use a CPAP at night, you may bring all equipment for your overnight stay.   Contacts, glasses, dentures or bridgework may not be worn into surgery.      For patients admitted to the hospital, discharge time will be determined by your treatment team.   Patients discharged the day of surgery will not be allowed to drive home, and someone needs to stay with them for 24 hours.  Special instructions:   [facility]- Preparing For Surgery  Before surgery, you can play an important role. Because skin is not sterile, your skin needs to be as free of germs as possible. You can reduce the number of germs on your skin by washing with CHG (chlorahexidine gluconate) Soap before surgery.  CHG is an  antiseptic cleaner which kills germs and bonds with the skin to continue killing germs even after washing.    Oral Hygiene is also important to reduce your risk of infection.  Remember - BRUSH YOUR TEETH THE MORNING OF SURGERY WITH YOUR REGULAR TOOTHPASTE  Please do not use if you have an allergy to CHG or antibacterial soaps. If your skin becomes reddened/irritated stop using the CHG.  Do not shave (including legs and underarms) for at least 48 hours prior to first CHG shower. It is OK to shave your face.  Please follow these instructions carefully.   1. Shower the NIGHT BEFORE SURGERY and the MORNING OF SURGERY with CHG Soap.   2. If you chose to wash your hair, wash your hair first as usual with your normal shampoo.  3. After you shampoo, rinse your hair and body thoroughly to remove the shampoo.  4. Use CHG as you would any other liquid soap. You can apply CHG directly to the skin and wash gently with a scrungie or a clean washcloth.   5. Apply the CHG Soap to your body ONLY FROM THE NECK DOWN.  Do not use on open wounds or open sores. Avoid contact with your eyes, ears, mouth and genitals (private parts). Wash Face and genitals (private parts)  with your normal soap.   6. Wash thoroughly, paying special attention to the area where your surgery will be performed.  7. Thoroughly rinse your body with warm water from the neck down.  8. DO NOT shower/wash with your normal soap after using and rinsing off the CHG Soap.  9. Pat yourself dry with a CLEAN TOWEL.  10. Wear CLEAN PAJAMAS to bed the night before surgery  11. Place CLEAN SHEETS on your bed the night of your first shower and DO NOT SLEEP WITH PETS.  Day of Surgery: Wear Clean/Comfortable clothing the morning of surgery Do not apply any deodorants/lotions.   Remember to brush your teeth WITH YOUR REGULAR TOOTHPASTE.   Please read over the following fact sheets that you were given.

## 2019-10-26 ENCOUNTER — Other Ambulatory Visit: Payer: Self-pay

## 2019-10-26 ENCOUNTER — Encounter (HOSPITAL_COMMUNITY): Payer: Self-pay

## 2019-10-26 ENCOUNTER — Encounter (HOSPITAL_COMMUNITY)
Admission: RE | Admit: 2019-10-26 | Discharge: 2019-10-26 | Disposition: A | Discharge status: Home / Self Care | Payer: Self-pay | Source: Ambulatory Visit | Attending: Otolaryngology | Admitting: Otolaryngology

## 2019-10-26 DIAGNOSIS — Z01810 Encounter for preprocedural cardiovascular examination: Secondary | ICD-10-CM | POA: Insufficient documentation

## 2019-10-26 DIAGNOSIS — Z01812 Encounter for preprocedural laboratory examination: Secondary | ICD-10-CM | POA: Insufficient documentation

## 2019-10-26 DIAGNOSIS — E119 Type 2 diabetes mellitus without complications: Secondary | ICD-10-CM | POA: Insufficient documentation

## 2019-10-26 HISTORY — DX: Cardiac murmur, unspecified: R01.1

## 2019-10-26 HISTORY — DX: Other complications of anesthesia, initial encounter: T88.59XA

## 2019-10-26 HISTORY — DX: Type 2 diabetes mellitus without complications: E11.9

## 2019-10-26 HISTORY — DX: Neuralgia and neuritis, unspecified: M79.2

## 2019-10-26 HISTORY — DX: Basal cell carcinoma of skin of unspecified parts of face: C44.310

## 2019-10-26 LAB — PROTIME-INR
INR: 1.1 (ref 0.8–1.2)
Prothrombin Time: 13.8 seconds (ref 11.4–15.2)

## 2019-10-26 LAB — APTT: aPTT: 26 seconds (ref 24–36)

## 2019-10-26 LAB — BASIC METABOLIC PANEL
Anion gap: 8 (ref 5–15)
BUN: 18 mg/dL (ref 8–23)
CO2: 24 mmol/L (ref 22–32)
Calcium: 9.3 mg/dL (ref 8.9–10.3)
Chloride: 105 mmol/L (ref 98–111)
Creatinine, Ser: 1.21 mg/dL (ref 0.61–1.24)
GFR calc Af Amer: >60 mL/min (ref >60)
GFR calc non Af Amer: >60 mL/min (ref >60)
Glucose, Bld: 225 mg/dL — ABNORMAL HIGH (ref 70–99)
Potassium: 4.3 mmol/L (ref 3.5–5.1)
Sodium: 137 mmol/L (ref 135–145)

## 2019-10-26 LAB — HEMOGLOBIN A1C
Hgb A1c MFr Bld: 7.2 % — ABNORMAL HIGH (ref 4.8–5.6)
Mean Plasma Glucose: 159.94 mg/dL

## 2019-10-26 LAB — CBC
HCT: 38.3 % — ABNORMAL LOW (ref 39.0–52.0)
Hemoglobin: 13.4 g/dL (ref 13.0–17.0)
MCH: 30.9 pg (ref 26.0–34.0)
MCHC: 35 g/dL (ref 30.0–36.0)
MCV: 88.2 fL (ref 80.0–100.0)
Platelets: 124 10*3/uL — ABNORMAL LOW (ref 150–400)
RBC: 4.34 MIL/uL (ref 4.22–5.81)
RDW: 12.9 % (ref 11.5–15.5)
WBC: 6.3 10*3/uL (ref 4.0–10.5)
nRBC: 0 % (ref 0.0–0.2)

## 2019-10-26 LAB — GLUCOSE, CAPILLARY: Glucose-Capillary: 259 mg/dL — ABNORMAL HIGH (ref 70–99)

## 2019-10-26 NOTE — H&P (Signed)
HPI:   Vincent [name] is a 64 y.o. male who presents as a consult Patient.   Referring Provider: Leonard Downing, *  Chief complaint: Skin cancer on face.  HPI: He has had a lesion on the left cheek for about a year. He had a biopsy about a month or 2 ago which reportedly revealed basal cell cancer. He is referred here for management. Otherwise in good health. He does not work out in the sun. He has not had skin cancer before.  PMH/Meds/All/SocHx/FamHx/ROS:   Past Medical History:  Diagnosis Date  . Diabetes mellitus ([place])   Past Surgical History:  Procedure Laterality Date  . BACK SURGERY  . HERNIA REPAIR  . KNEE SURGERY   No family history of bleeding disorders, wound healing problems or difficulty with anesthesia.   Social History   Socioeconomic History  . Marital status: Unknown  Spouse name: Not on file  . Number of children: Not on file  . Years of education: Not on file  . Highest education level: Not on file  Occupational History  . Not on file  Tobacco Use  . Smoking status: Former Smoker  Quit date: 1995  Years since quitting: 26.5  . Smokeless tobacco: Never Used  Substance and Sexual Activity  . Alcohol use: Never  . Drug use: Not on file  . Sexual activity: Not on file  Other Topics Concern  . Not on file  Social History Narrative  . Not on file   Social Determinants of Health   Financial Resource Strain:  . Difficulty of Paying Living Expenses:  Food Insecurity:  . Worried About Charity fundraiser in the Last Year:  . Arboriculturist in the Last Year:  Transportation Needs:  . Film/video editor (Medical):  Marland Kitchen Lack of Transportation (Non-Medical):  Physical Activity:  . Days of Exercise per Week:  . Minutes of Exercise per Session:  Stress:  . Feeling of Stress :  Social Connections:  . Frequency of Communication with Friends and Family:  . Frequency of Social Gatherings with Friends and Family:  . Attends Religious Services:   . Active Member of Clubs or Organizations:  . Attends Archivist Meetings:  Marland Kitchen Marital Status:   Current Outpatient Medications:  . diclofenac (VOLTAREN) 50 MG EC tablet, Take 50 mg by mouth 2 times daily., Disp: , Rfl:  . metFORMIN (GLUCOPHAGE) 500 MG tablet, Take 500 mg by mouth 2 times daily with meals., Disp: , Rfl:  . oxyCODONE (ROXICODONE) 10 mg IR tablet, Take 10 mg by mouth every 4 (four) hours as needed., Disp: , Rfl:  . methocarbamoL (ROBAXIN) 500 MG tablet, Take 500 mg by mouth., Disp: , Rfl:   A complete ROS was performed with pertinent positives/negatives noted in the HPI. The remainder of the ROS are negative.   Physical Exam:   BP 121/75  Ht 1.765 m (5' 9.5")  Wt 81.3 kg (179 lb 3.2 oz)  BMI 26.08 kg/m   General: Healthy and alert, in no distress, breathing easily. Normal affect. In a pleasant mood. Head: Normocephalic, atraumatic. No masses, or scars. Eyes: Pupils are equal, and reactive to light. Vision is grossly intact. No spontaneous or gaze nystagmus. Ears: Ear canals are clear. Tympanic membranes are intact, with normal landmarks and the middle ears are clear and healthy. Hearing: Grossly normal. Nose: Nasal cavities are clear with healthy mucosa, no polyps or exudate. Airways are patent. Face: 2 cm ulcerative mass in the posterior  mid cheek region, facial nerve function is symmetric. Oral Cavity: No mucosal abnormalities are noted. Tongue with normal mobility. Dentition appears healthy. Oropharynx: Tonsils are symmetric. There are no mucosal masses identified. Tongue base appears normal and healthy. Larynx/Hypopharynx: deferred Chest: Deferred Neck: No palpable masses, no cervical adenopathy, no thyroid nodules or enlargement. Neuro: Cranial nerves II-XII with normal function. Balance: Normal gate. Other findings: none.  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 Recommend adequate excision of this, possible skin graft, possible parotidectomy with facial nerve dissection to protect the facial nerve integrity. We will schedule as soon as possible.  Beckie Salts, MD   Electronically signed by: Beckie Salts, MD 10/18/19 1511  I have reviewed the pathology report from the skin surgery center. This is a nodular type basal cell carcinoma. We will proceed with surgical treatment as discussed earlier.
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
## 2019-10-26 NOTE — Progress Notes (Signed)
PCP - Dr. Claris Gower ([place]) Cardiologist - denies  PPM/ICD - denies  Chest x-ray - N/A EKG - 10/26/2019 Stress Test - denies ECHO - denies Cardiac Cath - denies  Sleep Study - denies CPAP - N/A  DM: Yes, per patient, does not check blood sugars. CBG @ PAT appointment: [id number]  Blood Thinner Instructions: N/A Aspirin Instructions: N/A  ERAS Protcol - No  COVID TEST- Scheduled for 10/29/2019. Patient verbalized understanding of self-quarantine instructions, appointment time and place.  Anesthesia review: YES, records requested from PCP;         Patient denies shortness of breath, fever, cough and chest pain at PAT appointment  All instructions explained to the patient, with a verbal understanding of the material. Patient agrees to go over the instructions while at home for a better understanding. Patient also instructed to self quarantine after being tested for COVID-19. The opportunity to ask questions was provided.

## 2019-10-26 NOTE — Anesthesia Preprocedure Evaluation (Addendum)
Anesthesia Evaluation  Patient identified by MRN, date of birth, ID band Patient awake    Reviewed: Allergy & Precautions, NPO status , Patient's Chart, lab work & pertinent test results  Airway Mallampati: II  TM Distance: >3 FB Neck ROM: Full    Dental  (+) Poor Dentition, Dental Advisory Given,  Very poor dentition:   Pulmonary former smoker,  Quit smoking 1990   Pulmonary exam normal breath sounds clear to auscultation       Cardiovascular negative cardio ROS Normal cardiovascular exam Rhythm:Regular Rate:Normal     Neuro/Psych negative neurological ROS  negative psych ROS   GI/Hepatic negative GI ROS, Neg liver ROS,   Endo/Other  diabetes, Poorly Controlled, Type 2, Oral Hypoglycemic AgentsLast a1c 7.2, but FS have been high recently  Renal/GU negative Renal ROS  negative genitourinary   Musculoskeletal  (+) Arthritis , Osteoarthritis,    Abdominal   Peds  Hematology negative hematology ROS (+)   Anesthesia Other Findings Left face skin ca  Reproductive/Obstetrics negative OB ROS                            Anesthesia Physical Anesthesia Plan  ASA: III  Anesthesia Plan: General   Post-op Pain Management:    Induction: Intravenous  PONV Risk Score and Plan: 2 and Ondansetron, Dexamethasone, Midazolam and Treatment may vary due to age or medical condition  Airway Management Planned: Oral ETT  Additional Equipment: None  Intra-op Plan:   Post-operative Plan: Extubation in OR  Informed Consent: I have reviewed the patients History and Physical, chart, labs and discussed the procedure including the risks, benefits and alternatives for the proposed anesthesia with the patient or authorized representative who has indicated his/her understanding and acceptance.     Dental advisory given  Plan Discussed with: CRNA  Anesthesia Plan Comments:        Anesthesia Quick  Evaluation

## 2019-10-29 ENCOUNTER — Other Ambulatory Visit (HOSPITAL_COMMUNITY)
Admission: RE | Admit: 2019-10-29 | Discharge: 2019-10-29 | Disposition: A | Discharge status: Home / Self Care | Payer: HRSA Program | Source: Ambulatory Visit | Attending: Otolaryngology | Admitting: Otolaryngology

## 2019-10-29 DIAGNOSIS — Z01812 Encounter for preprocedural laboratory examination: Secondary | ICD-10-CM | POA: Insufficient documentation

## 2019-10-29 DIAGNOSIS — Z20822 Contact with and (suspected) exposure to covid-19: Secondary | ICD-10-CM | POA: Insufficient documentation

## 2019-10-29 LAB — SARS CORONAVIRUS 2 (TAT 6-24 HRS): SARS Coronavirus 2: NEGATIVE

## 2019-11-01 ENCOUNTER — Encounter (HOSPITAL_COMMUNITY)
Admission: RE | Disposition: A | Discharge status: Home / Self Care | Payer: Self-pay | Source: Home / Self Care | Attending: Otolaryngology

## 2019-11-01 ENCOUNTER — Ambulatory Visit (HOSPITAL_COMMUNITY)
Admission: RE | Admit: 2019-11-01 | Discharge: 2019-11-01 | Disposition: A | Discharge status: Home / Self Care | Payer: Self-pay | Attending: Otolaryngology | Admitting: Otolaryngology

## 2019-11-01 ENCOUNTER — Ambulatory Visit (HOSPITAL_COMMUNITY): Payer: Self-pay | Admitting: Vascular Surgery

## 2019-11-01 ENCOUNTER — Encounter (HOSPITAL_COMMUNITY): Payer: Self-pay | Admitting: Otolaryngology

## 2019-11-01 ENCOUNTER — Other Ambulatory Visit: Payer: Self-pay

## 2019-11-01 ENCOUNTER — Ambulatory Visit (HOSPITAL_COMMUNITY): Payer: Self-pay | Admitting: Certified Registered"

## 2019-11-01 DIAGNOSIS — Z7984 Long term (current) use of oral hypoglycemic drugs: Secondary | ICD-10-CM | POA: Insufficient documentation

## 2019-11-01 DIAGNOSIS — M1990 Unspecified osteoarthritis, unspecified site: Secondary | ICD-10-CM | POA: Insufficient documentation

## 2019-11-01 DIAGNOSIS — Z87891 Personal history of nicotine dependence: Secondary | ICD-10-CM | POA: Insufficient documentation

## 2019-11-01 DIAGNOSIS — E119 Type 2 diabetes mellitus without complications: Secondary | ICD-10-CM | POA: Insufficient documentation

## 2019-11-01 DIAGNOSIS — Z791 Long term (current) use of non-steroidal anti-inflammatories (NSAID): Secondary | ICD-10-CM | POA: Insufficient documentation

## 2019-11-01 DIAGNOSIS — C44310 Basal cell carcinoma of skin of unspecified parts of face: Secondary | ICD-10-CM | POA: Insufficient documentation

## 2019-11-01 HISTORY — PX: LESION REMOVAL: SHX5196

## 2019-11-01 LAB — GLUCOSE, CAPILLARY
Glucose-Capillary: 155 mg/dL — ABNORMAL HIGH (ref 70–99)
Glucose-Capillary: 135 mg/dL — ABNORMAL HIGH (ref 70–99)

## 2019-11-01 SURGERY — WIDE EXCISION, LESION, UPPER EXTREMITY
Anesthesia: General | Site: Face | Laterality: Left

## 2019-11-01 MED ORDER — MIDAZOLAM HCL 2 MG/2ML IJ SOLN
INTRAMUSCULAR | Status: AC
  Filled 2019-11-01: qty 2

## 2019-11-01 MED ORDER — FENTANYL CITRATE (PF) 250 MCG/5ML IJ SOLN
INTRAMUSCULAR | Status: AC
  Filled 2019-11-01: qty 5

## 2019-11-01 MED ORDER — PROMETHAZINE HCL 25 MG RE SUPP
25.0000 mg | Freq: Four times a day (QID) | RECTAL | 1 refills | Status: DC | PRN
Start: 2019-11-01 — End: 2021-05-18

## 2019-11-01 MED ORDER — CEPHALEXIN 500 MG PO CAPS: 500.0000 mg | ORAL_CAPSULE | Freq: Three times a day (TID) | ORAL | 0 refills | Status: DC

## 2019-11-01 MED ORDER — BACITRACIN ZINC 500 UNIT/GM EX OINT
TOPICAL_OINTMENT | CUTANEOUS | Status: DC | PRN
  Administered 2019-11-01: 1 via TOPICAL

## 2019-11-01 MED ORDER — ORAL CARE MOUTH RINSE: 15.0000 mL | Freq: Once | OROMUCOSAL | Status: AC

## 2019-11-01 MED ORDER — 0.9 % SODIUM CHLORIDE (POUR BTL) OPTIME
TOPICAL | Status: DC | PRN
  Administered 2019-11-01: 1000 mL

## 2019-11-01 MED ORDER — OXYCODONE HCL 5 MG/5ML PO SOLN: 5.0000 mg | Freq: Once | ORAL | Status: DC | PRN

## 2019-11-01 MED ORDER — HYDROCODONE-ACETAMINOPHEN 7.5-325 MG PO TABS: 1.0000 | ORAL_TABLET | Freq: Four times a day (QID) | ORAL | 0 refills | Status: DC | PRN

## 2019-11-01 MED ORDER — CHLORHEXIDINE GLUCONATE 0.12 % MT SOLN
15.0000 mL | Freq: Once | OROMUCOSAL | Status: AC
  Administered 2019-11-01: 15 mL via OROMUCOSAL
  Filled 2019-11-01: qty 15

## 2019-11-01 MED ORDER — ROCURONIUM BROMIDE 10 MG/ML (PF) SYRINGE
PREFILLED_SYRINGE | INTRAVENOUS | Status: DC | PRN
  Administered 2019-11-01: 60 mg via INTRAVENOUS

## 2019-11-01 MED ORDER — PHENYLEPHRINE HCL-NACL 10-0.9 MG/250ML-% IV SOLN
INTRAVENOUS | Status: DC | PRN
Start: 2019-11-01 — End: 2019-11-01
  Administered 2019-11-01: 50 ug/min via INTRAVENOUS

## 2019-11-01 MED ORDER — PHENYLEPHRINE HCL (PRESSORS) 10 MG/ML IV SOLN
INTRAVENOUS | Status: DC | PRN
  Administered 2019-11-01 (×2): 80 ug via INTRAVENOUS

## 2019-11-01 MED ORDER — HYDROMORPHONE HCL 1 MG/ML IJ SOLN
INTRAMUSCULAR | Status: AC
  Filled 2019-11-01: qty 1

## 2019-11-01 MED ORDER — LIDOCAINE-EPINEPHRINE 1 %-1:100000 IJ SOLN
INTRAMUSCULAR | Status: AC
  Filled 2019-11-01: qty 1

## 2019-11-01 MED ORDER — ONDANSETRON HCL 4 MG/2ML IJ SOLN
INTRAMUSCULAR | Status: DC | PRN
  Administered 2019-11-01: 4 mg via INTRAVENOUS

## 2019-11-01 MED ORDER — PROPOFOL 10 MG/ML IV BOLUS
INTRAVENOUS | Status: DC | PRN
  Administered 2019-11-01: 150 mg via INTRAVENOUS

## 2019-11-01 MED ORDER — SUGAMMADEX SODIUM 200 MG/2ML IV SOLN
INTRAVENOUS | Status: DC | PRN
  Administered 2019-11-01: 200 mg via INTRAVENOUS

## 2019-11-01 MED ORDER — ACETAMINOPHEN 500 MG PO TABS: 1000.0000 mg | ORAL_TABLET | Freq: Once | ORAL | Status: DC

## 2019-11-01 MED ORDER — BACITRACIN ZINC 500 UNIT/GM EX OINT
TOPICAL_OINTMENT | CUTANEOUS | Status: AC
  Filled 2019-11-01: qty 28.35

## 2019-11-01 MED ORDER — HYDROMORPHONE HCL 1 MG/ML IJ SOLN
0.2500 mg | INTRAMUSCULAR | Status: DC | PRN
  Administered 2019-11-01 (×2): 0.5 mg via INTRAVENOUS

## 2019-11-01 MED ORDER — LIDOCAINE-EPINEPHRINE 1 %-1:100000 IJ SOLN
INTRAMUSCULAR | Status: DC | PRN
  Administered 2019-11-01: 3 mL

## 2019-11-01 MED ORDER — LACTATED RINGERS IV SOLN: INTRAVENOUS | Status: DC

## 2019-11-01 MED ORDER — ONDANSETRON HCL 4 MG/2ML IJ SOLN: 4.0000 mg | Freq: Once | INTRAMUSCULAR | Status: DC | PRN

## 2019-11-01 MED ORDER — DEXAMETHASONE SODIUM PHOSPHATE 10 MG/ML IJ SOLN
INTRAMUSCULAR | Status: DC | PRN
  Administered 2019-11-01: 5 mg via INTRAVENOUS

## 2019-11-01 MED ORDER — OXYCODONE HCL 5 MG PO TABS: 5.0000 mg | ORAL_TABLET | Freq: Once | ORAL | Status: DC | PRN

## 2019-11-01 MED ORDER — MIDAZOLAM HCL 5 MG/5ML IJ SOLN
INTRAMUSCULAR | Status: DC | PRN
  Administered 2019-11-01: 2 mg via INTRAVENOUS

## 2019-11-01 MED ORDER — LIDOCAINE 2% (20 MG/ML) 5 ML SYRINGE
INTRAMUSCULAR | Status: DC | PRN
  Administered 2019-11-01: 60 mg via INTRAVENOUS

## 2019-11-01 MED ORDER — ROCURONIUM BROMIDE 10 MG/ML (PF) SYRINGE
PREFILLED_SYRINGE | INTRAVENOUS | Status: AC
  Filled 2019-11-01: qty 10

## 2019-11-01 MED ORDER — PROPOFOL 10 MG/ML IV BOLUS
INTRAVENOUS | Status: AC
  Filled 2019-11-01: qty 20

## 2019-11-01 MED ORDER — FENTANYL CITRATE (PF) 250 MCG/5ML IJ SOLN
INTRAMUSCULAR | Status: DC | PRN
  Administered 2019-11-01: 50 ug via INTRAVENOUS

## 2019-11-01 SURGICAL SUPPLY — 49 items
ATTRACTOMAT 16X20 MAGNETIC DRP (DRAPES) IMPLANT
BLADE CLIPPER SURG (BLADE) IMPLANT
BLADE SURG 15 STRL LF DISP TIS (BLADE) ×4 IMPLANT
BLADE SURG 15 STRL SS (BLADE) ×8
CANISTER SUCT 3000ML PPV (MISCELLANEOUS) ×8 IMPLANT
CLEANER TIP ELECTROSURG 2X2 (MISCELLANEOUS) ×8 IMPLANT
CNTNR URN SCR LID CUP LEK RST (MISCELLANEOUS) IMPLANT
CONT SPEC 4OZ STRL OR WHT (MISCELLANEOUS)
CORD BIPOLAR FORCEPS 12FT (ELECTRODE) ×4 IMPLANT
COVER SURGICAL LIGHT HANDLE (MISCELLANEOUS) ×8 IMPLANT
COVER WAND RF STERILE (DRAPES) ×4 IMPLANT
DERMABOND ADVANCED (GAUZE/BANDAGES/DRESSINGS) ×4
DERMABOND ADVANCED .7 DNX12 (GAUZE/BANDAGES/DRESSINGS) ×4 IMPLANT
DRAIN HEMOVAC 7FR (DRAIN) IMPLANT
DRAIN SNY 10 ROU (WOUND CARE) IMPLANT
DRAIN WOUND SNY 15 RND (WOUND CARE) IMPLANT
DRAPE HALF SHEET 40X57 (DRAPES) ×4 IMPLANT
DRAPE INCISE 23X17 IOBAN STRL (DRAPES) ×4
DRAPE INCISE IOBAN 23X17 STRL (DRAPES) ×4 IMPLANT
ELECT COATED BLADE 2.86 ST (ELECTRODE) ×8 IMPLANT
ELECT REM PT RETURN 9FT ADLT (ELECTROSURGICAL) ×8
ELECTRODE REM PT RTRN 9FT ADLT (ELECTROSURGICAL) ×4 IMPLANT
EVACUATOR SILICONE 100CC (DRAIN) IMPLANT
FORCEPS BIPOLAR SPETZLER 8 1.0 (NEUROSURGERY SUPPLIES) ×8 IMPLANT
GAUZE 4X4 16PLY RFD (DISPOSABLE) ×8 IMPLANT
GLOVE ECLIPSE 7.5 STRL STRAW (GLOVE) ×8 IMPLANT
GOWN STRL REUS W/ TWL LRG LVL3 (GOWN DISPOSABLE) ×8 IMPLANT
GOWN STRL REUS W/TWL LRG LVL3 (GOWN DISPOSABLE) ×8
KIT BASIN OR (CUSTOM PROCEDURE TRAY) ×8 IMPLANT
KIT TURNOVER KIT B (KITS) ×8 IMPLANT
LOCATOR NERVE 3 VOLT (DISPOSABLE) IMPLANT
NEEDLE PRECISIONGLIDE 27X1.5 (NEEDLE) ×4 IMPLANT
NS IRRIG 1000ML POUR BTL (IV SOLUTION) ×8 IMPLANT
PAD ARMBOARD 7.5X6 YLW CONV (MISCELLANEOUS) ×16 IMPLANT
PENCIL FOOT CONTROL (ELECTRODE) ×8 IMPLANT
SHEARS HARMONIC 9CM CVD (BLADE) ×4 IMPLANT
STAPLER VISISTAT 35W (STAPLE) ×8 IMPLANT
SUT CHROMIC 3 0 SH 27 (SUTURE) ×12 IMPLANT
SUT CHROMIC 4 0 PS 2 18 (SUTURE) ×4 IMPLANT
SUT ETHILON 5 0 P 3 18 (SUTURE)
SUT NYLON ETHILON 5-0 P-3 1X18 (SUTURE) IMPLANT
SUT SILK 2 0 SH CR/8 (SUTURE) ×4 IMPLANT
SUT SILK 4 0 REEL (SUTURE) ×8 IMPLANT
SUT VIC AB 3-0 SH 18 (SUTURE) ×4 IMPLANT
SUT VICRYL RAPIDE 5/0 PC 1 (SUTURE) ×8 IMPLANT
SYR CONTROL 10ML LL (SYRINGE) IMPLANT
TOWEL GREEN STERILE FF (TOWEL DISPOSABLE) ×8 IMPLANT
TRAY ENT MC OR (CUSTOM PROCEDURE TRAY) ×8 IMPLANT
TRAY FOLEY MTR SLVR 14FR STAT (SET/KITS/TRAYS/PACK) IMPLANT

## 2019-11-01 NOTE — Anesthesia Postprocedure Evaluation (Signed)
Anesthesia Post Note  Patient: Vincent [name]  Procedure(s) Performed: LEFT FACIAL LESION REMOVAL (Left Face)     Patient location during evaluation: PACU Anesthesia Type: General Level of consciousness: awake and alert, oriented and patient cooperative Pain management: pain level controlled Vital Signs Assessment: post-procedure vital signs reviewed and stable Respiratory status: spontaneous breathing, nonlabored ventilation and respiratory function stable Cardiovascular status: blood pressure returned to baseline and stable Postop Assessment: no apparent nausea or vomiting Anesthetic complications: no   No complications documented.  Last Vitals:  Vitals:   11/01/19 0753 11/01/19 1117  BP: 129/76 135/77  Pulse: 86 87  Resp: 18 13  Temp: 36.9 C 36.7 C  SpO2: 98% 100%    Last Pain:  Vitals:   11/01/19 1117  PainSc: [place]

## 2019-11-01 NOTE — Discharge Instructions (Signed)
You may shower and use soap and water. Do not use any creams, oils or ointment. ° °

## 2019-11-01 NOTE — Transfer of Care (Signed)
Immediate Anesthesia Transfer of Care Note  Patient: Vincent [name]  Procedure(s) Performed: LEFT FACIAL LESION REMOVAL (Left Face)  Patient Location: PACU  Anesthesia Type:General  Level of Consciousness: awake, alert , oriented and patient cooperative  Airway & Oxygen Therapy: Patient Spontanous Breathing and Patient connected to face mask oxygen  Post-op Assessment: Report given to RN, Post -op Vital signs reviewed and stable and Patient moving all extremities  Post vital signs: Reviewed and stable  Last Vitals:  Vitals Value Taken Time  BP 135/77 11/01/19 1117  Temp 36.7 C 11/01/19 1117  Pulse 85 11/01/19 1120  Resp 8 11/01/19 1120  SpO2 100 % 11/01/19 1120  Vitals shown include unvalidated device data.  Last Pain:  Vitals:   11/01/19 1117  PainSc: (P) Asleep         Complications: No complications documented.

## 2019-11-01 NOTE — Op Note (Addendum)
OPERATIVE REPORT  DATE OF SURGERY: 11/01/2019  PATIENT:  Vincent [name],  64 y.o. male  PRE-OPERATIVE DIAGNOSIS:  left facial skin cancer  POST-OPERATIVE DIAGNOSIS:  left facial skin cancer  PROCEDURE:  Procedure(s): 1.  Resection of left facial skin cancer with frozen section analysis. 2.  Local rotation flap closure of defect  SURGEON:  Beckie Salts, MD  ASSISTANTS: Jolene Provost PA  ANESTHESIA:   General   EBL: 10 ml  DRAINS: None  LOCAL MEDICATIONS USED: 1% Xylocaine with epinephrine  SPECIMEN: Left facial skin cancer, double suture marks posterior margin, single suture marks superior margin.  Frozen section analysis, all margins and deep margin negative.  COUNTS:  Correct  PROCEDURE DETAILS: The patient was taken to the operating room and placed on the operating table in the supine position. Following induction of general endotracheal anesthesia, the left face was prepped and draped in standard fashion.  The lesion was identified in the left cheek/preauricular area.  It was approximately 3 cm in diameter.  Marking pen was used to [place] the proposed incisions allowing for about 4-5 mm margin around the lesion.  Local anesthetic was infiltrated.  Electrocautery unit was used to create the skin incisions.  Care was taken to elevate the skin at the level of the parotid fascia.  Towards the anterior aspect of the resection a facial nerve branch was identified and preserved.  The entire lesion was removed and sent for frozen section analysis.  A rotation flap was outlined by extending off the posterior inferior aspect of the resection and curving anteriorly towards the submandibular area.  The flap incisions were outlined with a marking pen and injected with local anesthetic solution.  Electrocautery was used to incise the skin and to elevate and undermined the flaps and the resection site.  The flap was then rotated anterosuperiorly and fit nicely into the defect.  There was  minimal dogear created anteriorly.  Interrupted 3-0 chromic was used to position the flap and to complete the deep layer of closure.  Running 5-0 Rapide day was used for closure.  Dermabond was then used on top of that for additional strength and closure.  Patient was then awakened extubated and transferred to recovery in stable condition.  Surgical assistant on this case was necessary for holding retractors, helping identify bleeders, and helping with visualization and suctioning.  PATIENT DISPOSITION:  To PACU, stable

## 2019-11-01 NOTE — Anesthesia Procedure Notes (Signed)
Procedure Name: Intubation Date/Time: 11/01/2019 10:03 AM Performed by: Myna Bright, CRNA Pre-anesthesia Checklist: Patient identified, Emergency Drugs available, Suction available and Patient being monitored Patient Re-evaluated:Patient Re-evaluated prior to induction Oxygen Delivery Method: Circle system utilized Preoxygenation: Pre-oxygenation with 100% oxygen Induction Type: IV induction Ventilation: Mask ventilation without difficulty Laryngoscope Size: Mac and 4 Grade View: Grade II Tube type: Oral Tube size: 7.5 mm Number of attempts: 1 Airway Equipment and Method: Stylet Placement Confirmation: ETT inserted through vocal cords under direct vision,  positive ETCO2 and breath sounds checked- equal and bilateral Secured at: 22 cm Tube secured with: Tape Dental Injury: Teeth and Oropharynx as per pre-operative assessment

## 2019-11-02 ENCOUNTER — Encounter (HOSPITAL_COMMUNITY): Payer: Self-pay | Admitting: Otolaryngology

## 2019-11-02 LAB — SURGICAL PATHOLOGY

## 2019-11-13 ENCOUNTER — Other Ambulatory Visit: Payer: Self-pay

## 2019-11-13 DIAGNOSIS — Z5321 Procedure and treatment not carried out due to patient leaving prior to being seen by health care provider: Secondary | ICD-10-CM | POA: Insufficient documentation

## 2019-11-13 DIAGNOSIS — Z4800 Encounter for change or removal of nonsurgical wound dressing: Secondary | ICD-10-CM | POA: Insufficient documentation

## 2019-11-14 ENCOUNTER — Emergency Department (HOSPITAL_COMMUNITY)
Admission: EM | Admit: 2019-11-14 | Discharge: 2019-11-14 | Disposition: A | Discharge status: Home / Self Care | Payer: Self-pay | Attending: Emergency Medicine | Admitting: Emergency Medicine

## 2019-11-14 ENCOUNTER — Encounter (HOSPITAL_COMMUNITY): Payer: Self-pay | Admitting: *Deleted

## 2019-11-14 NOTE — ED Notes (Signed)
Pt states that he is leaving and he will reach out to his primary care physician for follow up. Wristband removed and stickers shredded.

## 2019-11-14 NOTE — ED Triage Notes (Signed)
To ED for eval of face incision. Pt states he had skin biopsy from face on 8/9. Around 5pm today he was taking a shower and noticed an area opened. States he called Dr Janeice Robinson office without answer. No bleeding noted. Dressing is clean dry and intact.

## 2020-03-25 DIAGNOSIS — U071 COVID-19: Secondary | ICD-10-CM

## 2020-03-25 HISTORY — DX: COVID-19: U07.1

## 2021-03-13 ENCOUNTER — Encounter (HOSPITAL_COMMUNITY): Payer: Self-pay

## 2021-03-13 ENCOUNTER — Inpatient Hospital Stay (HOSPITAL_COMMUNITY)
Admission: EM | Admit: 2021-03-13 | Discharge: 2021-03-16 | DRG: 167 | Disposition: A | Discharge status: Home / Self Care | Payer: Medicare Other | Attending: Internal Medicine | Admitting: Internal Medicine

## 2021-03-13 ENCOUNTER — Emergency Department (HOSPITAL_COMMUNITY)
Admission: EM | Admit: 2021-03-13 | Discharge: 2021-03-13 | Disposition: A | Discharge status: Home / Self Care | Payer: Medicare Other | Source: Home / Self Care

## 2021-03-13 ENCOUNTER — Emergency Department (HOSPITAL_COMMUNITY): Payer: Medicare Other

## 2021-03-13 ENCOUNTER — Other Ambulatory Visit: Payer: Self-pay

## 2021-03-13 DIAGNOSIS — R0602 Shortness of breath: Secondary | ICD-10-CM

## 2021-03-13 DIAGNOSIS — E871 Hypo-osmolality and hyponatremia: Secondary | ICD-10-CM | POA: Diagnosis present

## 2021-03-13 DIAGNOSIS — Z20822 Contact with and (suspected) exposure to covid-19: Secondary | ICD-10-CM | POA: Insufficient documentation

## 2021-03-13 DIAGNOSIS — Z5321 Procedure and treatment not carried out due to patient leaving prior to being seen by health care provider: Secondary | ICD-10-CM | POA: Insufficient documentation

## 2021-03-13 DIAGNOSIS — M5430 Sciatica, unspecified side: Secondary | ICD-10-CM | POA: Diagnosis present

## 2021-03-13 DIAGNOSIS — Z7984 Long term (current) use of oral hypoglycemic drugs: Secondary | ICD-10-CM | POA: Diagnosis not present

## 2021-03-13 DIAGNOSIS — E1165 Type 2 diabetes mellitus with hyperglycemia: Secondary | ICD-10-CM | POA: Diagnosis present

## 2021-03-13 DIAGNOSIS — Z8616 Personal history of COVID-19: Secondary | ICD-10-CM | POA: Diagnosis not present

## 2021-03-13 DIAGNOSIS — E785 Hyperlipidemia, unspecified: Secondary | ICD-10-CM | POA: Diagnosis present

## 2021-03-13 DIAGNOSIS — J189 Pneumonia, unspecified organism: Secondary | ICD-10-CM

## 2021-03-13 DIAGNOSIS — Z79899 Other long term (current) drug therapy: Secondary | ICD-10-CM

## 2021-03-13 DIAGNOSIS — J851 Abscess of lung with pneumonia: Secondary | ICD-10-CM | POA: Diagnosis present

## 2021-03-13 DIAGNOSIS — R011 Cardiac murmur, unspecified: Secondary | ICD-10-CM | POA: Diagnosis present

## 2021-03-13 DIAGNOSIS — Z85828 Personal history of other malignant neoplasm of skin: Secondary | ICD-10-CM

## 2021-03-13 DIAGNOSIS — Z87891 Personal history of nicotine dependence: Secondary | ICD-10-CM | POA: Diagnosis not present

## 2021-03-13 DIAGNOSIS — R059 Cough, unspecified: Secondary | ICD-10-CM | POA: Insufficient documentation

## 2021-03-13 DIAGNOSIS — N179 Acute kidney failure, unspecified: Secondary | ICD-10-CM | POA: Diagnosis present

## 2021-03-13 DIAGNOSIS — J984 Other disorders of lung: Secondary | ICD-10-CM

## 2021-03-13 DIAGNOSIS — I10 Essential (primary) hypertension: Secondary | ICD-10-CM | POA: Diagnosis present

## 2021-03-13 DIAGNOSIS — Z9889 Other specified postprocedural states: Secondary | ICD-10-CM

## 2021-03-13 DIAGNOSIS — J850 Gangrene and necrosis of lung: Secondary | ICD-10-CM | POA: Diagnosis present

## 2021-03-13 LAB — CBC WITH DIFFERENTIAL/PLATELET
Abs Immature Granulocytes: 0.06 10*3/uL (ref 0.00–0.07)
Basophils Absolute: 0 10*3/uL (ref 0.0–0.1)
Basophils Relative: 0 %
Eosinophils Absolute: 0.1 10*3/uL (ref 0.0–0.5)
Eosinophils Relative: 1 %
HCT: 32.7 % — ABNORMAL LOW (ref 39.0–52.0)
Hemoglobin: 10.5 g/dL — ABNORMAL LOW (ref 13.0–17.0)
Immature Granulocytes: 1 %
Lymphocytes Relative: 13 %
Lymphs Abs: 1.2 10*3/uL (ref 0.7–4.0)
MCH: 27.3 pg (ref 26.0–34.0)
MCHC: 32.1 g/dL (ref 30.0–36.0)
MCV: 85.2 fL (ref 80.0–100.0)
Monocytes Absolute: 0.4 10*3/uL (ref 0.1–1.0)
Monocytes Relative: 4 %
Neutro Abs: 7.2 10*3/uL (ref 1.7–7.7)
Neutrophils Relative %: 81 %
Platelets: 158 10*3/uL (ref 150–400)
RBC: 3.84 MIL/uL — ABNORMAL LOW (ref 4.22–5.81)
RDW: 14 % (ref 11.5–15.5)
WBC: 8.9 10*3/uL (ref 4.0–10.5)
nRBC: 0 % (ref 0.0–0.2)

## 2021-03-13 LAB — COMPREHENSIVE METABOLIC PANEL
ALT: 17 U/L (ref 0–44)
AST: 12 U/L — ABNORMAL LOW (ref 15–41)
Albumin: 2.3 g/dL — ABNORMAL LOW (ref 3.5–5.0)
Alkaline Phosphatase: 178 U/L — ABNORMAL HIGH (ref 38–126)
Anion gap: 10 (ref 5–15)
BUN: 18 mg/dL (ref 8–23)
CO2: 25 mmol/L (ref 22–32)
Calcium: 9.3 mg/dL (ref 8.9–10.3)
Chloride: 96 mmol/L — ABNORMAL LOW (ref 98–111)
Creatinine, Ser: 1.55 mg/dL — ABNORMAL HIGH (ref 0.61–1.24)
GFR, Estimated: 49 mL/min — ABNORMAL LOW (ref >60)
Glucose, Bld: 228 mg/dL — ABNORMAL HIGH (ref 70–99)
Potassium: 3.9 mmol/L (ref 3.5–5.1)
Sodium: 131 mmol/L — ABNORMAL LOW (ref 135–145)
Total Bilirubin: 1.6 mg/dL — ABNORMAL HIGH (ref 0.3–1.2)
Total Protein: 6.9 g/dL (ref 6.5–8.1)

## 2021-03-13 LAB — RESP PANEL BY RT-PCR (FLU A&B, COVID) ARPGX2
Influenza A by PCR: NEGATIVE
Influenza B by PCR: NEGATIVE
SARS Coronavirus 2 by RT PCR: NEGATIVE

## 2021-03-13 LAB — HIV ANTIBODY (ROUTINE TESTING W REFLEX): HIV Screen 4th Generation wRfx: NONREACTIVE

## 2021-03-13 LAB — HEMOGLOBIN A1C
Hgb A1c MFr Bld: 8.6 % — ABNORMAL HIGH (ref 4.8–5.6)
Mean Plasma Glucose: 200.12 mg/dL

## 2021-03-13 LAB — LACTIC ACID, PLASMA
Lactic Acid, Venous: 1.4 mmol/L (ref 0.5–1.9)
Lactic Acid, Venous: 1.4 mmol/L (ref 0.5–1.9)

## 2021-03-13 LAB — CBG MONITORING, ED: Glucose-Capillary: 153 mg/dL — ABNORMAL HIGH (ref 70–99)

## 2021-03-13 LAB — MRSA NEXT GEN BY PCR, NASAL: MRSA by PCR Next Gen: NOT DETECTED

## 2021-03-13 MED ORDER — DICLOFENAC SODIUM 75 MG PO TBEC
75.0000 mg | DELAYED_RELEASE_TABLET | Freq: Two times a day (BID) | ORAL | Status: DC
  Administered 2021-03-13 – 2021-03-16 (×6): 75 mg via ORAL
  Filled 2021-03-13 (×7): qty 1

## 2021-03-13 MED ORDER — BACID PO TABS
2.0000 | ORAL_TABLET | Freq: Three times a day (TID) | ORAL | Status: DC
  Filled 2021-03-13 (×3): qty 2

## 2021-03-13 MED ORDER — INSULIN ASPART 100 UNIT/ML IJ SOLN
0.0000 [IU] | Freq: Three times a day (TID) | INTRAMUSCULAR | Status: DC
  Administered 2021-03-14: 11 [IU] via SUBCUTANEOUS
  Administered 2021-03-14 (×2): 3 [IU] via SUBCUTANEOUS

## 2021-03-13 MED ORDER — ONDANSETRON HCL 4 MG PO TABS: 4.0000 mg | ORAL_TABLET | Freq: Four times a day (QID) | ORAL | Status: DC | PRN

## 2021-03-13 MED ORDER — SODIUM CHLORIDE 0.9 % IV SOLN: 500.0000 mg | INTRAVENOUS | Status: DC

## 2021-03-13 MED ORDER — PIPERACILLIN-TAZOBACTAM 3.375 G IVPB 30 MIN
3.3750 g | Freq: Once | INTRAVENOUS | Status: AC
  Administered 2021-03-13: 17:00:00 3.375 g via INTRAVENOUS
  Filled 2021-03-13: qty 50

## 2021-03-13 MED ORDER — IPRATROPIUM-ALBUTEROL 0.5-2.5 (3) MG/3ML IN SOLN
3.0000 mL | Freq: Four times a day (QID) | RESPIRATORY_TRACT | Status: DC
  Administered 2021-03-13: 20:00:00 3 mL via RESPIRATORY_TRACT
  Filled 2021-03-13: qty 3

## 2021-03-13 MED ORDER — ACETAMINOPHEN 650 MG RE SUPP: 650.0000 mg | Freq: Four times a day (QID) | RECTAL | Status: DC | PRN

## 2021-03-13 MED ORDER — ENOXAPARIN SODIUM 40 MG/0.4ML IJ SOSY
40.0000 mg | PREFILLED_SYRINGE | INTRAMUSCULAR | Status: DC
  Administered 2021-03-13 – 2021-03-15 (×3): 40 mg via SUBCUTANEOUS
  Filled 2021-03-13 (×3): qty 0.4

## 2021-03-13 MED ORDER — ACETAMINOPHEN 325 MG PO TABS: 650.0000 mg | ORAL_TABLET | Freq: Four times a day (QID) | ORAL | Status: DC | PRN

## 2021-03-13 MED ORDER — METFORMIN HCL 500 MG PO TABS: 500.0000 mg | ORAL_TABLET | Freq: Two times a day (BID) | ORAL | Status: DC

## 2021-03-13 MED ORDER — BENZONATATE 100 MG PO CAPS
200.0000 mg | ORAL_CAPSULE | Freq: Two times a day (BID) | ORAL | Status: DC
  Administered 2021-03-13 – 2021-03-15 (×5): 200 mg via ORAL
  Filled 2021-03-13 (×6): qty 2

## 2021-03-13 MED ORDER — SODIUM CHLORIDE 0.9 % IV SOLN: INTRAVENOUS | Status: AC

## 2021-03-13 MED ORDER — PIPERACILLIN-TAZOBACTAM 3.375 G IVPB
3.3750 g | Freq: Three times a day (TID) | INTRAVENOUS | Status: DC
  Administered 2021-03-13 – 2021-03-16 (×6): 3.375 g via INTRAVENOUS
  Filled 2021-03-13 (×8): qty 50

## 2021-03-13 MED ORDER — CYANOCOBALAMIN 500 MCG PO TBDP
ORAL_TABLET | Freq: Every day | ORAL | Status: DC
Start: 2021-03-13 — End: 2021-03-13

## 2021-03-13 MED ORDER — DICLOFENAC SODIUM 1 % EX GEL: 1.0000 "application " | Freq: Every day | CUTANEOUS | Status: DC | PRN

## 2021-03-13 MED ORDER — IOHEXOL 300 MG/ML  SOLN
75.0000 mL | Freq: Once | INTRAMUSCULAR | Status: AC | PRN
  Administered 2021-03-13: 15:00:00 75 mL via INTRAVENOUS

## 2021-03-13 MED ORDER — HYDROCODONE-ACETAMINOPHEN 5-325 MG PO TABS: 1.5000 | ORAL_TABLET | Freq: Four times a day (QID) | ORAL | Status: DC | PRN

## 2021-03-13 MED ORDER — PROMETHAZINE HCL 25 MG RE SUPP
25.0000 mg | Freq: Four times a day (QID) | RECTAL | Status: DC | PRN
  Filled 2021-03-13: qty 1

## 2021-03-13 MED ORDER — SODIUM CHLORIDE 0.9 % IV SOLN: 2.0000 g | Freq: Once | INTRAVENOUS | Status: DC

## 2021-03-13 MED ORDER — VITAMIN B-12 100 MCG PO TABS
500.0000 ug | ORAL_TABLET | Freq: Every day | ORAL | Status: DC
Start: 2021-03-13 — End: 2021-03-16
  Administered 2021-03-13 – 2021-03-15 (×3): 500 ug via ORAL
  Filled 2021-03-13 (×2): qty 5
  Filled 2021-03-13: qty 1
  Filled 2021-03-13: qty 5

## 2021-03-13 MED ORDER — ONDANSETRON HCL 4 MG/2ML IJ SOLN: 4.0000 mg | Freq: Four times a day (QID) | INTRAMUSCULAR | Status: DC | PRN

## 2021-03-13 MED ORDER — SIMVASTATIN 20 MG PO TABS
20.0000 mg | ORAL_TABLET | Freq: Every evening | ORAL | Status: DC
  Administered 2021-03-13 – 2021-03-15 (×3): 20 mg via ORAL
  Filled 2021-03-13 (×3): qty 1

## 2021-03-13 MED ORDER — OXYCODONE HCL 5 MG PO TABS
10.0000 mg | ORAL_TABLET | ORAL | Status: DC | PRN
  Administered 2021-03-15: 10 mg via ORAL
  Filled 2021-03-13: qty 2

## 2021-03-13 NOTE — ED Provider Notes (Addendum)
Emergency Medicine Provider Triage Evaluation Note  Vincent [name] , a 65 y.o. male  was evaluated in triage.  Pt complains of cough.  States recently diagnosed with PNA 2 weeks ago, started on abx 5 days ago but not tolerating (nausea/vomiting).  States some intermittent SOB, denies this currently.  Review of Systems  Positive: cough Negative: Fever, chills  Physical Exam  BP 113/82 (BP Location: Right Arm)    Pulse 66    Temp 99.7 F (37.6 C) (Oral)    Resp 16    Ht 5' 10.5" (1.791 m)    Wt 74.8 kg    SpO2 98%    BMI 23.34 kg/m   Gen:   Awake, no distress Resp:  Normal effort  MSK:   Moves extremities without difficulty  Other:    Medical Decision Making  Medically screening exam initiated at 1:04 AM.  Appropriate orders placed.  Vincent [name] was informed that the remainder of the evaluation will be completed by another provider, this initial triage assessment does not replace that evaluation, and the importance of remaining in the ED until their evaluation is complete.  Febrile to 99.33F in triage.  Will check labs, lactate, cultures, CXR, covid screen.  2:20 AM CXR with findings of cavitary lesion with air fluid levels.  CT chest recommended for further evaluation.  This has been ordered.  6:20 AM Informed that patient was not in Vincent [name].  Called on home # listed in chart to inform of abnormal CXR results-- states he will eat some breakfast and come back to have CT done.  Advised he will need to stay for full evaluation.  He is agreeable.   Vincent Pickett, PA-C 03/13/21 0121    Vincent Pickett, PA-C 03/13/21 0221    [name], MD 03/13/21 0530    Vincent Pickett, PA-C 03/13/21 [name]    [name], MD 03/13/21 [phone]

## 2021-03-13 NOTE — ED Notes (Signed)
Refused vitals 

## 2021-03-13 NOTE — Consult Note (Signed)
NAME:  Vincent [name], MRN:  [id number], DOB:  [birth year], LOS: 1 ADMISSION DATE:  03/13/2021, CONSULTATION DATE:  12/21 REFERRING MD:  [place] CHIEF COMPLAINT:  Cavitary PNA    History of Present Illness:  65 year old male who presented to the ED 12/20 w/ cc: shortness of breath, and productive cough w/ hemoptysis. Per patient had COVID Oct 2022 and hasn't felt the same since.  Starting around end of Oct or beginning of Nov, he started to have cough which progressed and started coughing up phlegm.  This continued and roughly 2 weeks ago, he was seen by PCP and told that he had pneumonia (initially treated with Z-Pak and steroids with minimal improvement so then changed to different abx which he could not recall; however, could not tolerate 2nd round d/t Nausea).   In ED, CXR w/ RLL cavitary changes. Because of this CT chest obtained confirming dense RLL cavitary PNA. PCCM asked to eval.  He has had ongoing cough, initially green colored sputum then later started to turn pink colored.  No fevers/chills/sweats, myalgias, exposures to known sick contacts. 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 One of them is 50 years old and lives in the house and sleeps in the bedroom with him and his wife.  Pertinent  Medical History  Type II Diabetes, COVID infection October 2022, facial basal cell carcinoma, chronic heart murmur   Significant Hospital Events: Including procedures, antibiotic start and stop dates in addition to other pertinent events   12/20 admitted w/ cavitary PNA. Placed on Zosyn   Interim History / Subjective:  Comfortable on room air.  Has ongoing cough.  States his symptoms are worse in the middle of the night when he gets into coughing spells with wheezing.  During the day, things seem to settle down.  Objective   Blood pressure 119/66, pulse 89, temperature (!) 97.5 F (36.4 C), temperature source Oral, resp. rate 18, height _0  (1.778 m), weight 70.7 kg, SpO2  96 %.        Intake/Output Summary (Last 24 hours) at 03/14/2021 1024 Last data filed at 03/14/2021 0354 Gross per 24 hour  Intake 704.01 ml  Output --  Net 704.01 ml   Filed Weights   03/14/21 0224  Weight: 70.7 kg    Examination: General: Adult male, resting in bed, in NAD. Neuro: A&O x 3, no deficits. HEENT: [place]/AT. Sclerae anicteric. EOMI. Cardiovascular: RRR, no M/R/G.  Lungs: Respirations even and unlabored.  CTA bilaterally, No W/R/R. Abdomen: BS x 4, soft, NT/ND.  Musculoskeletal: No gross deformities, no edema.  Skin: Intact, warm, no rashes.  Assessment & Plan:   RLL Cavitary PNA - Likely lingering PNA that is yet to resolve or possibly HSP with his duck/feather exposure.  Can't rule out atypical bacterial infection given his duck exposure though less likely (no particular illness with ducks specifically such as Psittacosis with birds as the reservoir). - Continue with empiric Zosyn. - Follow blood and sputum cultures. - Send HSP panel now. - Will plan for bronchoscopy with BAL 12/22 (Dr. Ander Slade).  NPO after midnight orders placed. - Hold steroids until after bronchoscopy as this will affect some of the labs. - Continue Mucinex and bronchial hygiene.  Rest per primary team.  Best Practice (right click and "Reselect all SmartList Selections" daily)  Per primary team.  Labs   CBC: Recent Labs  Lab 03/13/21 0135 03/14/21 0453  WBC 8.9 7.2  NEUTROABS  7.2  --   HGB 10.5* 9.4*  HCT 32.7* 28.3*  MCV 85.2 84.0  PLT 158 137*    Basic Metabolic Panel: Recent Labs  Lab 03/13/21 0135 03/14/21 0453  NA 131* 132*  K 3.9 4.6  CL 96* 97*  CO2 25 28  GLUCOSE 228* 164*  BUN 18 18  CREATININE 1.55* 1.74*  CALCIUM 9.3 8.8*   GFR: Estimated Creatinine Clearance: 42.3 mL/min (A) (by C-G formula based on SCr of 1.74 mg/dL (H)). Recent Labs  Lab 03/13/21 0135 03/13/21 1637 03/14/21 0453  WBC 8.9  --  7.2  LATICACIDVEN 1.4 1.4  --     Liver Function  Tests: Recent Labs  Lab 03/13/21 0135  AST 12*  ALT 17  ALKPHOS 178*  BILITOT 1.6*  PROT 6.9  ALBUMIN 2.3*   No results for input(s): LIPASE, AMYLASE in the last 168 hours. No results for input(s): AMMONIA in the last 168 hours.  ABG No results found for: PHART, PCO2ART, PO2ART, HCO3, TCO2, ACIDBASEDEF, O2SAT   Coagulation Profile: No results for input(s): INR, PROTIME in the last 168 hours.  Cardiac Enzymes: No results for input(s): CKTOTAL, CKMB, CKMBINDEX, TROPONINI in the last 168 hours.  HbA1C: Hgb A1c MFr Bld  Date/Time Value Ref Range Status  03/13/2021 07:32 PM 8.6 (H) 4.8 - 5.6 % Final    Comment:    (NOTE) Pre diabetes:          5.7%-6.4%  Diabetes:              >6.4%  Glycemic control for   <7.0% adults with diabetes   10/26/2019 03:48 PM 7.2 (H) 4.8 - 5.6 % Final    Comment:    (NOTE) Pre diabetes:          5.7%-6.4%  Diabetes:              >6.4%  Glycemic control for   <7.0% adults with diabetes     CBG: Recent Labs  Lab 03/13/21 2355 03/14/21 0654  GLUCAP 153* 155*    Review of Systems:   All negative; except for those that are bolded, which indicate positives.  Constitutional: weight loss, weight gain, night sweats, fevers, chills, fatigue, weakness.  HEENT: headaches, sore throat, sneezing, nasal congestion, post nasal drip, difficulty swallowing, tooth/dental problems, visual complaints, visual changes, ear aches. Neuro: difficulty with speech, weakness, numbness, ataxia. CV:  chest pain, orthopnea, PND, swelling in lower extremities, dizziness, palpitations, syncope.  Resp: cough, hemoptysis, dyspnea, wheezing. GI: heartburn, indigestion, abdominal pain, nausea, vomiting, diarrhea, constipation, change in bowel habits, loss of appetite, hematemesis, melena, hematochezia.  GU: dysuria, change in color of urine, urgency or frequency, flank pain, hematuria. MSK: joint pain or swelling, decreased range of motion. Psych: change in mood  or affect, depression, anxiety, suicidal ideations, homicidal ideations. Skin: rash, itching, bruising.   Past Medical History:  He,  has a past medical history of Arthritis, Complication of anesthesia, Diabetes mellitus without complication ([place]), Facial basal cell cancer, Heart murmur, and Nerve pain.   Surgical History:   Past Surgical History:  Procedure Laterality Date   BACK SURGERY     x2   HERNIA REPAIR     INCISION AND DRAINAGE OF WOUND Left 10/27/2016   Procedure: IRRIGATION AND DEBRIDEMENT LEFT PATELLA;  Surgeon: Meredith Pel, MD;  Location: [facility];  Service: Orthopedics;  Laterality: Left;   LESION REMOVAL Left 11/01/2019   Procedure: LEFT FACIAL LESION REMOVAL;  Surgeon: Izora Gala, MD;  Location: [facility];  Service: ENT;  Laterality: Left;   SPINE SURGERY     x2     Social History:   reports that he quit smoking about 32 years ago. He has never used smokeless tobacco. He reports that he does not drink alcohol and does not use drugs.   Family History:  His family history is not on file.   Allergies No Known Allergies   Home Medications  Prior to Admission medications   Medication Sig Start Date End Date Taking? Authorizing Provider  blood glucose meter kit and supplies Dispense based on patient and insurance preference. Use up to four times daily as directed. (FOR ICD-10 E11.8). 10/29/16   Johnson, Clanford L, MD  cephALEXin (KEFLEX) 500 MG capsule Take 1 capsule (500 mg total) by mouth 3 (three) times daily. 11/01/19   Izora Gala, MD  Cyanocobalamin (B-12 DOTS PO) Take 1 tablet by mouth daily.    [provider]  diclofenac (VOLTAREN) 75 MG EC tablet Take 75 mg by mouth 2 (two) times daily.  02/18/16   [provider]  diclofenac Sodium (VOLTAREN) 1 % GEL Apply 1 application topically daily as needed (back pain).    [provider]  HYDROcodone-acetaminophen (NORCO) 7.5-325 MG tablet Take 1 tablet by mouth every 6 (six) hours as needed  for moderate pain. 11/01/19   Izora Gala, MD  metFORMIN (GLUCOPHAGE) 1000 MG tablet Take 500 mg by mouth 2 (two) times daily with a meal.    [provider]  Oxycodone HCl 10 MG TABS Take 10 mg by mouth every 4 (four) hours as needed (pain).     [provider]  promethazine (PHENERGAN) 25 MG suppository Place 1 suppository (25 mg total) rectally every 6 (six) hours as needed for nausea or vomiting. 11/01/19   Izora Gala, MD  simvastatin (ZOCOR) 20 MG tablet Take 20 mg by mouth every evening.    [provider]  VITAMIN D PO Take 1 tablet by mouth daily.    [provider]     Montey Hora, [place] For pager details, please see AMION or use Epic chat  After 1900, please call [facility] for cross coverage needs 03/14/2021, 10:24 AM

## 2021-03-13 NOTE — ED Provider Notes (Signed)
[facility] EMERGENCY DEPARTMENT Provider Note   CSN: [id number] Arrival date & time: 03/13/21  1407     History Chief Complaint  Patient presents with   Shortness of Vincent [name] is a 65 y.o. male with PMH T2DM, previous COVID-19 infection in October 2022 who presents the emergency department for evaluation of cough, hemoptysis and dyspnea on exertion.  Patient states that his symptoms been gradually worsening over the last 3 months and he currently is unable to make it to the bathroom without needing to stop for breath.  He denies abdominal pain, nausea, vomiting, diarrhea or other systemic symptoms.  Patient presented to the emergency department last night but left without being seen due to long wait times.  Chest x-ray from last night with concerning cavitary pneumonia.  Patient returns his weight times have decreased.  He arrives tachycardic to 127, tachypnea in the mid 20s.  Of note, patient states that he sleeps with a [name] at the edge of the bed, the duck is 65 years old.   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    Nerve pain    per patient, both legs    Patient Active Problem List   Diagnosis Date Noted   Diabetes mellitus with complication ([place]) 32/20/2542   Lactic acidosis 10/28/2016   Hypertension 10/28/2016   Septic prepatellar bursitis 10/27/2016   Narcotic drug use 12/07/2013   Chronic low back pain  10/12/2010    Past Surgical History:  Procedure Laterality Date   BACK SURGERY     x2   HERNIA REPAIR     INCISION AND DRAINAGE OF WOUND Left 10/27/2016   Procedure: IRRIGATION AND DEBRIDEMENT LEFT PATELLA;  Surgeon: Meredith Pel, MD;  Location: [place];  Service: Orthopedics;  Laterality: Left;   LESION REMOVAL Left 11/01/2019   Procedure: LEFT FACIAL LESION REMOVAL;  Surgeon: Izora Gala, MD;  Location: [place];  Service: ENT;  Laterality: Left;   SPINE SURGERY     x2       No family history on file.  Social History   Tobacco Use   Smoking status: Former    Types: Cigarettes    Quit date: 03/25/1988    Years since quitting: 32.9   Smokeless tobacco: Never  Vaping Use   Vaping Use: Never used  Substance Use Topics   Alcohol use: No   Drug use: No    Home Medications Prior to Admission medications   Medication Sig Start Date End Date Taking? Authorizing Provider  blood glucose meter kit and supplies Dispense based on patient and insurance preference. Use up to four times daily as directed. (FOR ICD-10 E11.8). 10/29/16   Johnson, Clanford L, MD  cephALEXin (KEFLEX) 500 MG capsule Take 1 capsule (500 mg total) by mouth 3 (three) times daily. 11/01/19   Izora Gala, MD  Cyanocobalamin (B-12 DOTS PO) Take 1 tablet by mouth daily.    [provider]  diclofenac (VOLTAREN) 75 MG EC tablet Take 75 mg by mouth 2 (two) times daily.  02/18/16   [provider]  diclofenac Sodium (VOLTAREN) 1 % GEL Apply 1 application topically daily as needed (back pain).    [provider]  HYDROcodone-acetaminophen (NORCO) 7.5-325 MG tablet Take 1 tablet by mouth every 6 (six) hours as needed for moderate pain. 11/01/19   Izora Gala, MD  metFORMIN (GLUCOPHAGE) 1000 MG tablet Take 500 mg by mouth 2 (two) times daily with a meal.    [provider]  Oxycodone HCl 10 MG TABS Take 10 mg by mouth every 4 (four) hours as needed (pain).     [provider]   promethazine (PHENERGAN) 25 MG suppository Place 1 suppository (25 mg total) rectally every 6 (six) hours as needed for nausea or vomiting. 11/01/19   Izora Gala, MD  simvastatin (ZOCOR) 20 MG tablet Take 20 mg by mouth every evening.    [provider]  VITAMIN D PO Take 1 tablet by mouth daily.    [provider]    Allergies    Patient has no known allergies.  Review of Systems   Review of Systems  Constitutional:  Negative for chills and fever.  HENT:  Negative for ear pain and sore throat.   Eyes:  Negative for pain and visual disturbance.  Respiratory:  Positive for cough and shortness of breath.        Hemoptysis  Cardiovascular:  Negative for chest pain and palpitations.  Gastrointestinal:  Negative for abdominal pain and vomiting.  Genitourinary:  Negative for dysuria and hematuria.  Musculoskeletal:  Negative for arthralgias and back pain.  Skin:  Negative for color change and rash.  Neurological:  Negative for seizures and syncope.  All other systems reviewed and are negative.  Physical Exam Updated Vital Signs BP (!) 136/98 (BP Location: Left Arm)    Pulse (!) 127    Resp (!) 22    SpO2 95%   Physical Exam Vitals and nursing note reviewed.  Constitutional:      General: He is not in acute distress.    Appearance: He is well-developed.  HENT:     Head: Normocephalic and atraumatic.  Eyes:     Conjunctiva/sclera: Conjunctivae normal.  Cardiovascular:     Rate and Rhythm: Normal rate and regular rhythm.     Heart sounds: No murmur heard. Pulmonary:     Effort: Pulmonary effort is normal. No respiratory distress.     Breath sounds: Examination of the right-lower field reveals rhonchi. Rhonchi present.  Abdominal:     Palpations: Abdomen is soft.     Tenderness: There is no abdominal tenderness.  Musculoskeletal:        General: No swelling.     Cervical back: Neck supple.  Skin:    General: Skin is warm and dry.     Capillary Refill:  Capillary refill takes less than 2 seconds.  Neurological:     Mental Status: He is alert.  Psychiatric:        Mood and Affect: Mood normal.    ED Results / Procedures / Treatments   Labs (all labs ordered are listed, but only abnormal results are displayed) Labs Reviewed - No data to display  EKG None  Radiology DG Chest 2 View  Result Date: 03/13/2021 CLINICAL DATA:  Cough, recent pneumonia. EXAM: CHEST -  2 VIEW COMPARISON:  02/11/2006. FINDINGS: The heart size and mediastinal contours are within normal limits. There is consolidation in the right lower lobe with a region of air-fluid levels, suspected for cavitary lesion. The possibility of a small right pleural effusion can not be excluded. The left lung is clear. No pneumothorax is seen. No acute osseous abnormality. IMPRESSION: Consolidation with findings suggestive cavitary lesion with air-fluid levels in the right lower lobe. While findings may be infectious or inflammatory, the possibility of underlying neoplastic process can not be excluded. CT is suggested for further evaluation. Electronically Signed   By: Brett Fairy M.D.   On: 03/13/2021 02:01   CT Chest W Contrast  Result Date: 03/13/2021 CLINICAL DATA:  Abnormal chest x-ray; recent pneumonia EXAM: CT CHEST WITH CONTRAST TECHNIQUE: Multidetector CT imaging of the chest was performed during intravenous contrast administration. CONTRAST:  17m OMNIPAQUE IOHEXOL 300 MG/ML  SOLN COMPARISON:  None. FINDINGS: Cardiovascular: Normal heart size. No pericardial effusion. Thoracic aorta is normal in caliber. Coronary artery calcification. Mediastinum/Nodes: Mildly enlarged right hilar node. No mediastinal adenopathy. Lungs/Pleura: No pleural effusion or pneumothorax. Right lower lobe consolidation abutting the major fissure. There are 2 adjacent and possibly communicating areas of cavitation with air-fluid levels. Additional patchy ground-glass density and nodular opacities in both  lungs. Upper Abdomen: No acute abnormality. Musculoskeletal: No acute osseous abnormality. IMPRESSION: Right lower lobe consolidation with adjacent and possibly communicating areas of cavitation likely representing pneumonia with abscess formation. Additional patchy ground-glass density and nodular opacities in both lungs reflect multifocal pneumonia. Electronically Signed   By: PMacy MisM.D.   On: 03/13/2021 15:15    Procedures Procedures   Medications Ordered in ED Medications  iohexol (OMNIPAQUE) 300 MG/ML solution 75 mL (75 mLs Intravenous Contrast Given 03/13/21 1453)    ED Course  I have reviewed the triage vital signs and the nursing notes.  Pertinent labs & imaging results that were available during my care of the patient were reviewed by me and considered in my medical decision making (see chart for details).    MDM Rules/Calculators/A&P                          Patient seen the emergency department for evaluation of cough and hemoptysis as well as shortness of breath.  Physical exam reveals rales in the right lower lobe.  Laboratory evaluation with no significant leukocytosis, creatinine elevated to 1.55 which is an elevation for this patient, hypoalbuminemia, mild hyponatremia 131, lactate last night normal at 1.4.  CT chest with contrast showing cavitary pneumonia.  After speaking with pharmacy, patient placed on Zosyn and will add Vanco if MRSA swab positive.  Patient COVID and flu negative.  Patient will require admission for cavitary pneumonia.   Final Clinical Impression(s) / ED Diagnoses Final diagnoses:  None    Rx / DC Orders ED Discharge Orders     None        Bao Coreas, MDebe Coder MD 03/13/21 1623

## 2021-03-13 NOTE — ED Provider Notes (Signed)
Emergency Medicine Provider Triage Evaluation Note  Vincent [name] , a 65 y.o. male  was evaluated in triage.  Pt complains of pneumonia, abnormal chest x-ray.  Patient was seen for worsening shortness of breath and cough early this morning.  He had an abnormal x-ray.  He left without being seen.  Patient has been having symptoms since October.  He took a partial course of COVID medication and has recently been on antibiotics.  Reports wheezing at night.  Has noted green sputum and some hemoptysis at times.  Review of Systems  Positive: Cough, shortness of breath, wheezing Negative: Leg swelling  Physical Exam  BP (!) 136/98 (BP Location: Left Arm)    Pulse (!) 127    Resp (!) 22    SpO2 95%  Gen:   Awake, no distress   Resp:  Normal effort, lungs clear MSK:   Moves extremities without difficulty  Other:  Tachycardic  Medical Decision Making  Medically screening exam initiated at 2:14 PM.  Appropriate orders placed.  [name] was informed that the remainder of the evaluation will be completed by another provider, this initial triage assessment does not replace that evaluation, and the importance of remaining in the ED until their evaluation is complete.  Reviewed labs from last night.  COVID testing performed.  Will hold off on additional labs at this time.  Patient is willing to be admitted if needed.   Carlisle Cater, PA-C 03/13/21 1416    Carmin Muskrat, MD 03/13/21 [phone]

## 2021-03-13 NOTE — ED Triage Notes (Signed)
Pt reports worsening sob since October, has been taking abx for pneumonia but symptoms have not gone away. Pt was seen here last night but LWBS after triage. Pt tachycardic 125 in triage. Resp e.u at this time.

## 2021-03-13 NOTE — ED Notes (Signed)
Patient states he is leaving and will call for his results

## 2021-03-13 NOTE — ED Triage Notes (Signed)
Pt brought to ED triage via wheelchair by [facility] with c/o cough. States he was recently diagnosed with pnemonia and given oral antibiotics but was unable to tolerate d/t gastric side effects. Pt appears stable at time of triage

## 2021-03-13 NOTE — Progress Notes (Signed)
Pharmacy Antibiotic Note  Vincent [name] is a 65 y.o. male admitted on 03/13/2021 with pneumonia.  Pharmacy has been consulted for Zosyn dosing.  WBC wnl, LA wnl, SCr mildly elevated   Plan: Zosyn 3.375g IV q8h (4 hour infusion). Monitor CBC, renal fx, cultures, and clinical progress      Temp (24hrs), Avg:99.7 F (37.6 C), Min:99.7 F (37.6 C), Max:99.7 F (37.6 C)  Recent Labs  Lab 03/13/21 0135  WBC 8.9  CREATININE 1.55*  LATICACIDVEN 1.4    Estimated Creatinine Clearance: 49.9 mL/min (A) (by C-G formula based on SCr of 1.55 mg/dL (H)).    No Known Allergies  Antimicrobials this admission: Zosyn 12/20 >>  Ceftriaxone 12/20 x 1 Azithromycin 12/20 x 1   Dose adjustments this admission:   Microbiology results: 12/20 BCx:    Thank you for allowing pharmacy to be a part of this patients care.  Albertina Parr, PharmD., BCPS, BCCCP Clinical Pharmacist Please refer to [facility] for unit-specific pharmacist

## 2021-03-13 NOTE — H&P (Signed)
History and Physical    Vincent [name] HQI:[id number] DOB: [birth year] DOA: 03/13/2021  PCP: Leonard Downing, MD (Confirm with patient/family/NH records and if not entered, this has to be entered at [facility] point of entry) Patient coming from: Home  I have personally briefly reviewed patient's old medical records in [place]  Chief Complaint: Feeling sick.  HPI: Vincent [name] is a 65 y.o. male with medical history significant of IIDM, HTN, sciatica on narcotics, presented with persistent productive cough, generalized weakness and nausea vomiting.  Patient had COVID October of this year, recovered, but since then he has had persistent generalized weakness malaise and shortness of breath.  About 2 weeks ago, he went to see his PCP and was diagnosed with pneumonia and treated with 10 days course of Z-Pak and steroid.  He did not feel any better after treatment and went back last week and was started on another antibiotic which he does not recall the name, he took the antibiotics for 3 days and started to develop repeated nauseous vomiting and he stopped taking the antibiotics altogether 3 days ago.  He said he has been coughing up plenty of greenish sputum last week but since Friday the color of the sputum has been turning pinkish, still large amount.  Also reported shortness of breath exertional with short distance around health.  Denied any chest pain, no abdominal pain no diarrhea.  No fever or chills.  At baseline, patient has diabetes taking metformin.  He lives with his wife and receives [employer] at home including one duck staying in his bedroom for "years".  He denied ever getting sick from the duck.  ED Course: Borderline tachycardia, no hypotension, no hypoxia.  CT chest showed cavitary pneumonia right lower lobe.  Zosyn was started in the ED.  Review of Systems: As per HPI otherwise 14 point review of systems negative.    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 He has never used smokeless tobacco. He reports that he does not drink alcohol and does not use drugs.  No Known Allergies  No family history on file.   Prior to Admission medications   Medication Sig Start Date End Date Taking? Authorizing Provider  blood glucose meter kit and supplies Dispense based on patient and insurance preference. Use up to four times daily as directed. (FOR ICD-10 E11.8). 10/29/16   Wynetta Emery,  Clanford L, MD  cephALEXin (KEFLEX) 500 MG capsule Take 1 capsule (500 mg total) by mouth 3 (three) times daily. 11/01/19   Izora Gala, MD  Cyanocobalamin (B-12 DOTS PO) Take 1 tablet by mouth daily.    [provider]  diclofenac (VOLTAREN) 75 MG EC tablet Take 75 mg by mouth 2 (two) times daily.  02/18/16   [provider]  diclofenac Sodium (VOLTAREN) 1 % GEL Apply 1 application topically daily as needed (back pain).    [provider]  HYDROcodone-acetaminophen (NORCO) 7.5-325 MG tablet Take 1 tablet by mouth every 6 (six) hours as needed for  moderate pain. 11/01/19   Izora Gala, MD  metFORMIN (GLUCOPHAGE) 1000 MG tablet Take 500 mg by mouth 2 (two) times daily with a meal.    [provider]  Oxycodone HCl 10 MG TABS Take 10 mg by mouth every 4 (four) hours as needed (pain).     [provider]  promethazine (PHENERGAN) 25 MG suppository Place 1 suppository (25 mg total) rectally every 6 (six) hours as needed for nausea or vomiting. 11/01/19   Izora Gala, MD  simvastatin (ZOCOR) 20 MG tablet Take 20 mg by mouth every evening.    [provider]  VITAMIN D PO Take 1 tablet by mouth daily.    [provider]    Physical Exam: Vitals:   03/13/21 1700 03/13/21 1745 03/13/21 1800 03/13/21 1815  BP: 112/60 (!) 95/49 104/62 (!) 120/97  Pulse: (!) 55 (!) 53 (!) 106 (!) 107  Resp: 15 20 16 14   Temp:      TempSrc:      SpO2: 96% 94% 98% 96%    Constitutional: NAD, calm, comfortable Vitals:   03/13/21 1700 03/13/21 1745 03/13/21 1800 03/13/21 1815  BP: 112/60 (!) 95/49 104/62 (!) 120/97  Pulse: (!) 55 (!) 53 (!) 106 (!) 107  Resp: 15 20 16 14   Temp:      TempSrc:      SpO2: 96% 94% 98% 96%   Eyes: PERRL, lids and conjunctivae normal ENMT: Mucous membranes are dry. Posterior pharynx clear of any exudate or lesions.Normal dentition.  Neck: normal, supple, no masses, no thyromegaly Respiratory: clear to auscultation bilaterally, no wheezing, crackles on bilateral fields.  Increasing respiratory effort. No accessory muscle use.  Cardiovascular: Regular rate and rhythm, no murmurs / rubs / gallops. No extremity edema. 2+ pedal pulses. No carotid bruits.  Abdomen: no tenderness, no masses palpated. No hepatosplenomegaly. Bowel sounds positive.  Musculoskeletal: no clubbing / cyanosis. No joint deformity upper and lower extremities. Good ROM, no contractures. Normal muscle tone.  Skin: no rashes, lesions, ulcers. No induration Neurologic: CN 2-12 grossly intact. Sensation intact, DTR normal.  Strength 5/5 in all 4.  Psychiatric: Normal judgment and insight. Alert and oriented x 3. Normal mood.    Labs on Admission: I have personally reviewed following labs and imaging studies  CBC: Recent Labs  Lab 03/13/21 0135  WBC 8.9  NEUTROABS 7.2  HGB 10.5*  HCT 32.7*  MCV 85.2  PLT 546   Basic Metabolic Panel: Recent Labs  Lab 03/13/21 0135  NA 131*  K 3.9  CL 96*  CO2 25  GLUCOSE 228*  BUN 18  CREATININE 1.55*  CALCIUM 9.3   GFR: Estimated Creatinine Clearance: 49.9 mL/min (A) (by C-G formula based on SCr of 1.55 mg/dL (H)). Liver Function Tests: Recent Labs  Lab 03/13/21 0135  AST 12*  ALT 17  ALKPHOS 178*  BILITOT  1.6*  PROT 6.9  ALBUMIN 2.3*   No results for input(s): LIPASE, AMYLASE in the last 168 hours. No results for input(s): AMMONIA in the last 168 hours. Coagulation Profile: No results for input(s): INR, PROTIME in the last 168 hours. Cardiac Enzymes: No results for input(s): CKTOTAL, CKMB, CKMBINDEX, TROPONINI in the last 168 hours. BNP (last 3 results) No results for input(s): PROBNP in the last 8760 hours. HbA1C: No results for input(s): HGBA1C in the last 72 hours. CBG: No results for input(s): GLUCAP in the last 168 hours. Lipid Profile: No results for input(s): CHOL, HDL, LDLCALC, TRIG, CHOLHDL, LDLDIRECT in the last 72 hours. Thyroid Function Tests: No results for input(s): TSH, T4TOTAL, FREET4, T3FREE, THYROIDAB in the last 72 hours. Anemia Panel: No results for input(s): VITAMINB12, FOLATE, FERRITIN, TIBC, IRON, RETICCTPCT in the last 72 hours. Urine analysis:    Component Value Date/Time   COLORURINE YELLOW 10/27/2016 2040   APPEARANCEUR CLEAR 10/27/2016 2040   LABSPEC 1.010 10/27/2016 2040   PHURINE 5.5 10/27/2016 2040   GLUCOSEU 500 (A) 10/27/2016 2040   HGBUR NEGATIVE 10/27/2016 2040   BILIRUBINUR NEGATIVE 10/27/2016 2040   [place] NEGATIVE 10/27/2016 2040   PROTEINUR NEGATIVE 10/27/2016 2040   NITRITE NEGATIVE  10/27/2016 2040   LEUKOCYTESUR NEGATIVE 10/27/2016 2040    Radiological Exams on Admission: DG Chest 2 View  Result Date: 03/13/2021 CLINICAL DATA:  Cough, recent pneumonia. EXAM: CHEST - 2 VIEW COMPARISON:  02/11/2006. FINDINGS: The heart size and mediastinal contours are within normal limits. There is consolidation in the right lower lobe with a region of air-fluid levels, suspected for cavitary lesion. The possibility of a small right pleural effusion can not be excluded. The left lung is clear. No pneumothorax is seen. No acute osseous abnormality. IMPRESSION: Consolidation with findings suggestive cavitary lesion with air-fluid levels in the right lower lobe. While findings may be infectious or inflammatory, the possibility of underlying neoplastic process can not be excluded. CT is suggested for further evaluation. Electronically Signed   By: Brett Fairy M.D.   On: 03/13/2021 02:01   CT Chest W Contrast  Result Date: 03/13/2021 CLINICAL DATA:  Abnormal chest x-ray; recent pneumonia EXAM: CT CHEST WITH CONTRAST TECHNIQUE: Multidetector CT imaging of the chest was performed during intravenous contrast administration. CONTRAST:  49m OMNIPAQUE IOHEXOL 300 MG/ML  SOLN COMPARISON:  None. FINDINGS: Cardiovascular: Normal heart size. No pericardial effusion. Thoracic aorta is normal in caliber. Coronary artery calcification. Mediastinum/Nodes: Mildly enlarged right hilar node. No mediastinal adenopathy. Lungs/Pleura: No pleural effusion or pneumothorax. Right lower lobe consolidation abutting the major fissure. There are 2 adjacent and possibly communicating areas of cavitation with air-fluid levels. Additional patchy ground-glass density and nodular opacities in both lungs. Upper Abdomen: No acute abnormality. Musculoskeletal: No acute osseous abnormality. IMPRESSION: Right lower lobe consolidation with adjacent and possibly communicating areas of cavitation likely representing pneumonia with abscess  formation. Additional patchy ground-glass density and nodular opacities in both lungs reflect multifocal pneumonia. Electronically Signed   By: PMacy MisM.D.   On: 03/13/2021 15:15    EKG: Independently reviewed.  Sinus tachycardia, no acute ST changes  Assessment/Plan Principal Problem:   PNA (pneumonia) Active Problems:   Pneumonia with cavity of lung  (please populate well all problems here in Problem List. (For example, if patient is on BP meds at home and you resume or decide to hold them, it is a problem that needs to be her. Same for CAD, COPD, HLD and so on)  Right lower lobe cavity pneumonia -Agreed with escalate antibiotic treatment to Zosyn -Discussed with pulmonology attending regarding coverage of cryptococcus as patient has close contact with bird/duck.  Pulmonology will see patient and then decide. -Sputum culture -Etiology of  the cavity pneumonia, consider one possibility of aspiration, as patient did have repeated nauseous vomiting last week.  Check speech evaluation.  AKI -Secondary from poor oral intake -IV hydration and then we will recheck kidney function tomorrow.  Hyponatremia -Secondary to AKI and dehydration, will hydrate and reevaluate.  IIDM -With hyperglycemia -Hold metformin due to IV contrast received today -Start sliding scale.  Sciatica -Continue home dose of narcotics.  DVT prophylaxis: Lovenox Code Status: Full code Family Communication: None at bedside Disposition Plan: Expect 1 to 2 days hospital stay to treat AKI and cavity pneumonia. Consults called: Pulmonary Admission status: MedSurg admission   Lequita Halt MD [facility] Pager [phone]  03/13/2021, 7:06 PM

## 2021-03-14 ENCOUNTER — Encounter (HOSPITAL_COMMUNITY): Payer: Self-pay | Admitting: Internal Medicine

## 2021-03-14 DIAGNOSIS — J984 Other disorders of lung: Secondary | ICD-10-CM

## 2021-03-14 DIAGNOSIS — J189 Pneumonia, unspecified organism: Secondary | ICD-10-CM

## 2021-03-14 LAB — CBC
HCT: 28.3 % — ABNORMAL LOW (ref 39.0–52.0)
Hemoglobin: 9.4 g/dL — ABNORMAL LOW (ref 13.0–17.0)
MCH: 27.9 pg (ref 26.0–34.0)
MCHC: 33.2 g/dL (ref 30.0–36.0)
MCV: 84 fL (ref 80.0–100.0)
Platelets: 137 10*3/uL — ABNORMAL LOW (ref 150–400)
RBC: 3.37 MIL/uL — ABNORMAL LOW (ref 4.22–5.81)
RDW: 14 % (ref 11.5–15.5)
WBC: 7.2 10*3/uL (ref 4.0–10.5)
nRBC: 0 % (ref 0.0–0.2)

## 2021-03-14 LAB — BASIC METABOLIC PANEL
Anion gap: 7 (ref 5–15)
BUN: 18 mg/dL (ref 8–23)
CO2: 28 mmol/L (ref 22–32)
Calcium: 8.8 mg/dL — ABNORMAL LOW (ref 8.9–10.3)
Chloride: 97 mmol/L — ABNORMAL LOW (ref 98–111)
Creatinine, Ser: 1.74 mg/dL — ABNORMAL HIGH (ref 0.61–1.24)
GFR, Estimated: 43 mL/min — ABNORMAL LOW (ref >60)
Glucose, Bld: 164 mg/dL — ABNORMAL HIGH (ref 70–99)
Potassium: 4.6 mmol/L (ref 3.5–5.1)
Sodium: 132 mmol/L — ABNORMAL LOW (ref 135–145)

## 2021-03-14 LAB — GLUCOSE, CAPILLARY
Glucose-Capillary: 155 mg/dL — ABNORMAL HIGH (ref 70–99)
Glucose-Capillary: 199 mg/dL — ABNORMAL HIGH (ref 70–99)
Glucose-Capillary: 337 mg/dL — ABNORMAL HIGH (ref 70–99)
Glucose-Capillary: 290 mg/dL — ABNORMAL HIGH (ref 70–99)

## 2021-03-14 MED ORDER — METHYLPREDNISOLONE SODIUM SUCC 125 MG IJ SOLR
80.0000 mg | Freq: Every day | INTRAMUSCULAR | Status: DC
  Administered 2021-03-14: 80 mg via INTRAVENOUS
  Filled 2021-03-14: qty 2

## 2021-03-14 MED ORDER — IPRATROPIUM-ALBUTEROL 0.5-2.5 (3) MG/3ML IN SOLN: 3.0000 mL | Freq: Four times a day (QID) | RESPIRATORY_TRACT | Status: DC | PRN

## 2021-03-14 MED ORDER — METOPROLOL TARTRATE 5 MG/5ML IV SOLN: 5.0000 mg | INTRAVENOUS | Status: DC | PRN

## 2021-03-14 MED ORDER — IPRATROPIUM-ALBUTEROL 0.5-2.5 (3) MG/3ML IN SOLN: 3.0000 mL | Freq: Three times a day (TID) | RESPIRATORY_TRACT | Status: DC

## 2021-03-14 MED ORDER — RISAQUAD PO CAPS
2.0000 | ORAL_CAPSULE | Freq: Three times a day (TID) | ORAL | Status: DC
  Administered 2021-03-14 – 2021-03-15 (×6): 2 via ORAL
  Filled 2021-03-14 (×6): qty 2

## 2021-03-14 MED ORDER — SODIUM CHLORIDE 0.9 % IV SOLN
100.0000 mg | Freq: Two times a day (BID) | INTRAVENOUS | Status: DC
  Administered 2021-03-14 – 2021-03-16 (×4): 100 mg via INTRAVENOUS
  Filled 2021-03-14 (×5): qty 100

## 2021-03-14 MED ORDER — METHYLPREDNISOLONE SODIUM SUCC 125 MG IJ SOLR: 80.0000 mg | INTRAMUSCULAR | Status: DC

## 2021-03-14 MED ORDER — SENNOSIDES-DOCUSATE SODIUM 8.6-50 MG PO TABS: 1.0000 | ORAL_TABLET | Freq: Every evening | ORAL | Status: DC | PRN

## 2021-03-14 MED ORDER — DM-GUAIFENESIN ER 30-600 MG PO TB12
1.0000 | ORAL_TABLET | Freq: Two times a day (BID) | ORAL | Status: DC | PRN
  Administered 2021-03-15 – 2021-03-16 (×2): 1 via ORAL
  Filled 2021-03-14 (×2): qty 1

## 2021-03-14 MED ORDER — IPRATROPIUM-ALBUTEROL 0.5-2.5 (3) MG/3ML IN SOLN: 3.0000 mL | RESPIRATORY_TRACT | Status: DC | PRN

## 2021-03-14 MED ORDER — SODIUM CHLORIDE 0.9 % IV SOLN: INTRAVENOUS | Status: DC

## 2021-03-14 MED ORDER — TRAZODONE HCL 50 MG PO TABS
50.0000 mg | ORAL_TABLET | Freq: Every evening | ORAL | Status: DC | PRN
  Administered 2021-03-14 – 2021-03-15 (×2): 50 mg via ORAL
  Filled 2021-03-14 (×2): qty 1

## 2021-03-14 NOTE — ED Notes (Signed)
ED TO INPATIENT HANDOFF REPORT  ED Nurse Name and Phone #: Verta Ellen [phone]  S Name/Age/Gender Vincent [name] 65 y.o. male Room/Bed: 042C/042C  Code Status   Code Status: Full Code  Home/SNF/Other Home Patient oriented to: self, place, time, and situation Is this baseline? Yes   Triage Complete: Triage complete  Chief Complaint PNA (pneumonia) [J18.9]  Triage Note Pt reports worsening sob since October, has been taking abx for pneumonia but symptoms have not gone away. Pt was seen here last night but LWBS after triage. Pt tachycardic 125 in triage. Resp e.u at this time.    Allergies No Known Allergies  Level of Care/Admitting Diagnosis ED Disposition     ED Disposition  Admit   Condition  --   Comment  Hospital Area: [facility] [[id number]]  Level of Care: Med-Surg [16]  May admit patient to [facility] or [facility] if equivalent level of care is available:: No  Covid Evaluation: Recent COVID positive no isolation required infection day 21-90  Diagnosis: PNA (pneumonia) [308657]  Admitting Physician: Lequita Halt [[id number]]  Attending Physician: Lequita Halt [[id number]]  Estimated length of stay: past midnight tomorrow  Certification:: I certify this patient will need inpatient services for at least 2 midnights          B Medical/Surgery History Past Medical History:  Diagnosis Date   Arthritis    Complication of anesthesia    Reported intraoperative bleeding and "almost lost him" with back surgey around age 56, but no bleeding issues with subsequent surgeries   Diabetes mellitus without complication ([facility])    per patient type 2   Facial basal cell cancer    Heart murmur    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    Nerve pain    per patient, both legs   Past Surgical History:  Procedure Laterality Date   BACK SURGERY     x2   HERNIA REPAIR     INCISION AND DRAINAGE OF WOUND Left 10/27/2016   Procedure:  IRRIGATION AND DEBRIDEMENT LEFT PATELLA;  Surgeon: Meredith Pel, MD;  Location: [facility];  Service: Orthopedics;  Laterality: Left;   LESION REMOVAL Left 11/01/2019   Procedure: LEFT FACIAL LESION REMOVAL;  Surgeon: Izora Gala, MD;  Location: [facility];  Service: ENT;  Laterality: Left;   SPINE SURGERY     x2     A IV Location/Drains/Wounds Patient Lines/Drains/Airways Status     Active Line/Drains/Airways     Name Placement date Placement time Site Days   Peripheral IV 03/13/21 20 G Anterior;Distal;Left;Upper Arm 03/13/21  1417  Arm  1   Open Drain Left Knee  10/27/16  2208  Knee  1599   Incision (Closed) 10/27/16 Knee Left 10/27/16  2229  -- 1599   Incision (Closed) 11/01/19 Face Left 11/01/19  1104  -- 499            Intake/Output Last 24 hours No intake or output data in the 24 hours ending 03/14/21 0155  Labs/Imaging Results for orders placed or performed during the hospital encounter of 03/13/21 (from the past 48 hour(s))  MRSA Next Gen by PCR, Nasal     Status: None   Collection Time: 03/13/21  4:09 PM   Specimen: Nasal Mucosa; Nasal Swab  Result Value Ref Range   MRSA by PCR Next Gen NOT DETECTED NOT DETECTED    Comment: (NOTE) The GeneXpert MRSA Assay (FDA approved for  NASAL specimens only), is one component of a comprehensive MRSA colonization surveillance program. It is not intended to diagnose MRSA infection nor to guide or monitor treatment for MRSA infections. Test performance is not FDA approved in patients less than 69 years old. Performed at [facility], [place] [address], [place], [place] [address]   Lactic acid, plasma     Status: None   Collection Time: 03/13/21  4:37 PM  Result Value Ref Range   Lactic Acid, Venous 1.4 0.5 - 1.9 mmol/L    Comment: Performed at [facility] [address], [place], [place] [address]  HIV Antibody (routine testing w rflx)     Status: None   Collection Time: 03/13/21  7:32 PM  Result Value Ref Range    HIV Screen 4th Generation wRfx Non Reactive Non Reactive    Comment: Performed at [facility], [place] [address], [place], [place] [address]  Hemoglobin A1c     Status: Abnormal   Collection Time: 03/13/21  7:32 PM  Result Value Ref Range   Hgb A1c MFr Bld 8.6 (H) 4.8 - 5.6 %    Comment: (NOTE) Pre diabetes:          5.7%-6.4%  Diabetes:              >6.4%  Glycemic control for   <7.0% adults with diabetes    Mean Plasma Glucose 200.12 mg/dL    Comment: Performed at [facility] [address], [place], [place] [address]  CBG monitoring, ED     Status: Abnormal   Collection Time: 03/13/21 11:55 PM  Result Value Ref Range   Glucose-Capillary 153 (H) 70 - 99 mg/dL    Comment: Glucose reference range applies only to samples taken after fasting for at least 8 hours.   DG Chest 2 View  Result Date: 03/13/2021 CLINICAL DATA:  Cough, recent pneumonia. EXAM: CHEST - 2 VIEW COMPARISON:  02/11/2006. FINDINGS: The heart size and mediastinal contours are within normal limits. There is consolidation in the right lower lobe with a region of air-fluid levels, suspected for cavitary lesion. The possibility of a small right pleural effusion can not be excluded. The left lung is clear. No pneumothorax is seen. No acute osseous abnormality. IMPRESSION: Consolidation with findings suggestive cavitary lesion with air-fluid levels in the right lower lobe. While findings may be infectious or inflammatory, the possibility of underlying neoplastic process can not be excluded. CT is suggested for further evaluation. Electronically Signed   By: Brett Fairy M.D.   On: 03/13/2021 02:01   CT Chest W Contrast  Result Date: 03/13/2021 CLINICAL DATA:  Abnormal chest x-ray; recent pneumonia EXAM: CT CHEST WITH CONTRAST TECHNIQUE: Multidetector CT imaging of the chest was performed during intravenous contrast administration. CONTRAST:  57mL OMNIPAQUE IOHEXOL 300 MG/ML  SOLN COMPARISON:  None. FINDINGS:  Cardiovascular: Normal heart size. No pericardial effusion. Thoracic aorta is normal in caliber. Coronary artery calcification. Mediastinum/Nodes: Mildly enlarged right hilar node. No mediastinal adenopathy. Lungs/Pleura: No pleural effusion or pneumothorax. Right lower lobe consolidation abutting the major fissure. There are 2 adjacent and possibly communicating areas of cavitation with air-fluid levels. Additional patchy ground-glass density and nodular opacities in both lungs. Upper Abdomen: No acute abnormality. Musculoskeletal: No acute osseous abnormality. IMPRESSION: Right lower lobe consolidation with adjacent and possibly communicating areas of cavitation likely representing pneumonia with abscess formation. Additional patchy ground-glass density and nodular opacities in both lungs reflect multifocal pneumonia. Electronically Signed  By: Macy Mis M.D.   On: 03/13/2021 15:15    Pending Labs Unresulted Labs (From admission, onward)     Start     Ordered   03/14/21 0500  CBC  Tomorrow morning,   R        03/13/21 1820   03/14/21 2202  Basic metabolic panel  Tomorrow morning,   R        03/13/21 1820   03/13/21 1817  Expectorated Sputum Assessment w Gram Stain, Rflx to Resp Cult  Once,   R        03/13/21 1816            Vitals/Pain Today's Vitals   03/13/21 2100 03/13/21 2200 03/14/21 0000 03/14/21 0151  BP: 95/64 90/63 100/64 94/60  Pulse: (!) 56 (!) 101 (!) 101 98  Resp: (!) 21 15 15 18   Temp:      TempSrc:      SpO2: 93% 92% 93% 94%  PainSc:        Isolation Precautions No active isolations  Medications Medications  piperacillin-tazobactam (ZOSYN) IVPB 3.375 g (3.375 g Intravenous New Bag/Given 03/13/21 2342)  diclofenac (VOLTAREN) EC tablet 75 mg (75 mg Oral Given 03/13/21 2340)  HYDROcodone-acetaminophen (NORCO/VICODIN) 5-325 MG per tablet 1.5 tablet (has no administration in time range)  oxyCODONE (Oxy IR/ROXICODONE) immediate release tablet 10 mg (has no  administration in time range)  simvastatin (ZOCOR) tablet 20 mg (20 mg Oral Given 03/13/21 1941)  promethazine (PHENERGAN) suppository 25 mg (has no administration in time range)  diclofenac Sodium (VOLTAREN) 1 % topical gel 1 application (has no administration in time range)  enoxaparin (LOVENOX) injection 40 mg (40 mg Subcutaneous Given 03/13/21 1942)  acetaminophen (TYLENOL) tablet 650 mg (has no administration in time range)    Or  acetaminophen (TYLENOL) suppository 650 mg (has no administration in time range)  ondansetron (ZOFRAN) tablet 4 mg (has no administration in time range)    Or  ondansetron (ZOFRAN) injection 4 mg (has no administration in time range)  insulin aspart (novoLOG) injection 0-15 Units (has no administration in time range)  vitamin B-12 (CYANOCOBALAMIN) tablet 500 mcg (500 mcg Oral Given 03/13/21 1941)  0.9 %  sodium chloride infusion ( Intravenous New Bag/Given 03/13/21 1943)  ipratropium-albuterol (DUONEB) 0.5-2.5 (3) MG/3ML nebulizer solution 3 mL (3 mLs Nebulization Not Given 03/14/21 0143)  benzonatate (TESSALON) capsule 200 mg (200 mg Oral Given 03/13/21 2340)  acidophilus (RISAQUAD) capsule 2 capsule (has no administration in time range)  iohexol (OMNIPAQUE) 300 MG/ML solution 75 mL (75 mLs Intravenous Contrast Given 03/13/21 1453)  piperacillin-tazobactam (ZOSYN) IVPB 3.375 g (0 g Intravenous Stopped 03/13/21 1710)    Mobility walks     Focused Assessments Pulmonary Assessment Handoff:  O2 Device: Room Air      R Recommendations: See Admitting Provider Note  Report given to: Jacqualine Code   Additional Notes:

## 2021-03-14 NOTE — Evaluation (Signed)
Clinical/Bedside Swallow Evaluation Patient Details  Name: Vincent [name] MRN: [id number] Date of Birth: 09/26/1955  Today's Date: 03/14/2021 Time: SLP Start Time (ACUTE ONLY): 1104 SLP Stop Time (ACUTE ONLY): 1130 SLP Time Calculation (min) (ACUTE ONLY): 26 min  Past Medical History:  Past Medical History:  Diagnosis Date   Arthritis    Complication of anesthesia    Reported intraoperative bleeding and "almost lost him" with back surgey around age 65, but no bleeding issues with subsequent surgeries   Diabetes mellitus without complication ([facility])    per patient type 2   Facial basal cell cancer    Heart murmur    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 male presented to ED with persistent productive cough, generalized weakness and nausea vomiting. Per pt, he had COVID Oct 2022 and hasn't felt the same since.  End of Oct or beginning of Nov, he started to have cough which progressed and started coughing up phlegm. This continued and roughly 2 weeks ago, he was seen by PCP and told that he had PNA. 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 One of them is 58 years old and lives in the house and sleeps in the bedroom with him and his wife. CXR and CT chest (03/13/21) confirm dense RLL cavitary PNA. PMH: IIDM, HTN, sciatica on narcotics.    Assessment / Plan / Recommendation  Clinical Impression  Pt alert  and sitting at EOB upon SLP arrival for bedside swallow evaluation. He reports difficulty swallowing large sips/volumes of liquids ever since COVID-19 infection. Oral mechanism examination unremarkable, however it should be noted that he presents with several missing denition and what remains is in poor condition. Baseline cough observed prior to POs, with pt expectorating mucus into receptable. Thin liquids via small and large sips via cup/straw resulted in x2 immediate overt coughing across multiple trials. Coughing not consistent with large sips. Intermittent throat clearing occured with all trials and question relation to POs given baseline cough. Vocal quality remained clear throughout. Despite lack of dentition, regular textures were masticated and orally cleared without difficulty. Given pt reports and clinical presentation this date, recommend pt continue with regular, thin liquid diet with SLP to f/u to perform ongoing dx treatment for swallow function and determine if further instrumental testing is warranted.  SLP Visit Diagnosis: Dysphagia, unspecified (R13.10)    Aspiration Risk  Mild aspiration risk    Diet Recommendation Regular;Thin liquid   Liquid Administration via: Straw;Cup Medication Administration: Whole meds with liquid Supervision: Patient able to self feed;Intermittent supervision to cue for compensatory strategies Compensations: Minimize environmental distractions;Slow rate;Small sips/bites Postural Changes: Seated upright at 90 degrees    Other  Recommendations Oral Care Recommendations: Oral care BID    Recommendations  for follow up therapy are one component of a multi-disciplinary discharge planning process, led by the attending physician.  Recommendations may be updated based on patient status, additional functional criteria and insurance authorization.  Follow up Recommendations Other (comment) (TBD)      Assistance Recommended at Discharge Other (comment) (TBD)   Functional Status Assessment Patient has had a recent decline in their functional status and demonstrates the ability to make significant improvements in function in a reasonable and predictable amount of time.  Frequency and Duration min 2x/week  2 weeks       Prognosis Prognosis for Safe Diet Advancement: Good Barriers to Reach Goals: Time post onset      Swallow Study   General Date of Onset: 02/11/21 HPI: Pt is a 65 y.o. male presented to ED with persistent productive cough, generalized weakness and nausea vomiting. Per pt, he had COVID Oct 2022 and hasn't felt the same since.  End of Oct or beginning of Nov, he started to have cough which progressed and started coughing up phlegm. This continued and roughly 2 weeks ago, he was seen by PCP and told that he had PNA. 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 One of them is 20 years old and lives in the house and sleeps in the bedroom with him and his wife. CXR and CT chest (03/13/21) confirm dense RLL cavitary PNA. PMH: IIDM, HTN, sciatica on narcotics. Type of Study: Bedside Swallow Evaluation Previous Swallow Assessment: none per EMR Diet Prior to this Study: Regular;Thin liquids Temperature Spikes Noted: No Respiratory Status: Room air History of Recent Intubation: No Behavior/Cognition: Alert;Cooperative;Pleasant mood Oral Cavity Assessment: Within Functional Limits Oral Care Completed by SLP: No Oral Cavity - Dentition: Missing dentition;Poor condition Vision: Functional for self-feeding Self-Feeding Abilities: Able to feed self Patient Positioning: Upright in bed;Postural control adequate for testing Baseline Vocal Quality: Normal Volitional Cough: Strong Volitional Swallow: Able to elicit    Oral/Motor/Sensory Function Overall Oral Motor/Sensory Function: Within functional limits   Ice Chips Ice chips: Impaired Pharyngeal Phase Impairments: Throat Clearing - Delayed   Thin Liquid Thin Liquid:  Impaired Presentation: Self Fed;Straw;Cup Pharyngeal  Phase Impairments: Throat Clearing - Delayed;Cough - Immediate    Nectar Thick Nectar Thick Liquid: Not tested   Honey Thick Honey Thick Liquid: Not tested   Puree Puree: Within functional limits   Solid     Solid: Within functional limits     [name], [place], [place] Office Number: [phone]  [name] 03/14/2021,11:51 AM

## 2021-03-14 NOTE — Plan of Care (Signed)
°  Problem: Education: Goal: Knowledge of General Education information will improve Description: Including pain rating scale, medication(s)/side effects and non-pharmacologic comfort measures Outcome: Progressing   Problem: Clinical Measurements: Goal: Ability to maintain clinical measurements within normal limits will improve Outcome: Progressing Goal: Respiratory complications will improve Outcome: Progressing   Problem: Activity: Goal: Risk for activity intolerance will decrease Outcome: Progressing   Problem: Nutrition: Goal: Adequate nutrition will be maintained Outcome: Progressing   Problem: Pain Managment: Goal: General experience of comfort will improve Outcome: Progressing   Problem: Skin Integrity: Goal: Risk for impaired skin integrity will decrease Outcome: Progressing

## 2021-03-14 NOTE — Evaluation (Signed)
Physical Therapy Evaluation Patient Details Name: Vincent [name] MRN: [id number] DOB: [birth year] Today's Date: 03/14/2021  History of Present Illness  Pt is 65 yo male admitted on 03/13/21 with cavitary PNE.  Pt with COVID about 2 months ago and persistent symptoms.  Pt with other hx of DM2, HTN, and sciatica with multiple back surgeries  Clinical Impression  Pt admitted with above diagnosis.  Pt was able to transfer, ambulate, and perform steps at independent level.  He was on RA with sats 97% and reports breathing feels much better.  Pt does have chronic back pain but motivated to move and get back to normal.  Safe to ambulate independently ; no further PT needs.        Recommendations for follow up therapy are one component of a multi-disciplinary discharge planning process, led by the attending physician.  Recommendations may be updated based on patient status, additional functional criteria and insurance authorization.  Follow Up Recommendations No PT follow up    Assistance Recommended at Discharge PRN  Functional Status Assessment Patient has not had a recent decline in their functional status  Equipment Recommendations  None recommended by PT    Recommendations for Other Services       Precautions / Restrictions Precautions Precautions: None      Mobility  Bed Mobility Overal bed mobility: Independent                  Transfers Overall transfer level: Independent                 General transfer comment: Demonstrated sit to stand without difficulty    Ambulation/Gait Ambulation/Gait assistance: Independent Gait Distance (Feet): 200 Feet Assistive device: None Gait Pattern/deviations: Trunk flexed Gait velocity: normal     General Gait Details: Trunk flexed but otherwise normal gait; no LOB; had supervision but performing at independent level  Stairs Stairs: Yes Stairs assistance: Supervision Stair Management: Alternating pattern;Forwards;One  rail Right Number of Stairs: 3    Wheelchair Mobility    Modified Rankin (Stroke Patients Only)       Balance Overall balance assessment: Independent                                           Pertinent Vitals/Pain Pain Assessment: 0-10 Pain Score: 6  (Baseline pain level) Pain Location: Chronic back Pain Descriptors / Indicators: Aching Pain Intervention(s): Limited activity within patient's tolerance;Monitored during session    Home Living Family/patient expects to be discharged to:: Private residence Living Arrangements: Spouse/significant other Available Help at Discharge: Family;Available PRN/intermittently Type of Home: House Home Access: Stairs to enter Entrance Stairs-Rails: Psychiatric nurse of Steps: 5-7   Home Layout: One level Home Equipment: None      Prior Function Prior Level of Function : Independent/Modified Independent;Driving             Mobility Comments: worked as Development worker, 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[place]; does still work around house, takes cares of ducks but reports fatgues easily and with lower endurance since COVID       Hand Dominance        Extremity/Trunk Assessment   Upper Extremity Assessment Upper Extremity Assessment: Overall WFL for tasks assessed    Lower Extremity Assessment Lower Extremity Assessment: Overall WFL for tasks assessed    Cervical / Trunk Assessment Cervical / Trunk Assessment: Kyphotic  Communication   Communication: No difficulties  Cognition Arousal/Alertness: Awake/alert Behavior During Therapy: WFL for tasks assessed/performed Overall Cognitive Status: Within Functional Limits for tasks assessed                                 General Comments: Very pleasant and motivated        General Comments General comments (skin integrity, edema, etc.): Pt on RA with sats 97% - reports breathing feels much better since admission    Exercises      Assessment/Plan    PT Assessment Patient does not need any further PT services  PT Problem List         PT Treatment Interventions      PT Goals (Current goals can be found in the Care Plan section)  Acute Rehab PT Goals PT Goal Formulation: All assessment and education complete, DC therapy    Frequency     Barriers to discharge        Co-evaluation               AM-PAC PT "6 Clicks" Mobility  Outcome Measure Help needed turning from your back to your side while in a flat bed without using bedrails?: None Help needed moving from lying on your back to sitting on the side of a flat bed without using bedrails?: None Help needed moving to and from a bed to a chair (including a wheelchair)?: None Help needed standing up from a chair using your arms (e.g., wheelchair or bedside chair)?: None Help needed to walk in hospital room?: None Help needed climbing 3-5 steps with a railing? : None 6 Click Score: 24    End of Session   Activity Tolerance: Patient tolerated treatment well Patient left: in bed;with call bell/phone within reach (no alarm - low fall risk, independent) Nurse Communication: Mobility status      Time: 3291-9166 PT Time Calculation (min) (ACUTE ONLY): 16 min   Charges:   PT Evaluation $PT Eval Low Complexity: 1 Low          Jerolene Kupfer, PT Acute Rehab Services Pager [phone] [facility] [phone]   Karlton Lemon 03/14/2021, 5:55 PM

## 2021-03-14 NOTE — Progress Notes (Signed)
NEW ADMISSION NOTE New Admission Note:   Arrival Method: [facility] Mental Orientation: A&O x4 Telemetry: None Assessment: Completed Skin: Intact IV: 20G LUA Pain: 0/10 Tubes: none present Safety Measures: Safety Fall Prevention Plan has been given, discussed and signed Admission: Completed [facility] Orientation: Patient has been orientated to the room, unit and staff. Family: none present   Orders have been reviewed and implemented. Will continue to monitor the patient. Call light has been placed within reach and bed alarm has been activated.

## 2021-03-14 NOTE — Progress Notes (Signed)
PROGRESS NOTE    Vincent [name]  VQM:[id number] DOB: 02-18-1956 DOA: 03/13/2021 PCP: Leonard Downing, MD   Brief Narrative:  65 year old with history of DM2, HTN, sciatica on narcotic presents with persistent productive cough and weakness.  Patient recovered from [place] about 2 months ago and was started on Z-Pak by his PCP for presumed pneumonia.  Due to persistence of his symptoms he comes to the ER, CT chest showed cavitary pneumonia in the right lower lobe.  He was started on Zosyn and pulmonary team was consulted.   Assessment & Plan:   Principal Problem:   PNA (pneumonia) Active Problems:   Pneumonia with cavity of lung  Right lower lobe cavity pneumonia with concerning abscess -Empiric antibiotics-Zosyn and doxycycline.  Solu-Medrol IV after bronch - There is a question of close contact with birds therefore covered for cryptococcus. - Pulmonary team consulted-hypersensitivity panel ordered.  N.p.o. for bronchoscopy tomorrow - Sputum culture - Speech and swallow evaluation - Supportive care, as needed bronchodilators, I-S/flutter valve.   AKI; Baseline 1.0. -Gentle hydration Cr now 1.74   Hyponatremia -Should improve with IV fluids   IIDM; A1c 8.6 -Metformin on hold.  On sliding scale and Accu-Cheks  Sciatica -Continue home meds  Hyperlipidemia - Zocor   DVT prophylaxis: Lovenox Code Status: Full code Family Communication:    Status is: Inpatient  Remains inpatient appropriate because: Needs IV Abx, on going eval for cavitary PNA        Subjective:   Review of Systems Otherwise negative except as per HPI, including: General: Denies fever, chills, night sweats or unintended weight loss. Resp: Denies cough, wheezing, shortness of breath. Cardiac: Denies chest pain, palpitations, orthopnea, paroxysmal nocturnal dyspnea. GI: Denies abdominal pain, nausea, vomiting, diarrhea or constipation GU: Denies dysuria, frequency, hesitancy or  incontinence MS: Denies muscle aches, joint pain or swelling Neuro: Denies headache, neurologic deficits (focal weakness, numbness, tingling), abnormal gait Psych: Denies anxiety, depression, SI/HI/AVH Skin: Denies new rashes or lesions ID: Denies sick contacts, exotic exposures, travel  Examination:  General exam: Appears calm and comfortable  Respiratory system: Clear to auscultation. Respiratory effort normal. Cardiovascular system: S1 & S2 heard, RRR. No JVD, murmurs, rubs, gallops or clicks. No pedal edema. Gastrointestinal system: Abdomen is nondistended, soft and nontender. No organomegaly or masses felt. Normal bowel sounds heard. Central nervous system: Alert and oriented. No focal neurological deficits. Extremities: Symmetric 5 x 5 power. Skin: No rashes, lesions or ulcers Psychiatry: Judgement and insight appear normal. Mood & affect appropriate.     Objective: Vitals:   03/14/21 0224 03/14/21 0227 03/14/21 0653 03/14/21 0655  BP:  113/77 99/69 104/68  Pulse:  (!) 102 76 82  Resp:  18 18   Temp:  99 F (37.2 C) 98.2 F (36.8 C)   TempSrc:  Oral Oral   SpO2:  96% 93%   Weight: 70.7 kg     Height: 5\' 10"  (1.778 m)       Intake/Output Summary (Last 24 hours) at 03/14/2021 0817 Last data filed at 03/14/2021 0400 Gross per 24 hour  Intake 464.01 ml  Output --  Net 464.01 ml   Filed Weights   03/14/21 0224  Weight: 70.7 kg     Data Reviewed:   CBC: Recent Labs  Lab 03/13/21 0135 03/14/21 0453  WBC 8.9 7.2  NEUTROABS 7.2  --   HGB 10.5* 9.4*  HCT 32.7* 28.3*  MCV 85.2 84.0  PLT 158 932*   Basic Metabolic Panel: Recent Labs  Lab 03/13/21 0135 03/14/21 0453  NA 131* 132*  K 3.9 4.6  CL 96* 97*  CO2 25 28  GLUCOSE 228* 164*  BUN 18 18  CREATININE 1.55* 1.74*  CALCIUM 9.3 8.8*   GFR: Estimated Creatinine Clearance: 42.3 mL/min (A) (by C-G formula based on SCr of 1.74 mg/dL (H)). Liver Function Tests: Recent Labs  Lab 03/13/21 0135  AST  12*  ALT 17  ALKPHOS 178*  BILITOT 1.6*  PROT 6.9  ALBUMIN 2.3*   No results for input(s): LIPASE, AMYLASE in the last 168 hours. No results for input(s): AMMONIA in the last 168 hours. Coagulation Profile: No results for input(s): INR, PROTIME in the last 168 hours. Cardiac Enzymes: No results for input(s): CKTOTAL, CKMB, CKMBINDEX, TROPONINI in the last 168 hours. BNP (last 3 results) No results for input(s): PROBNP in the last 8760 hours. HbA1C: Recent Labs    03/13/21 1932  HGBA1C 8.6*   CBG: Recent Labs  Lab 03/13/21 2355 03/14/21 0654  GLUCAP 153* 155*   Lipid Profile: No results for input(s): CHOL, HDL, LDLCALC, TRIG, CHOLHDL, LDLDIRECT in the last 72 hours. Thyroid Function Tests: No results for input(s): TSH, T4TOTAL, FREET4, T3FREE, THYROIDAB in the last 72 hours. Anemia Panel: No results for input(s): VITAMINB12, FOLATE, FERRITIN, TIBC, IRON, RETICCTPCT in the last 72 hours. Sepsis Labs: Recent Labs  Lab 03/13/21 0135 03/13/21 1637  LATICACIDVEN 1.4 1.4    Recent Results (from the past 240 hour(s))  Resp Panel by RT-PCR (Flu A&B, Covid) Nasopharyngeal Swab     Status: None   Collection Time: 03/13/21  1:10 AM   Specimen: Nasopharyngeal Swab; Nasopharyngeal(NP) swabs in vial transport medium  Result Value Ref Range Status   SARS Coronavirus 2 by RT PCR NEGATIVE NEGATIVE Final    Comment: (NOTE) SARS-CoV-2 target nucleic acids are NOT DETECTED.  The SARS-CoV-2 RNA is generally detectable in upper respiratory specimens during the acute phase of infection. The lowest concentration of SARS-CoV-2 viral copies this assay can detect is 138 copies/mL. A negative result does not preclude SARS-Cov-2 infection and should not be used as the sole basis for treatment or other patient management decisions. A negative result may occur with  improper specimen collection/handling, submission of specimen other than nasopharyngeal swab, presence of viral mutation(s)  within the areas targeted by this assay, and inadequate number of viral copies(<138 copies/mL). A negative result must be combined with clinical observations, patient history, and epidemiological information. The expected result is Negative.  Fact Sheet for Patients:  EntrepreneurPulse.com.au  Fact Sheet for Healthcare Providers:  IncredibleEmployment.be  This test is no t yet approved or cleared by the [place] FDA and  has been authorized for detection and/or diagnosis of SARS-CoV-2 by FDA under an Emergency Use Authorization (EUA). This EUA will remain  in effect (meaning this test can be used) for the duration of the COVID-19 declaration under Section 564(b)(1) of the Act, 21 U.S.C.section 360bbb-3(b)(1), unless the authorization is terminated  or revoked sooner.       Influenza A by PCR NEGATIVE NEGATIVE Final   Influenza B by PCR NEGATIVE NEGATIVE Final    Comment: (NOTE) The Xpert Xpress SARS-CoV-2/FLU/RSV plus assay is intended as an aid in the diagnosis of influenza from Nasopharyngeal swab specimens and should not be used as a sole basis for treatment. Nasal washings and aspirates are unacceptable for Xpert Xpress SARS-CoV-2/FLU/RSV testing.  Fact Sheet for Patients: EntrepreneurPulse.com.au  Fact Sheet for Healthcare Providers: IncredibleEmployment.be  This test is not  yet approved or cleared by the [place] and has been authorized for detection and/or diagnosis of SARS-CoV-2 by FDA under an Emergency Use Authorization (EUA). This EUA will remain in effect (meaning this test can be used) for the duration of the COVID-19 declaration under Section 564(b)(1) of the Act, 21 U.S.C. section 360bbb-3(b)(1), unless the authorization is terminated or revoked.  Performed at [facility], [place] [address], [place], [place] [address]   MRSA Next Gen by PCR, Nasal      Status: None   Collection Time: 03/13/21  4:09 PM   Specimen: Nasal Mucosa; Nasal Swab  Result Value Ref Range Status   MRSA by PCR Next Gen NOT DETECTED NOT DETECTED Final    Comment: (NOTE) The GeneXpert MRSA Assay (FDA approved for NASAL specimens only), is one component of a comprehensive MRSA colonization surveillance program. It is not intended to diagnose MRSA infection nor to guide or monitor treatment for MRSA infections. Test performance is not FDA approved in patients less than 44 years old. Performed at [facility], [place] [address] [place], [place] [address]          Radiology Studies: DG Chest 2 View  Result Date: 03/13/2021 CLINICAL DATA:  Cough, recent pneumonia. EXAM: CHEST - 2 VIEW COMPARISON:  02/11/2006. FINDINGS: The heart size and mediastinal contours are within normal limits. There is consolidation in the right lower lobe with a region of air-fluid levels, suspected for cavitary lesion. The possibility of a small right pleural effusion can not be excluded. The left lung is clear. No pneumothorax is seen. No acute osseous abnormality. IMPRESSION: Consolidation with findings suggestive cavitary lesion with air-fluid levels in the right lower lobe. While findings may be infectious or inflammatory, the possibility of underlying neoplastic process can not be excluded. CT is suggested for further evaluation. Electronically Signed   By: Brett Fairy M.D.   On: 03/13/2021 02:01   CT Chest W Contrast  Result Date: 03/13/2021 CLINICAL DATA:  Abnormal chest x-ray; recent pneumonia EXAM: CT CHEST WITH CONTRAST TECHNIQUE: Multidetector CT imaging of the chest was performed during intravenous contrast administration. CONTRAST:  14mL OMNIPAQUE IOHEXOL 300 MG/ML  SOLN COMPARISON:  None. FINDINGS: Cardiovascular: Normal heart size. No pericardial effusion. Thoracic aorta is normal in caliber. Coronary artery calcification. Mediastinum/Nodes: Mildly enlarged right hilar node.  No mediastinal adenopathy. Lungs/Pleura: No pleural effusion or pneumothorax. Right lower lobe consolidation abutting the major fissure. There are 2 adjacent and possibly communicating areas of cavitation with air-fluid levels. Additional patchy ground-glass density and nodular opacities in both lungs. Upper Abdomen: No acute abnormality. Musculoskeletal: No acute osseous abnormality. IMPRESSION: Right lower lobe consolidation with adjacent and possibly communicating areas of cavitation likely representing pneumonia with abscess formation. Additional patchy ground-glass density and nodular opacities in both lungs reflect multifocal pneumonia. Electronically Signed   By: Macy Mis M.D.   On: 03/13/2021 15:15        Scheduled Meds:  acidophilus  2 capsule Oral TID   benzonatate  200 mg Oral BID   diclofenac  75 mg Oral BID   enoxaparin (LOVENOX) injection  40 mg Subcutaneous Q24H   insulin aspart  0-15 Units Subcutaneous TID WC   methylPREDNISolone (SOLU-MEDROL) injection  80 mg Intravenous Daily   simvastatin  20 mg Oral QPM   vitamin B-12  500 mcg Oral Daily   Continuous Infusions:  sodium chloride 100 mL/hr at 03/13/21 1943   piperacillin-tazobactam (ZOSYN)  IV Stopped (03/14/21 0342)  LOS: 1 day   Time spent= 35 mins    Ashima Shrake Arsenio Loader, MD [facility]  If 7PM-7AM, please contact night-coverage  03/14/2021, 8:17 AM

## 2021-03-14 NOTE — TOC Initial Note (Signed)
Transition of Care [facility]) - Initial/Assessment Note    Patient Details  Name: Vincent [name] MRN: [id number] Date of Birth: 1955-10-27  [facility] CM/SW Contact:    Tom-Johnson, Renea Ee, RN Phone Number: 03/14/2021, 1:29 PM  Clinical Narrative:                 CM spoke with patient at bedside about needs for post hospital transition. Patient is admitted and treated for Pneumonia. States he lives at home with his wife. Does not have children. Has two siblings but no relationship. Does not have DME's at home. 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 PCP is  Leonard Downing, MD and uses [facility]. PT/OT to eval for disposition. CM will follow with needs.     Barriers to Discharge: Continued Medical Work up   Patient Goals and CMS Choice Patient states their goals for this hospitalization and ongoing recovery are:: To go home CMS Medicare.gov Compare Post Acute Care list provided to:: Patient    Expected Discharge Plan and Services     Discharge Planning Services: CM Consult   Living arrangements for the past 2 months: Single Family Home                                      Prior Living Arrangements/Services Living arrangements for the past 2 months: Single Family Home Lives with:: Spouse Patient language and need for interpreter reviewed:: Yes Do you feel safe going back to the place where you live?: Yes      Need for Family Participation in Patient Care: Yes (Comment) Care giver support system in place?: Yes (comment)   Criminal Activity/Legal Involvement Pertinent to Current Situation/Hospitalization: No - Comment as needed  Activities of Daily Living Home Assistive Devices/Equipment: None ADL Screening (condition at time of admission) Patient's cognitive ability adequate to safely complete daily activities?: Yes Is the patient deaf or have difficulty hearing?: No Does the patient have difficulty  seeing, even when wearing glasses/contacts?: No Does the patient have difficulty concentrating, remembering, or making decisions?: No Patient able to express need for assistance with ADLs?: Yes Does the patient have difficulty dressing or bathing?: No Independently performs ADLs?: Yes (appropriate for developmental age) Does the patient have difficulty walking or climbing stairs?: Yes Weakness of Legs: None Weakness of Arms/Hands: None  Permission Sought/Granted Permission sought to share information with : Case Manager, Family Supports Permission granted to share information with : Yes, Verbal Permission Granted              Emotional Assessment Appearance:: Appears stated age Attitude/Demeanor/Rapport: Engaged, Gracious Affect (typically observed): Accepting, Appropriate, Calm, Hopeful Orientation: : Oriented to Self, Oriented to Place, Oriented to  Time, Oriented to Situation Alcohol / Substance Use: Not Applicable Psych Involvement: No (comment)  Admission diagnosis:  PNA (pneumonia) [J18.9] Community acquired pneumonia of right lower lobe of lung [J18.9] Patient Active Problem List   Diagnosis Date Noted   Pneumonia with cavity of lung 03/13/2021   PNA (pneumonia) 03/13/2021   Diabetes mellitus with complication ([place]) 47/82/9562   Lactic acidosis 10/28/2016   Hypertension 10/28/2016   Septic prepatellar bursitis 10/27/2016   Narcotic drug use 12/07/2013   Chronic low back pain 10/12/2010   PCP:  Leonard Downing, MD Pharmacy:   [place], [place] - [place] [phone]  [place] [address] Phone: [phone] Fax: [place], [place] [address] Phone: [phone] Fax: [phone]  [facility] [id number] [place], [place]  [address] Phone: [phone] Fax: [phone]     Social Determinants of Health (SDOH) Interventions    Readmission Risk Interventions No flowsheet data found.

## 2021-03-15 ENCOUNTER — Inpatient Hospital Stay (HOSPITAL_COMMUNITY): Payer: Medicare Other | Admitting: Anesthesiology

## 2021-03-15 ENCOUNTER — Inpatient Hospital Stay (HOSPITAL_COMMUNITY): Payer: Medicare Other

## 2021-03-15 ENCOUNTER — Encounter (HOSPITAL_COMMUNITY): Payer: Self-pay | Admitting: Internal Medicine

## 2021-03-15 ENCOUNTER — Encounter (HOSPITAL_COMMUNITY)
Admission: EM | Disposition: A | Discharge status: Home / Self Care | Payer: Self-pay | Source: Home / Self Care | Attending: Internal Medicine

## 2021-03-15 DIAGNOSIS — J189 Pneumonia, unspecified organism: Secondary | ICD-10-CM | POA: Diagnosis not present

## 2021-03-15 HISTORY — PX: BRONCHIAL WASHINGS: SHX5105

## 2021-03-15 HISTORY — PX: VIDEO BRONCHOSCOPY: SHX5072

## 2021-03-15 HISTORY — PX: BRONCHIAL BIOPSY: SHX5109

## 2021-03-15 LAB — CBC
HCT: 29.9 % — ABNORMAL LOW (ref 39.0–52.0)
Hemoglobin: 9.6 g/dL — ABNORMAL LOW (ref 13.0–17.0)
MCH: 27 pg (ref 26.0–34.0)
MCHC: 32.1 g/dL (ref 30.0–36.0)
MCV: 84.2 fL (ref 80.0–100.0)
Platelets: 144 10*3/uL — ABNORMAL LOW (ref 150–400)
RBC: 3.55 MIL/uL — ABNORMAL LOW (ref 4.22–5.81)
RDW: 13.6 % (ref 11.5–15.5)
WBC: 7.2 10*3/uL (ref 4.0–10.5)
nRBC: 0 % (ref 0.0–0.2)

## 2021-03-15 LAB — BODY FLUID CELL COUNT WITH DIFFERENTIAL
Eos, Fluid: 0 %
Lymphs, Fluid: 0 %
Monocyte-Macrophage-Serous Fluid: 4 % — ABNORMAL LOW (ref 50–90)
Neutrophil Count, Fluid: 96 % — ABNORMAL HIGH (ref 0–25)
Total Nucleated Cell Count, Fluid: 4425 cu mm — ABNORMAL HIGH (ref 0–1000)

## 2021-03-15 LAB — BASIC METABOLIC PANEL
Anion gap: 6 (ref 5–15)
BUN: 23 mg/dL (ref 8–23)
CO2: 27 mmol/L (ref 22–32)
Calcium: 9.1 mg/dL (ref 8.9–10.3)
Chloride: 101 mmol/L (ref 98–111)
Creatinine, Ser: 1.65 mg/dL — ABNORMAL HIGH (ref 0.61–1.24)
GFR, Estimated: 46 mL/min — ABNORMAL LOW (ref >60)
Glucose, Bld: 214 mg/dL — ABNORMAL HIGH (ref 70–99)
Potassium: 4.7 mmol/L (ref 3.5–5.1)
Sodium: 134 mmol/L — ABNORMAL LOW (ref 135–145)

## 2021-03-15 LAB — EXPECTORATED SPUTUM ASSESSMENT W GRAM STAIN, RFLX TO RESP C

## 2021-03-15 LAB — GLUCOSE, CAPILLARY
Glucose-Capillary: 208 mg/dL — ABNORMAL HIGH (ref 70–99)
Glucose-Capillary: 176 mg/dL — ABNORMAL HIGH (ref 70–99)
Glucose-Capillary: 175 mg/dL — ABNORMAL HIGH (ref 70–99)
Glucose-Capillary: 334 mg/dL — ABNORMAL HIGH (ref 70–99)
Glucose-Capillary: 207 mg/dL — ABNORMAL HIGH (ref 70–99)

## 2021-03-15 LAB — MAGNESIUM: Magnesium: 2.2 mg/dL (ref 1.7–2.4)

## 2021-03-15 SURGERY — BRONCHOSCOPY, WITH FLUOROSCOPY
Anesthesia: Monitor Anesthesia Care

## 2021-03-15 MED ORDER — FENTANYL CITRATE (PF) 250 MCG/5ML IJ SOLN
INTRAMUSCULAR | Status: DC | PRN
  Administered 2021-03-15: 100 ug via INTRAVENOUS

## 2021-03-15 MED ORDER — DEXAMETHASONE SODIUM PHOSPHATE 10 MG/ML IJ SOLN
INTRAMUSCULAR | Status: DC | PRN
  Administered 2021-03-15: 10 mg via INTRAVENOUS

## 2021-03-15 MED ORDER — CHLORHEXIDINE GLUCONATE 0.12 % MT SOLN
15.0000 mL | Freq: Once | OROMUCOSAL | Status: AC
  Filled 2021-03-15: qty 15

## 2021-03-15 MED ORDER — PROPOFOL 10 MG/ML IV BOLUS
INTRAVENOUS | Status: DC | PRN
  Administered 2021-03-15: 150 mg via INTRAVENOUS

## 2021-03-15 MED ORDER — SUGAMMADEX SODIUM 200 MG/2ML IV SOLN
INTRAVENOUS | Status: DC | PRN
  Administered 2021-03-15: 300 mg via INTRAVENOUS

## 2021-03-15 MED ORDER — PROMETHAZINE HCL 25 MG/ML IJ SOLN: 6.2500 mg | INTRAMUSCULAR | Status: DC | PRN

## 2021-03-15 MED ORDER — AMISULPRIDE (ANTIEMETIC) 5 MG/2ML IV SOLN: 10.0000 mg | Freq: Once | INTRAVENOUS | Status: DC | PRN

## 2021-03-15 MED ORDER — LACTATED RINGERS IV SOLN: INTRAVENOUS | Status: DC

## 2021-03-15 MED ORDER — ROCURONIUM BROMIDE 10 MG/ML (PF) SYRINGE
PREFILLED_SYRINGE | INTRAVENOUS | Status: DC | PRN
  Administered 2021-03-15: 70 mg via INTRAVENOUS

## 2021-03-15 MED ORDER — OXYCODONE HCL 5 MG/5ML PO SOLN: 5.0000 mg | Freq: Once | ORAL | Status: DC | PRN

## 2021-03-15 MED ORDER — LIDOCAINE 2% (20 MG/ML) 5 ML SYRINGE
INTRAMUSCULAR | Status: DC | PRN
  Administered 2021-03-15: 60 mg via INTRAVENOUS

## 2021-03-15 MED ORDER — MEPERIDINE HCL 100 MG/ML IJ SOLN: 6.2500 mg | INTRAMUSCULAR | Status: DC | PRN

## 2021-03-15 MED ORDER — INSULIN ASPART 100 UNIT/ML IJ SOLN
0.0000 [IU] | Freq: Three times a day (TID) | INTRAMUSCULAR | Status: DC
  Administered 2021-03-15: 11 [IU] via SUBCUTANEOUS
  Administered 2021-03-15: 3 [IU] via SUBCUTANEOUS
  Administered 2021-03-16: 2 [IU] via SUBCUTANEOUS

## 2021-03-15 MED ORDER — CHLORHEXIDINE GLUCONATE 0.12 % MT SOLN
OROMUCOSAL | Status: AC
  Administered 2021-03-15: 10:00:00 15 mL via OROMUCOSAL
  Filled 2021-03-15: qty 15

## 2021-03-15 MED ORDER — PHENYLEPHRINE 40 MCG/ML (10ML) SYRINGE FOR IV PUSH (FOR BLOOD PRESSURE SUPPORT)
PREFILLED_SYRINGE | INTRAVENOUS | Status: DC | PRN
  Administered 2021-03-15: 120 ug via INTRAVENOUS

## 2021-03-15 MED ORDER — MIDAZOLAM HCL 2 MG/2ML IJ SOLN
INTRAMUSCULAR | Status: DC | PRN
  Administered 2021-03-15: 2 mg via INTRAVENOUS

## 2021-03-15 MED ORDER — ONDANSETRON HCL 4 MG/2ML IJ SOLN
INTRAMUSCULAR | Status: DC | PRN
  Administered 2021-03-15: 4 mg via INTRAVENOUS

## 2021-03-15 MED ORDER — INSULIN GLARGINE-YFGN 100 UNIT/ML ~~LOC~~ SOLN
10.0000 [IU] | Freq: Every day | SUBCUTANEOUS | Status: DC
  Administered 2021-03-15: 10 [IU] via SUBCUTANEOUS
  Filled 2021-03-15 (×2): qty 0.1

## 2021-03-15 MED ORDER — OXYCODONE HCL 5 MG PO TABS: 5.0000 mg | ORAL_TABLET | Freq: Once | ORAL | Status: DC | PRN

## 2021-03-15 MED ORDER — HYDROMORPHONE HCL 1 MG/ML IJ SOLN: 0.2500 mg | INTRAMUSCULAR | Status: DC | PRN

## 2021-03-15 NOTE — Progress Notes (Signed)
Speech Language Pathology Treatment: Dysphagia  Patient Details Name: Vincent [name] MRN: [id number] DOB: 1955/04/14 Today's Date: 03/15/2021 Time: 8638-1771 SLP Time Calculation (min) (ACUTE ONLY): 28 min  Assessment / Plan / Recommendation Clinical Impression  Pt continues to present with baseline cough/throat clearing outside of PO intake. When provided with thin liquid (milk) and regular textured solid (graham cracker), pt placed solid into liquid and consumed via teaspoon, demonstrating intermittent throat clear, though less consistent than during eval and without wet vocal quality. Pills taken whole with thin liquids via straw were consumed without overt s/sx of aspiration. Throat clearing noted post intake, but suspect no relation. Given clinical presentation this date and overall improvement of symptoms, suspect oropharyngeal swallow is functional. Recommend continue regular, thin liquid diet at this time with adherence to universal swallow precautions. Above dicussed with pt and RN who expressed understanding and agreement. No further SLP f/u warranted. Will s/o.    HPI HPI: Pt is a 65 y.o. male presented to ED with persistent productive cough, generalized weakness and nausea vomiting. Per pt, he had COVID Oct 2022 and hasn't felt the same since.  End of Oct or beginning of Nov, he started to have cough which progressed and started coughing up phlegm. This continued and roughly 2 weeks ago, he was seen by PCP and told that he had PNA. 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 One of them is 80 years old and lives in the house and sleeps in the bedroom with him and his wife. CXR and CT chest (03/13/21) confirm dense RLL cavitary PNA. PMH: IIDM, HTN, sciatica on narcotics.      SLP Plan  Discharge SLP treatment due to (comment);All goals met      Recommendations for follow up therapy are one component of a multi-disciplinary discharge planning process, led by the attending  physician.  Recommendations may be updated based on patient status, additional functional criteria and insurance authorization.    Recommendations  Diet recommendations: Regular;Thin liquid Liquids provided via: Straw;Cup Medication Administration: Whole meds with liquid Supervision: Patient able to self feed Compensations: Minimize environmental distractions;Slow rate;Small sips/bites Postural Changes and/or Swallow Maneuvers: Seated upright 90 degrees                Oral Care Recommendations: Oral care BID Follow Up Recommendations: No SLP follow up Assistance recommended at discharge: PRN SLP Visit Diagnosis: Dysphagia, unspecified (R13.10) Plan: Discharge SLP treatment due to (comment);All goals met         [name], [place], [place] Office Number: [phone]   [name]  03/15/2021, 1:48 PM

## 2021-03-15 NOTE — Interval H&P Note (Signed)
History and Physical Interval Note:  03/15/2021 10:54 AM  Vincent [name]  has presented today for surgery, with the diagnosis of pneumonia.  The various methods of treatment have been discussed with the patient and family. After consideration of risks, benefits and other options for treatment, the patient has consented to  Procedure(s): VIDEO BRONCHOSCOPY WITH FLUORO (N/A) as a surgical intervention.  The patient's history has been reviewed, patient examined, no change in status, stable for surgery.  I have reviewed the patient's chart and labs.  Questions were answered to the patient's satisfaction.     Makala Fetterolf A Ho Parisi

## 2021-03-15 NOTE — Progress Notes (Signed)
PROGRESS NOTE    Vincent [name]  XKG:[id number] DOB: 04-14-1955 DOA: 03/13/2021 PCP: Leonard Downing, MD   Brief Narrative:  65 year old with history of DM2, HTN, sciatica on narcotic presents with persistent productive cough and weakness.  Patient recovered from [place] about 2 months ago and was started on Z-Pak by his PCP for presumed pneumonia.  Due to persistence of his symptoms he comes to the ER, CT chest showed cavitary pneumonia in the right lower lobe.  He was started on Zosyn and pulmonary team was consulted.  Hypersensitivity panel was sent, pulmonary team plan for bronchoscopy.   Assessment & Plan:   Principal Problem:   PNA (pneumonia) Active Problems:   Pneumonia with cavity of lung  Right lower lobe cavity pneumonia with concerning abscess - Continue empiric Zosyn and doxycycline..  Solu-Medrol IV after bronch - There is a question of close contact with birds therefore covered for cryptococcus. - Pulmonary team following.  Hypersensitivity panel sent.  Bronchoscopy today. - Sputum culture - Speech and swallow evaluation-did well. - Supportive care, as needed bronchodilators, I-S/flutter valve.   AKI; Baseline 1.0. -Gentle hydration Cr now 1.74. BMP Pending.    Hyponatremia -Should improve with IV fluids   IIDM; A1c 8.6. Uncontrolled due to hyperglycemia -Metformin on hold.  On sliding scale and Accu-Cheks. Add Semglee 10U daily  Sciatica -Continue home meds  Hyperlipidemia - Zocor   DVT prophylaxis: Lovenox Code Status: Full code Family Communication:    Status is: Inpatient  Remains inpatient appropriate because: Needs IV Abx, on going eval for cavitary PNA. Plans for bronch today.    Subjective: Doing okay no complaints.  Feeling little better  Examination: Constitutional: Not in acute distress Respiratory: Mild rhonchi on the right side Cardiovascular: Normal sinus rhythm, no rubs Abdomen: Nontender nondistended good bowel  sounds Musculoskeletal: No edema noted Skin: No rashes seen Neurologic: CN 2-12 grossly intact.  And nonfocal Psychiatric: Normal judgment and insight. Alert and oriented x 3. Normal mood.  Objective: Vitals:   03/14/21 0935 03/14/21 1641 03/14/21 2111 03/15/21 0609  BP: 119/66 109/73 121/63 107/67  Pulse: 89 96 91 76  Resp: 18 17 18 18   Temp: (!) 97.5 F (36.4 C) (!) 97.5 F (36.4 C) 98 F (36.7 C) 98 F (36.7 C)  TempSrc: Oral Oral Oral Oral  SpO2: 96% 97% 97% 95%  Weight:      Height:        Intake/Output Summary (Last 24 hours) at 03/15/2021 0801 Last data filed at 03/15/2021 0500 Gross per 24 hour  Intake 2882.11 ml  Output --  Net 2882.11 ml   Filed Weights   03/14/21 0224  Weight: 70.7 kg     Data Reviewed:   CBC: Recent Labs  Lab 03/13/21 0135 03/14/21 0453 03/15/21 0717  WBC 8.9 7.2 7.2  NEUTROABS 7.2  --   --   HGB 10.5* 9.4* 9.6*  HCT 32.7* 28.3* 29.9*  MCV 85.2 84.0 84.2  PLT 158 137* 702*   Basic Metabolic Panel: Recent Labs  Lab 03/13/21 0135 03/14/21 0453  NA 131* 132*  K 3.9 4.6  CL 96* 97*  CO2 25 28  GLUCOSE 228* 164*  BUN 18 18  CREATININE 1.55* 1.74*  CALCIUM 9.3 8.8*   GFR: Estimated Creatinine Clearance: 42.3 mL/min (A) (by C-G formula based on SCr of 1.74 mg/dL (H)). Liver Function Tests: Recent Labs  Lab 03/13/21 0135  AST 12*  ALT 17  ALKPHOS 178*  BILITOT 1.6*  PROT  6.9  ALBUMIN 2.3*   No results for input(s): LIPASE, AMYLASE in the last 168 hours. No results for input(s): AMMONIA in the last 168 hours. Coagulation Profile: No results for input(s): INR, PROTIME in the last 168 hours. Cardiac Enzymes: No results for input(s): CKTOTAL, CKMB, CKMBINDEX, TROPONINI in the last 168 hours. BNP (last 3 results) No results for input(s): PROBNP in the last 8760 hours. HbA1C: Recent Labs    03/13/21 1932  HGBA1C 8.6*   CBG: Recent Labs  Lab 03/14/21 0654 03/14/21 1131 03/14/21 1640 03/14/21 2111  03/15/21 0726  GLUCAP 155* 199* 337* 290* 208*   Lipid Profile: No results for input(s): CHOL, HDL, LDLCALC, TRIG, CHOLHDL, LDLDIRECT in the last 72 hours. Thyroid Function Tests: No results for input(s): TSH, T4TOTAL, FREET4, T3FREE, THYROIDAB in the last 72 hours. Anemia Panel: No results for input(s): VITAMINB12, FOLATE, FERRITIN, TIBC, IRON, RETICCTPCT in the last 72 hours. Sepsis Labs: Recent Labs  Lab 03/13/21 0135 03/13/21 1637  LATICACIDVEN 1.4 1.4    Recent Results (from the past 240 hour(s))  Resp Panel by RT-PCR (Flu A&B, Covid) Nasopharyngeal Swab     Status: None   Collection Time: 03/13/21  1:10 AM   Specimen: Nasopharyngeal Swab; Nasopharyngeal(NP) swabs in vial transport medium  Result Value Ref Range Status   SARS Coronavirus 2 by RT PCR NEGATIVE NEGATIVE Final    Comment: (NOTE) SARS-CoV-2 target nucleic acids are NOT DETECTED.  The SARS-CoV-2 RNA is generally detectable in upper respiratory specimens during the acute phase of infection. The lowest concentration of SARS-CoV-2 viral copies this assay can detect is 138 copies/mL. A negative result does not preclude SARS-Cov-2 infection and should not be used as the sole basis for treatment or other patient management decisions. A negative result may occur with  improper specimen collection/handling, submission of specimen other than nasopharyngeal swab, presence of viral mutation(s) within the areas targeted by this assay, and inadequate number of viral copies(<138 copies/mL). A negative result must be combined with clinical observations, patient history, and epidemiological information. The expected result is Negative.  Fact Sheet for Patients:  EntrepreneurPulse.com.au  Fact Sheet for Healthcare Providers:  IncredibleEmployment.be  This test is no t yet approved or cleared by the [place] FDA and  has been authorized for detection and/or diagnosis of SARS-CoV-2  by FDA under an Emergency Use Authorization (EUA). This EUA will remain  in effect (meaning this test can be used) for the duration of the COVID-19 declaration under Section 564(b)(1) of the Act, 21 U.S.C.section 360bbb-3(b)(1), unless the authorization is terminated  or revoked sooner.       Influenza A by PCR NEGATIVE NEGATIVE Final   Influenza B by PCR NEGATIVE NEGATIVE Final    Comment: (NOTE) The Xpert Xpress SARS-CoV-2/FLU/RSV plus assay is intended as an aid in the diagnosis of influenza from Nasopharyngeal swab specimens and should not be used as a sole basis for treatment. Nasal washings and aspirates are unacceptable for Xpert Xpress SARS-CoV-2/FLU/RSV testing.  Fact Sheet for Patients: EntrepreneurPulse.com.au  Fact Sheet for Healthcare Providers: IncredibleEmployment.be  This test is not yet approved or cleared by the [place] FDA and has been authorized for detection and/or diagnosis of SARS-CoV-2 by FDA under an Emergency Use Authorization (EUA). This EUA will remain in effect (meaning this test can be used) for the duration of the COVID-19 declaration under Section 564(b)(1) of the Act, 21 U.S.C. section 360bbb-3(b)(1), unless the authorization is terminated or revoked.  Performed at [facility],  [place] [address] [place], [place] [address]   Blood culture (routine x 2)     Status: None (Preliminary result)   Collection Time: 03/13/21  1:15 AM   Specimen: BLOOD  Result Value Ref Range Status   Specimen Description BLOOD RIGHT ARM  Final   Special Requests   Final    BOTTLES DRAWN AEROBIC AND ANAEROBIC Blood Culture adequate volume   Culture   Final    NO GROWTH 2 DAYS Performed at [facility], [place] [address], [place], [place] [address]    Report Status PENDING  Incomplete  Blood culture (routine x 2)     Status: None (Preliminary result)   Collection Time: 03/13/21  1:32 AM   Specimen:  BLOOD  Result Value Ref Range Status   Specimen Description BLOOD LEFT ARM  Final   Special Requests   Final    BOTTLES DRAWN AEROBIC ONLY Blood Culture adequate volume   Culture   Final    NO GROWTH 2 DAYS Performed at [facility], [place] [address], [place], [place] [address]    Report Status PENDING  Incomplete  MRSA Next Gen by PCR, Nasal     Status: None   Collection Time: 03/13/21  4:09 PM   Specimen: Nasal Mucosa; Nasal Swab  Result Value Ref Range Status   MRSA by PCR Next Gen NOT DETECTED NOT DETECTED Final    Comment: (NOTE) The GeneXpert MRSA Assay (FDA approved for NASAL specimens only), is one component of a comprehensive MRSA colonization surveillance program. It is not intended to diagnose MRSA infection nor to guide or monitor treatment for MRSA infections. Test performance is not FDA approved in patients less than 83 years old. Performed at [facility], [place] [address], [place], [place] [address]          Radiology Studies: CT Chest W Contrast  Result Date: 03/13/2021 CLINICAL DATA:  Abnormal chest x-ray; recent pneumonia EXAM: CT CHEST WITH CONTRAST TECHNIQUE: Multidetector CT imaging of the chest was performed during intravenous contrast administration. CONTRAST:  11mL OMNIPAQUE IOHEXOL 300 MG/ML  SOLN COMPARISON:  None. FINDINGS: Cardiovascular: Normal heart size. No pericardial effusion. Thoracic aorta is normal in caliber. Coronary artery calcification. Mediastinum/Nodes: Mildly enlarged right hilar node. No mediastinal adenopathy. Lungs/Pleura: No pleural effusion or pneumothorax. Right lower lobe consolidation abutting the major fissure. There are 2 adjacent and possibly communicating areas of cavitation with air-fluid levels. Additional patchy ground-glass density and nodular opacities in both lungs. Upper Abdomen: No acute abnormality. Musculoskeletal: No acute osseous abnormality. IMPRESSION: Right lower lobe consolidation with adjacent and  possibly communicating areas of cavitation likely representing pneumonia with abscess formation. Additional patchy ground-glass density and nodular opacities in both lungs reflect multifocal pneumonia. Electronically Signed   By: Macy Mis M.D.   On: 03/13/2021 15:15        Scheduled Meds:  acidophilus  2 capsule Oral TID   benzonatate  200 mg Oral BID   diclofenac  75 mg Oral BID   enoxaparin (LOVENOX) injection  40 mg Subcutaneous Q24H   insulin aspart  0-15 Units Subcutaneous TID WC   simvastatin  20 mg Oral QPM   vitamin B-12  500 mcg Oral Daily   Continuous Infusions:  sodium chloride 75 mL/hr at 03/14/21 1801   doxycycline (VIBRAMYCIN) IV 100 mg (03/15/21 0200)   piperacillin-tazobactam (ZOSYN)  IV 3.375 g (03/14/21 2305)     LOS: 2 days   Time spent= 35 mins  Jerrell Mangel Arsenio Loader, MD [facility]  If 7PM-7AM, please contact night-coverage  03/15/2021, 8:01 AM

## 2021-03-15 NOTE — Progress Notes (Signed)
° °  NAME:  Vincent [name], MRN:  [id number], DOB:  03-25-1956, LOS: 2 ADMISSION DATE:  03/13/2021, CONSULTATION DATE:  03/14/21 REFERRING MD:  Dr Roosevelt Locks, CHIEF COMPLAINT:  cavitary pneumonia   History of Present Illness:  65 year old male who presented to the ED 12/20 w/ cc: shortness of breath, and productive cough w/ hemoptysis. Per patient had COVID Oct 2022 and hasn't felt the same since.  Starting around end of Oct or beginning of Nov, he started to have cough which progressed and started coughing up phlegm.  This continued and roughly 2 weeks ago, he was seen by PCP and told that he had pneumonia (initially treated with Z-Pak and steroids with minimal improvement so then changed to different abx which he could not recall; however, could not tolerate 2nd round d/t Nausea).    In ED, CXR w/ RLL cavitary changes. Because of this CT chest obtained confirming dense RLL cavitary PNA. PCCM asked to eval.   He has had ongoing cough, initially green colored sputum then later started to turn pink colored.  No fevers/chills/sweats, myalgias, exposures to known sick contacts. 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 One of them is 23 years old and lives in the house and sleeps in the bedroom with him and his wife.  Pertinent  Medical History  Type 2 diabetes, COVID infection in October 2022, facial basal cell carcinoma, chronic heart murmur  Significant Hospital Events: Including procedures, antibiotic start and stop dates in addition to other pertinent events   12/20 admitted with cavitary pneumonia, placed on Zosyn and doxycycline  Interim History / Subjective:  Slept well, denies any significant complaints this morning  Objective   Blood pressure 108/65, pulse 77, temperature 98.1 F (36.7 C), temperature source Oral, resp. rate 17, height _0  (1.778 m), weight 70.7 kg, SpO2 96 %.        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

## 2021-03-15 NOTE — Transfer of Care (Signed)
Immediate Anesthesia Transfer of Care Note  Patient: Vincent [name]  Procedure(s) Performed: VIDEO BRONCHOSCOPY WITH FLUORO BRONCHIAL WASHINGS BRONCHIAL BIOPSIES  Patient Location: PACU  Anesthesia Type:General  Level of Consciousness: awake  Airway & Oxygen Therapy: Patient Spontanous Breathing and Patient connected to face mask oxygen  Post-op Assessment: Report given to RN and Post -op Vital signs reviewed and stable  Post vital signs: Reviewed and stable  Last Vitals:  Vitals Value Taken Time  BP 127/61 03/15/21 1158  Temp    Pulse 106 03/15/21 1159  Resp 13 03/15/21 1159  SpO2 100 % 03/15/21 1159  Vitals shown include unvalidated device data.  Last Pain:  Vitals:   03/15/21 0826  TempSrc: Oral  PainSc:          Complications: No notable events documented.

## 2021-03-15 NOTE — Brief Op Note (Signed)
Bronchoscopy Procedure Note  Date of Operation: 03/15/2021   Pre-op Diagnosis: pneumonia  Post-op Diagnosis: pneumonia  Surgeon: Sherrilyn Rist  Anesthesia: Monitored Local Anesthesia with Sedation  Operation: Video Flexible fiberoptic bronchoscopy, diagnostic   Findings: secretions bilateral airway  Specimen: biopsies RLL BAL RLL  Estimated Blood Loss: 5cc  Complications: none  Indications and History: See updated H and P same date. The risks, benefits, complications, treatment options and expected outcomes were discussed with the patient.  The possibilities of reaction to medication, pulmonary aspiration, perforation of a viscus, bleeding, failure to diagnose a condition and creating a complication requiring transfusion or operation were discussed with the patient who freely signed the consent.    Description of Procedure: The patient was re-examined in the bronchoscopy suite and the site of surgery properly noted/marked.  The patient was identified  and the procedure verified as Flexible Fiberoptic Bronchoscopy.  A Time Out was held and the above information confirmed.   The patient was positioned  and the bronchoscope was passed through the endotracheal tube naris. The scope was then passed into the trachea.  Airways inspected bilaterally to the subsegmental level with the following findings: Mucoid secretions bilaterally Area was examined with no significant lesions  Interventions: Bronchoalveolar lavage right lower lobe, transbronchial biopsies right lower lobe     The Patient was taken to the Endoscopy Recovery area in satisfactory condition.  Attestation: I performed the procedure.  Sherrilyn Rist, MD

## 2021-03-15 NOTE — H&P (View-Only) (Signed)
° °  NAME:  Vincent [name], MRN:  [id number], DOB:  1955-05-05, LOS: 2 ADMISSION DATE:  03/13/2021, CONSULTATION DATE:  03/14/21 REFERRING MD:  Dr Roosevelt Locks, CHIEF COMPLAINT:  cavitary pneumonia   History of Present Illness:  65 year old male who presented to the ED 12/20 w/ cc: shortness of breath, and productive cough w/ hemoptysis. Per patient had COVID Oct 2022 and hasn't felt the same since.  Starting around end of Oct or beginning of Nov, he started to have cough which progressed and started coughing up phlegm.  This continued and roughly 2 weeks ago, he was seen by PCP and told that he had pneumonia (initially treated with Z-Pak and steroids with minimal improvement so then changed to different abx which he could not recall; however, could not tolerate 2nd round d/t Nausea).    In ED, CXR w/ RLL cavitary changes. Because of this CT chest obtained confirming dense RLL cavitary PNA. PCCM asked to eval.   He has had ongoing cough, initially green colored sputum then later started to turn pink colored.  No fevers/chills/sweats, myalgias, exposures to known sick contacts. 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 One of them is 64 years old and lives in the house and sleeps in the bedroom with him and his wife.  Pertinent  Medical History  Type 2 diabetes, COVID infection in October 2022, facial basal cell carcinoma, chronic heart murmur  Significant Hospital Events: Including procedures, antibiotic start and stop dates in addition to other pertinent events   12/20 admitted with cavitary pneumonia, placed on Zosyn and doxycycline  Interim History / Subjective:  Slept well, denies any significant complaints this morning  Objective   Blood pressure 108/65, pulse 77, temperature 98.1 F (36.7 C), temperature source Oral, resp. rate 17, height _0  (1.778 m), weight 70.7 kg, SpO2 96 %.        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

## 2021-03-15 NOTE — Progress Notes (Signed)
Pt off the unit to endo

## 2021-03-15 NOTE — Progress Notes (Signed)
OT Cancellation Note  Patient Details Name: Vincent [name] MRN: [id number] DOB: [birth year]   Cancelled Treatment:    Reason Eval/Treat Not Completed: Patient at procedure or test/ unavailable- pt off unit at procedure. Will follow and see as able.   Jolaine Artist, OT Acute Rehabilitation Services Pager [phone] Office [phone]   Delight Stare 03/15/2021, 11:10 AM

## 2021-03-15 NOTE — Progress Notes (Signed)
CXR reviewed  No pneumothorax

## 2021-03-15 NOTE — Evaluation (Signed)
Occupational Therapy Evaluation Patient Details Name: KRISTINA BERTONE MRN: [id number] DOB: 10/14/1955 Today's Date: 03/15/2021   History of Present Illness Pt is 65 yo male admitted on 03/13/21 with cavitary PNE.  Pt with COVID about 2 months ago and persistent symptoms.  Pt with other hx of DM2, HTN, and sciatica with multiple back surgeries   Clinical Impression   Pt is Mod I/Ind with ADLs/selfcare (impaired activity tolerance since Covid 2-3 months ago) and Ind with mobility using no AD. All education completed and no further acute OT services are indicated at this time. OT will sign off      Recommendations for follow up therapy are one component of a multi-disciplinary discharge planning process, led by the attending physician.  Recommendations may be updated based on patient status, additional functional criteria and insurance authorization.   Follow Up Recommendations  No OT follow up    Assistance Recommended at Discharge PRN  Functional Status Assessment     Equipment Recommendations  None recommended by OT    Recommendations for Other Services       Precautions / Restrictions Precautions Precautions: None Restrictions Weight Bearing Restrictions: No      Mobility Bed Mobility Overal bed mobility: Independent                  Transfers Overall transfer level: Independent                        Balance Overall balance assessment: No apparent balance deficits (not formally assessed)                                         ADL either performed or assessed with clinical judgement   ADL Overall ADL's : Modified independent;Independent;At baseline                                             Vision Baseline Vision/History: 1 Wears glasses Ability to See in Adequate Light: 0 Adequate Patient Visual Report: No change from baseline       Perception     Praxis      Pertinent Vitals/Pain Pain Assessment:  No/denies pain Pain Score: 0-No pain Faces Pain Scale: No hurt Pain Intervention(s): Monitored during session     Hand Dominance     Extremity/Trunk Assessment Upper Extremity Assessment Upper Extremity Assessment: Overall WFL for tasks assessed   Lower Extremity Assessment Lower Extremity Assessment: Defer to PT evaluation       Communication     Cognition Arousal/Alertness: Awake/alert Behavior During Therapy: WFL for tasks assessed/performed Overall Cognitive Status: Within Functional Limits for tasks assessed                                       General Comments       Exercises     Shoulder Instructions      Home Living Family/patient expects to be discharged to:: Private residence Living Arrangements: Spouse/significant other Available Help at Discharge: Family;Available PRN/intermittently Type of Home: House Home Access: Stairs to enter [employer] of Steps: 5-7 Entrance Stairs-Rails: Right;Left Home Layout: One level     Bathroom Shower/Tub: Tub/shower unit  Bathroom Toilet: Standard     Home Equipment: None          Prior Functioning/Environment               Mobility Comments: worked as Development worker, 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[place]; does still work around house, takes cares of ducks but reports fatgues easily and with lower endurance since COVID          OT Problem List: Decreased activity tolerance      OT Treatment/Interventions:      OT Goals(Current goals can be found in the care plan section) Acute Rehab OT Goals Patient Stated Goal: "go home by Christmas"  OT Frequency:     Barriers to D/C:            Co-evaluation              AM-PAC OT "6 Clicks" Daily Activity     Outcome Measure Help from another person eating meals?: None Help from another person taking care of personal grooming?: None Help from another person toileting, which includes using toliet, bedpan, or urinal?:  None Help from another person bathing (including washing, rinsing, drying)?: None Help from another person to put on and taking off regular upper body clothing?: None Help from another person to put on and taking off regular lower body clothing?: None 6 Click Score: 24   End of Session    Activity Tolerance: Patient limited by fatigue Patient left: in bed;with call bell/phone within reach                   Time: 1459-1515 OT Time Calculation (min): 16 min Charges:  OT General Charges $OT Visit: 1 Visit OT Evaluation $OT Eval Low Complexity: 1 Low    Britt Bottom 03/15/2021, 3:57 PM

## 2021-03-15 NOTE — Op Note (Signed)
[facility] Cardiopulmonary Patient Name: Vincent [name] Date: 03/15/2021 MRN: [id number] Attending MD: Laurin Coder MD, MD Date of Birth: October 17, 1955 CSN: Finalized Age: 65 Admit Type: Inpatient Gender: Male Procedure:             Bronchoscopy Indications:           Diagnostic bronchoalveolar lavage, Right lower lobe                         bacterial pneumonia Providers:             Jayliana Valencia A. Ander Slade MD, MD, Doristine Johns, RN,                         Luan Moore, Technician, Elmer Sow Referring MD:           Medicines:             See the Anesthesia note for documentation of the                         administered medications Complications:         No immediate complications Estimated Blood Loss:  Estimated blood loss was minimal. Procedure:             Pre-Anesthesia Assessment:                        - ASA Grade Assessment: II - A patient with mild                         systemic disease.                        After obtaining informed consent, the bronchoscope was                         passed under direct vision. Throughout the procedure,                         the patient's blood pressure, pulse, and oxygen                         saturations were monitored continuously. the BF-H190                         ([id number]) Olympus bronchoscope was introduced through                         the mouth, via the endotracheal tube (the patient was                         intubated for the procedure) and advanced to the                         tracheobronchial tree of both lungs. The procedure was                         accomplished without difficulty. The patient tolerated                         the procedure  well. fluoroscopy used Scope In: Scope Out: Findings:      Right Lung Abnormalities: Exudate was found in the right mainstem       bronchus.      Bronchoalveolar lavage was performed in the right lower lobe of the lung       and sent for  cell count and differential, routine cytology and       bacterial, AFB and fungal analysis. 120 mL of fluid were instilled. 40       mL were returned. The return was cloudy. There were no mucoid plugs in       the return fluid.      Transbronchial biopsies of an area of infiltration were performed in the       right lower lobe using forceps and sent for histopathology examination.       The procedure was guided by fluoroscopy. Biopsy of lung tissue was       obtained. Six biopsy passes were performed. Four biopsy samples were       obtained. Impression:            - Bronchoalveolar lavage                        - Right lower lobe bacterial pneumonia                        - Exudate was found in the right mainstem bronchus.                        - Bronchoalveolar lavage was performed.                        - Transbronchial lung biopsies were performed. Moderate Sedation:      An independent trained observer was present and continuously monitored       the patient. Recommendation:        - Await BAL and biopsy results. Procedure Code(s):     --- Professional ---                        [phone], Bronchoscopy, rigid or flexible, including                         fluoroscopic guidance, when performed; with                         transbronchial lung biopsy(s), single lobe                        32440, Bronchoscopy, rigid or flexible, including                         fluoroscopic guidance, when performed; with bronchial                         alveolar lavage Diagnosis Code(s):     --- Professional ---                        J15.9, Unspecified bacterial pneumonia                        R09.89, Other specified symptoms and signs involving  the circulatory and respiratory systems CPT copyright 2019 [employer]. All rights reserved. The codes documented in this report are preliminary and upon coder review may  be revised to meet current compliance  requirements. Sherrilyn Rist, MD Laurin Coder MD, MD 03/15/2021 11:52:36 AM This report has been signed electronically. Number of Addenda: 0

## 2021-03-15 NOTE — Anesthesia Postprocedure Evaluation (Signed)
Anesthesia Post Note  Patient: Vincent [name]  Procedure(s) Performed: VIDEO BRONCHOSCOPY WITH FLUORO BRONCHIAL WASHINGS BRONCHIAL BIOPSIES     Patient location during evaluation: PACU Anesthesia Type: General Level of consciousness: awake and alert, oriented and patient cooperative Pain management: pain level controlled Vital Signs Assessment: post-procedure vital signs reviewed and stable Respiratory status: spontaneous breathing, nonlabored ventilation and respiratory function stable Cardiovascular status: blood pressure returned to baseline and stable Postop Assessment: no apparent nausea or vomiting Anesthetic complications: no   No notable events documented.  Last Vitals:  Vitals:   03/15/21 1230 03/15/21 1256  BP: (!) 121/57 127/74  Pulse: (!) 107 (!) 51  Resp: 19 16  Temp:  36.7 C  SpO2: 94% 93%    Last Pain:  Vitals:   03/15/21 1256  TempSrc: Oral  PainSc:                  Pervis Hocking

## 2021-03-15 NOTE — Anesthesia Preprocedure Evaluation (Addendum)
Anesthesia Evaluation  Patient identified by MRN, date of birth, ID band Patient awake    Reviewed: Allergy & Precautions, NPO status , Patient's Chart, lab work & pertinent test results  Airway Mallampati: II  TM Distance: >3 FB Neck ROM: Full    Dental  (+) Poor Dentition, Dental Advisory Given,  Very poor dentition:   Pulmonary former smoker,  Quit smoking 1990   Pulmonary exam normal breath sounds clear to auscultation       Cardiovascular negative cardio ROS Normal cardiovascular exam Rhythm:Regular Rate:Normal     Neuro/Psych negative neurological ROS  negative psych ROS   GI/Hepatic negative GI ROS, Neg liver ROS,   Endo/Other  diabetes, Poorly Controlled, Type 2, Oral Hypoglycemic AgentsLast a1c 7.2, but FS have been high recently  Renal/GU negative Renal ROS  negative genitourinary   Musculoskeletal  (+) Arthritis , Osteoarthritis,    Abdominal   Peds  Hematology negative hematology ROS (+)   Anesthesia Other Findings Left face skin ca  Reproductive/Obstetrics negative OB ROS                             Anesthesia Physical  Anesthesia Plan  ASA: III  Anesthesia Plan: MAC   Post-op Pain Management:    Induction: Intravenous  PONV Risk Score and Plan: 1 and Ondansetron and Treatment may vary due to age or medical condition  Airway Management Planned: Nasal Cannula  Additional Equipment: None  Intra-op Plan:   Post-operative Plan:   Informed Consent: I have reviewed the patients History and Physical, chart, labs and discussed the procedure including the risks, benefits and alternatives for the proposed anesthesia with the patient or authorized representative who has indicated his/her understanding and acceptance.     Dental advisory given  Plan Discussed with: CRNA  Anesthesia Plan Comments:         Anesthesia Quick Evaluation

## 2021-03-15 NOTE — Anesthesia Procedure Notes (Signed)
Procedure Name: Intubation Date/Time: 03/15/2021 11:23 AM Performed by: Bryson Corona, CRNA Pre-anesthesia Checklist: Patient identified, Emergency Drugs available, Suction available and Patient being monitored Patient Re-evaluated:Patient Re-evaluated prior to induction Oxygen Delivery Method: Circle System Utilized Preoxygenation: Pre-oxygenation with 100% oxygen Induction Type: IV induction Ventilation: Mask ventilation without difficulty Laryngoscope Size: Mac and 4 Grade View: Grade I Tube type: Oral Tube size: 8.5 mm Number of attempts: 1 Airway Equipment and Method: Stylet Placement Confirmation: ETT inserted through vocal cords under direct vision, positive ETCO2 and breath sounds checked- equal and bilateral Secured at: 23 cm Tube secured with: Tape Dental Injury: Teeth and Oropharynx as per pre-operative assessment

## 2021-03-16 ENCOUNTER — Other Ambulatory Visit (HOSPITAL_COMMUNITY): Payer: Self-pay

## 2021-03-16 DIAGNOSIS — J189 Pneumonia, unspecified organism: Secondary | ICD-10-CM | POA: Diagnosis not present

## 2021-03-16 DIAGNOSIS — J984 Other disorders of lung: Secondary | ICD-10-CM | POA: Diagnosis not present

## 2021-03-16 LAB — CBC
HCT: 32.6 % — ABNORMAL LOW (ref 39.0–52.0)
Hemoglobin: 10.6 g/dL — ABNORMAL LOW (ref 13.0–17.0)
MCH: 27.5 pg (ref 26.0–34.0)
MCHC: 32.5 g/dL (ref 30.0–36.0)
MCV: 84.5 fL (ref 80.0–100.0)
Platelets: 164 10*3/uL (ref 150–400)
RBC: 3.86 MIL/uL — ABNORMAL LOW (ref 4.22–5.81)
RDW: 13.8 % (ref 11.5–15.5)
WBC: 7.1 10*3/uL (ref 4.0–10.5)
nRBC: 0 % (ref 0.0–0.2)

## 2021-03-16 LAB — BASIC METABOLIC PANEL
Anion gap: 8 (ref 5–15)
BUN: 28 mg/dL — ABNORMAL HIGH (ref 8–23)
CO2: 28 mmol/L (ref 22–32)
Calcium: 9.1 mg/dL (ref 8.9–10.3)
Chloride: 100 mmol/L (ref 98–111)
Creatinine, Ser: 1.63 mg/dL — ABNORMAL HIGH (ref 0.61–1.24)
GFR, Estimated: 46 mL/min — ABNORMAL LOW (ref >60)
Glucose, Bld: 166 mg/dL — ABNORMAL HIGH (ref 70–99)
Potassium: 4.8 mmol/L (ref 3.5–5.1)
Sodium: 136 mmol/L (ref 135–145)

## 2021-03-16 LAB — GLUCOSE, CAPILLARY: Glucose-Capillary: 146 mg/dL — ABNORMAL HIGH (ref 70–99)

## 2021-03-16 LAB — SURGICAL PATHOLOGY

## 2021-03-16 LAB — ACID FAST SMEAR (AFB, MYCOBACTERIA): Acid Fast Smear: NEGATIVE

## 2021-03-16 LAB — MAGNESIUM: Magnesium: 2.1 mg/dL (ref 1.7–2.4)

## 2021-03-16 LAB — PATHOLOGIST SMEAR REVIEW

## 2021-03-16 MED ORDER — DOXYCYCLINE HYCLATE 100 MG PO TABS
100.0000 mg | ORAL_TABLET | Freq: Two times a day (BID) | ORAL | 0 refills | Status: AC
Start: 2021-03-16 — End: 2021-03-26
  Filled 2021-03-16: qty 20, 10d supply, fill #0

## 2021-03-16 MED ORDER — INSULIN GLARGINE-YFGN 100 UNIT/ML ~~LOC~~ SOLN
15.0000 [IU] | Freq: Every day | SUBCUTANEOUS | Status: DC
  Filled 2021-03-16: qty 0.15

## 2021-03-16 MED ORDER — INSULIN ASPART 100 UNIT/ML IJ SOLN: 3.0000 [IU] | Freq: Three times a day (TID) | INTRAMUSCULAR | Status: DC

## 2021-03-16 MED ORDER — AMOXICILLIN-POT CLAVULANATE 875-125 MG PO TABS
1.0000 | ORAL_TABLET | Freq: Two times a day (BID) | ORAL | 0 refills | Status: AC
  Filled 2021-03-16: qty 20, 10d supply, fill #0

## 2021-03-16 NOTE — Progress Notes (Signed)
DISCHARGE NOTE HOME KAYLIB FURNESS to be discharged Home per MD order. Discussed prescriptions and follow up appointments with the patient. Prescriptions given to patient; medication list explained in detail. Patient verbalized understanding.  Skin clean, dry and intact without evidence of skin break down, no evidence of skin tears noted. IV catheter discontinued intact. Site without signs and symptoms of complications. Dressing and pressure applied. Pt denies pain at the site currently. No complaints noted.  Patient free of lines, drains, and wounds.   An After Visit Summary (AVS) was printed and given to the patient. Patient escorted via wheelchair, and discharged home via private auto.  Jessye Imhoff S Shellye Zandi, RN

## 2021-03-16 NOTE — Discharge Summary (Signed)
Physician Discharge Summary  Vincent [name] ACZ:[id number] DOB: [birth year] DOA: 03/13/2021  PCP: Leonard Downing, MD  Admit date: 03/13/2021 Discharge date: 03/16/2021  Admitted From: Home Disposition:  Home  Recommendations for Outpatient Follow-up:  Follow up with PCP in 1-2 weeks Please obtain BMP/CBC in one week your next doctors visit.  Doxycyline and Augmentin for 10 days Follow-up chest x-ray next 6-8 weeks with PCP or   Discharge Condition: Stable CODE STATUS: Full code Diet recommendation: Diabetic  Brief/Interim Summary: 65 year old with history of DM2, HTN, sciatica on narcotic presents with persistent productive cough and weakness.  Patient recovered from [name] about 2 months ago and was started on Z-Pak by his PCP for presumed pneumonia.  Due to persistence of his symptoms he comes to the ER, CT chest showed cavitary pneumonia in the right lower lobe.  He was started on Zosyn and pulmonary team was consulted.  Hypersensitivity panel was sent, pulmonary team plan for bronchoscopy.  Patient tolerated bronchoscopy on 03/15/2021.  Cultures were sent.  He was cleared by pulmonary for discharge on oral Augmentin and doxycycline For total of 10 days. Patient feels much better wants to go home.  Stable for discharge.     Assessment & Plan:   Principal Problem:   PNA (pneumonia) Active Problems:   Pneumonia with cavity of lung   Right lower lobe cavity pneumonia with concerning abscess -Status post bronchoscopy/tolerated this well.  Cleared for discharge by pulmonary doxycycline for 10 days. -Bronchoalveolar lavage-GPC, WBC - Speech and swallow evaluation-did well. - Supportive care, as needed bronchodilators, I-S/flutter valve.   AKI; Baseline 1.0. -Gentle hydration Cr now 1.74 > 1.64   Hyponatremia - Resolved with fluids   IIDM; A1c 8.6. Uncontrolled due to hyperglycemia -Resume his home regimen  Sciatica -Continue home meds   Hyperlipidemia - Zocor     Body mass index is 22.36 kg/m.         Discharge Diagnoses:  Principal Problem:   PNA (pneumonia) Active Problems:   Pneumonia with cavity of lung      Consultations: Pulmonary  Subjective:Wants to go home.  No complaints  Discharge Exam: Vitals:   03/15/21 2127 03/16/21 0647  BP: 133/65 106/69  Pulse: 93 93  Resp: 18 15  Temp: (!) 97.3 F (36.3 C) 98 F (36.7 C)  SpO2: 97% 94%   Vitals:   03/15/21 1256 03/15/21 1729 03/15/21 2127 03/16/21 0647  BP: 127/74 100/72 133/65 106/69  Pulse: (!) 51 95 93 93  Resp: _0 Temp: 98.1 F (36.7 C) 97.6 F (36.4 C) (!) 97.3 F (36.3 C) 98 F (36.7 C)  TempSrc: Oral  Oral Oral  SpO2: 93% 96% 97% 94%  Weight:      Height:        General: Pt is alert, awake, not in acute distress Cardiovascular: RRR, S1/S2 +, no rubs, no gallops Respiratory: CTA bilaterally, no wheezing, no rhonchi Abdominal: Soft, NT, ND, bowel sounds + Extremities: no edema, no cyanosis  Discharge Instructions   Allergies as of 03/16/2021   No Known Allergies      Medication List     STOP taking these medications    cephALEXin 500 MG capsule Commonly known as: Keflex       TAKE these medications    amoxicillin-clavulanate 875-125 MG tablet Commonly known as: Augmentin Take 1 tablet by mouth every 12 (twelve) hours for 10 days.   B-12 DOTS PO Take 1 tablet by mouth daily.  blood glucose meter kit and supplies Dispense based on patient and insurance preference. Use up to four times daily as directed. (FOR ICD-10 E11.8).   diclofenac 75 MG EC tablet Commonly known as: VOLTAREN Take 75 mg by mouth 2 (two) times daily.   diclofenac Sodium 1 % Gel Commonly known as: VOLTAREN Apply 1 application topically daily as needed (back pain).   doxycycline 100 MG tablet Commonly known as: VIBRA-TABS Take 1 tablet (100 mg total) by mouth 2 (two) times daily for 10 days.   GABAPENTIN PO Take 1 tablet by mouth daily.    HYDROcodone-acetaminophen 7.5-325 MG tablet Commonly known as: Norco Take 1 tablet by mouth every 6 (six) hours as needed for moderate pain.   metFORMIN 1000 MG tablet Commonly known as: GLUCOPHAGE Take 500 mg by mouth 2 (two) times daily with a meal.   Oxycodone HCl 10 MG Tabs Take 10 mg by mouth every 4 (four) hours as needed (pain).   promethazine 25 MG suppository Commonly known as: PHENERGAN Place 1 suppository (25 mg total) rectally every 6 (six) hours as needed for nausea or vomiting.   simvastatin 20 MG tablet Commonly known as: ZOCOR Take 20 mg by mouth every evening.   VITAMIN D PO Take 1 tablet by mouth daily.        Follow-up Information     Leonard Downing, MD Follow up in 1 month(s).   Specialty: Family Medicine Contact information: [place] [address] [phone]                No Known Allergies  You were cared for by a hospitalist during your hospital stay. If you have any questions about your discharge medications or the care you received while you were in the hospital after you are discharged, you can call the unit and asked to speak with the hospitalist on call if the hospitalist that took care of you is not available. Once you are discharged, your primary care physician will handle any further medical issues. Please note that no refills for any discharge medications will be authorized once you are discharged, as it is imperative that you return to your primary care physician (or establish a relationship with a primary care physician if you do not have one) for your aftercare needs so that they can reassess your need for medications and monitor your lab values.   Procedures/Studies: DG Chest 2 View  Result Date: 03/13/2021 CLINICAL DATA:  Cough, recent pneumonia. EXAM: CHEST - 2 VIEW COMPARISON:  02/11/2006. FINDINGS: The heart size and mediastinal contours are within normal limits. There is consolidation in the  right lower lobe with a region of air-fluid levels, suspected for cavitary lesion. The possibility of a small right pleural effusion can not be excluded. The left lung is clear. No pneumothorax is seen. No acute osseous abnormality. IMPRESSION: Consolidation with findings suggestive cavitary lesion with air-fluid levels in the right lower lobe. While findings may be infectious or inflammatory, the possibility of underlying neoplastic process can not be excluded. CT is suggested for further evaluation. Electronically Signed   By: Brett Fairy M.D.   On: 03/13/2021 02:01   CT Chest W Contrast  Result Date: 03/13/2021 CLINICAL DATA:  Abnormal chest x-ray; recent pneumonia EXAM: CT CHEST WITH CONTRAST TECHNIQUE: Multidetector CT imaging of the chest was performed during intravenous contrast administration. CONTRAST:  64m OMNIPAQUE IOHEXOL 300 MG/ML  SOLN COMPARISON:  None. FINDINGS: Cardiovascular: Normal heart size. No pericardial effusion. Thoracic  aorta is normal in caliber. Coronary artery calcification. Mediastinum/Nodes: Mildly enlarged right hilar node. No mediastinal adenopathy. Lungs/Pleura: No pleural effusion or pneumothorax. Right lower lobe consolidation abutting the major fissure. There are 2 adjacent and possibly communicating areas of cavitation with air-fluid levels. Additional patchy ground-glass density and nodular opacities in both lungs. Upper Abdomen: No acute abnormality. Musculoskeletal: No acute osseous abnormality. IMPRESSION: Right lower lobe consolidation with adjacent and possibly communicating areas of cavitation likely representing pneumonia with abscess formation. Additional patchy ground-glass density and nodular opacities in both lungs reflect multifocal pneumonia. Electronically Signed   By: Macy Mis M.D.   On: 03/13/2021 15:15   DG CHEST PORT 1 VIEW  Result Date: 03/15/2021 CLINICAL DATA:  Status post bronchoscopy. EXAM: PORTABLE CHEST 1 VIEW COMPARISON:  CT chest  and chest x-ray dated March 13, 2021. FINDINGS: The heart size and mediastinal contours are within normal limits. Right lower lobe consolidation again noted, with resolved air-fluid level. No pleural effusion or pneumothorax. No acute osseous abnormality. IMPRESSION: 1. No pneumothorax. 2. Continued right lower lobe pneumonia with resolved air-fluid level. Electronically Signed   By: Titus Dubin M.D.   On: 03/15/2021 12:35   DG C-ARM BRONCHOSCOPY  Result Date: 03/15/2021 C-ARM BRONCHOSCOPY: Fluoroscopy was utilized by the requesting physician.  No radiographic interpretation.     The results of significant diagnostics from this hospitalization (including imaging, microbiology, ancillary and laboratory) are listed below for reference.     Microbiology: Recent Results (from the past 240 hour(s))  Resp Panel by RT-PCR (Flu A&B, Covid) Nasopharyngeal Swab     Status: None   Collection Time: 03/13/21  1:10 AM   Specimen: Nasopharyngeal Swab; Nasopharyngeal(NP) swabs in vial transport medium  Result Value Ref Range Status   SARS Coronavirus 2 by RT PCR NEGATIVE NEGATIVE Final    Comment: (NOTE) SARS-CoV-2 target nucleic acids are NOT DETECTED.  The SARS-CoV-2 RNA is generally detectable in upper respiratory specimens during the acute phase of infection. The lowest concentration of SARS-CoV-2 viral copies this assay can detect is 138 copies/mL. A negative result does not preclude SARS-Cov-2 infection and should not be used as the sole basis for treatment or other patient management decisions. A negative result may occur with  improper specimen collection/handling, submission of specimen other than nasopharyngeal swab, presence of viral mutation(s) within the areas targeted by this assay, and inadequate number of viral copies(<138 copies/mL). A negative result must be combined with clinical observations, patient history, and epidemiological information. The expected result is  Negative.  Fact Sheet for Patients:  EntrepreneurPulse.com.au  Fact Sheet for Healthcare Providers:  IncredibleEmployment.be  This test is no t yet approved or cleared by the [place] FDA and  has been authorized for detection and/or diagnosis of SARS-CoV-2 by FDA under an Emergency Use Authorization (EUA). This EUA will remain  in effect (meaning this test can be used) for the duration of the COVID-19 declaration under Section 564(b)(1) of the Act, 21 U.S.C.section 360bbb-3(b)(1), unless the authorization is terminated  or revoked sooner.       Influenza A by PCR NEGATIVE NEGATIVE Final   Influenza B by PCR NEGATIVE NEGATIVE Final    Comment: (NOTE) The Xpert Xpress SARS-CoV-2/FLU/RSV plus assay is intended as an aid in the diagnosis of influenza from Nasopharyngeal swab specimens and should not be used as a sole basis for treatment. Nasal washings and aspirates are unacceptable for Xpert Xpress SARS-CoV-2/FLU/RSV testing.  Fact Sheet for Patients: EntrepreneurPulse.com.au  Fact Sheet for Healthcare  Providers: IncredibleEmployment.be  This test is not yet approved or cleared by the [place] and has been authorized for detection and/or diagnosis of SARS-CoV-2 by FDA under an Emergency Use Authorization (EUA). This EUA will remain in effect (meaning this test can be used) for the duration of the COVID-19 declaration under Section 564(b)(1) of the Act, 21 U.S.C. section 360bbb-3(b)(1), unless the authorization is terminated or revoked.  Performed at [facility], [place] [address] [place], [place] [address]   Blood culture (routine x 2)     Status: None (Preliminary result)   Collection Time: 03/13/21  1:15 AM   Specimen: BLOOD  Result Value Ref Range Status   Specimen Description BLOOD RIGHT ARM  Final   Special Requests   Final    BOTTLES DRAWN AEROBIC AND ANAEROBIC  Blood Culture adequate volume   Culture   Final    NO GROWTH 3 DAYS Performed at [facility], [place] [address] [place], [place] [address]    Report Status PENDING  Incomplete  Blood culture (routine x 2)     Status: None (Preliminary result)   Collection Time: 03/13/21  1:32 AM   Specimen: BLOOD  Result Value Ref Range Status   Specimen Description BLOOD LEFT ARM  Final   Special Requests   Final    BOTTLES DRAWN AEROBIC ONLY Blood Culture adequate volume   Culture   Final    NO GROWTH 3 DAYS Performed at [facility], [address] [place], [place] [address]    Report Status PENDING  Incomplete  MRSA Next Gen by PCR, Nasal     Status: None   Collection Time: 03/13/21  4:09 PM   Specimen: Nasal Mucosa; Nasal Swab  Result Value Ref Range Status   MRSA by PCR Next Gen NOT DETECTED NOT DETECTED Final    Comment: (NOTE) The GeneXpert MRSA Assay (FDA approved for NASAL specimens only), is one component of a comprehensive MRSA colonization surveillance program. It is not intended to diagnose MRSA infection nor to guide or monitor treatment for MRSA infections. Test performance is not FDA approved in patients less than 12 years old. Performed at [facility], [place] [address], [place], [place] [address]   Expectorated Sputum Assessment w Gram Stain, Rflx to Resp Cult     Status: None   Collection Time: 03/14/21 11:28 PM   Specimen: Expectorated Sputum  Result Value Ref Range Status   Specimen Description EXPECTORATED SPUTUM  Final   Special Requests NONE  Final   Sputum evaluation   Final    THIS SPECIMEN IS ACCEPTABLE FOR SPUTUM CULTURE Performed at [facility], [place] [address] [place], [place] [address]    Report Status 03/15/2021 FINAL  Final  Culture, Respiratory w Gram Stain     Status: None (Preliminary result)   Collection Time: 03/14/21 11:28 PM  Result Value Ref Range Status   Specimen Description EXPECTORATED SPUTUM  Final   Special Requests  NONE Reflexed from [id number]  Final   Gram Stain   Final    MODERATE WBC PRESENT,BOTH PMN AND MONONUCLEAR FEW GRAM POSITIVE COCCI RARE GRAM POSITIVE RODS RARE YEAST Performed at [facility], [place] [address] [place], [place] [address]    Culture PENDING  Incomplete   Report Status PENDING  Incomplete  Culture, BAL-quantitative w Gram Stain     Status: None (Preliminary result)   Collection Time: 03/15/21 11:22 AM   Specimen:  Bronchial Alveolar Lavage; Respiratory  Result Value Ref Range Status   Specimen Description BRONCHIAL ALVEOLAR LAVAGE  Final   Special Requests RLL BAL SPEC A  Final   Gram Stain   Final    ABUNDANT WBC PRESENT,BOTH PMN AND MONONUCLEAR RARE GRAM POSITIVE COCCI IN CHAINS Performed at [facility], [place] [address], [place], [place] [address]    Culture PENDING  Incomplete   Report Status PENDING  Incomplete  Anaerobic culture w Gram Stain     Status: None (Preliminary result)   Collection Time: 03/15/21 11:22 AM   Specimen: Bronchoalveolar Lavage  Result Value Ref Range Status   Specimen Description BRONCHIAL ALVEOLAR LAVAGE  Final   Special Requests RLL BAL SPEC A  Final   Gram Stain   Final    ABUNDANT WBC PRESENT,BOTH PMN AND MONONUCLEAR FEW GRAM POSITIVE COCCI Performed at [facility], [place] [address], [place], [place] [address]    Culture PENDING  Incomplete   Report Status PENDING  Incomplete     Labs: BNP (last 3 results) No results for input(s): BNP in the last 8760 hours. Basic Metabolic Panel: Recent Labs  Lab 03/13/21 0135 03/14/21 0453 03/15/21 0717 03/16/21 0521  NA 131* 132* 134* 136  K 3.9 4.6 4.7 4.8  CL 96* 97* 101 100  CO2 _0 GLUCOSE 228* 164* 214* 166*  BUN _1 28*  CREATININE 1.55* 1.74* 1.65* 1.63*  CALCIUM 9.3 8.8* 9.1 9.1  MG  --   --  2.2 2.1   Liver Function Tests: Recent Labs  Lab 03/13/21 0135  AST 12*  ALT 17  ALKPHOS 178*  BILITOT 1.6*  PROT 6.9  ALBUMIN 2.3*   No results  for input(s): LIPASE, AMYLASE in the last 168 hours. No results for input(s): AMMONIA in the last 168 hours. CBC: Recent Labs  Lab 03/13/21 0135 03/14/21 0453 03/15/21 0717 03/16/21 0521  WBC 8.9 7.2 7.2 7.1  NEUTROABS 7.2  --   --   --   HGB 10.5* 9.4* 9.6* 10.6*  HCT 32.7* 28.3* 29.9* 32.6*  MCV 85.2 84.0 84.2 84.5  PLT 158 137* 144* 164   Cardiac Enzymes: No results for input(s): CKTOTAL, CKMB, CKMBINDEX, TROPONINI in the last 168 hours. BNP: Invalid input(s): POCBNP CBG: Recent Labs  Lab 03/15/21 0957 03/15/21 1304 03/15/21 1624 03/15/21 2126 03/16/21 0648  GLUCAP 176* 175* 334* 207* 146*   D-Dimer No results for input(s): DDIMER in the last 72 hours. Hgb A1c Recent Labs    03/13/21 1932  HGBA1C 8.6*   Lipid Profile No results for input(s): CHOL, HDL, LDLCALC, TRIG, CHOLHDL, LDLDIRECT in the last 72 hours. Thyroid function studies No results for input(s): TSH, T4TOTAL, T3FREE, THYROIDAB in the last 72 hours.  Invalid input(s): FREET3 Anemia work up No results for input(s): VITAMINB12, FOLATE, FERRITIN, TIBC, IRON, RETICCTPCT in the last 72 hours. Urinalysis    Component Value Date/Time   COLORURINE YELLOW 10/27/2016 2040   APPEARANCEUR CLEAR 10/27/2016 2040   LABSPEC 1.010 10/27/2016 2040   PHURINE 5.5 10/27/2016 2040   GLUCOSEU 500 (A) 10/27/2016 2040   HGBUR NEGATIVE 10/27/2016 2040   BILIRUBINUR NEGATIVE 10/27/2016 2040   Equality NEGATIVE 10/27/2016 2040   PROTEINUR NEGATIVE 10/27/2016 2040   NITRITE NEGATIVE 10/27/2016 2040   LEUKOCYTESUR NEGATIVE 10/27/2016 2040   Sepsis Labs Invalid input(s): PROCALCITONIN,  WBC,  LACTICIDVEN Microbiology Recent Results (from the past 240 hour(s))  Resp Panel by RT-PCR (Flu  A&B, Covid) Nasopharyngeal Swab     Status: None   Collection Time: 03/13/21  1:10 AM   Specimen: Nasopharyngeal Swab; Nasopharyngeal(NP) swabs in vial transport medium  Result Value Ref Range Status   SARS Coronavirus 2 by RT PCR  NEGATIVE NEGATIVE Final    Comment: (NOTE) SARS-CoV-2 target nucleic acids are NOT DETECTED.  The SARS-CoV-2 RNA is generally detectable in upper respiratory specimens during the acute phase of infection. The lowest concentration of SARS-CoV-2 viral copies this assay can detect is 138 copies/mL. A negative result does not preclude SARS-Cov-2 infection and should not be used as the sole basis for treatment or other patient management decisions. A negative result may occur with  improper specimen collection/handling, submission of specimen other than nasopharyngeal swab, presence of viral mutation(s) within the areas targeted by this assay, and inadequate number of viral copies(<138 copies/mL). A negative result must be combined with clinical observations, patient history, and epidemiological information. The expected result is Negative.  Fact Sheet for Patients:  EntrepreneurPulse.com.au  Fact Sheet for Healthcare Providers:  IncredibleEmployment.be  This test is no t yet approved or cleared by the [place] FDA and  has been authorized for detection and/or diagnosis of SARS-CoV-2 by FDA under an Emergency Use Authorization (EUA). This EUA will remain  in effect (meaning this test can be used) for the duration of the COVID-19 declaration under Section 564(b)(1) of the Act, 21 U.S.C.section 360bbb-3(b)(1), unless the authorization is terminated  or revoked sooner.       Influenza A by PCR NEGATIVE NEGATIVE Final   Influenza B by PCR NEGATIVE NEGATIVE Final    Comment: (NOTE) The Xpert Xpress SARS-CoV-2/FLU/RSV plus assay is intended as an aid in the diagnosis of influenza from Nasopharyngeal swab specimens and should not be used as a sole basis for treatment. Nasal washings and aspirates are unacceptable for Xpert Xpress SARS-CoV-2/FLU/RSV testing.  Fact Sheet for Patients: EntrepreneurPulse.com.au  Fact Sheet for  Healthcare Providers: IncredibleEmployment.be  This test is not yet approved or cleared by the [place] FDA and has been authorized for detection and/or diagnosis of SARS-CoV-2 by FDA under an Emergency Use Authorization (EUA). This EUA will remain in effect (meaning this test can be used) for the duration of the COVID-19 declaration under Section 564(b)(1) of the Act, 21 U.S.C. section 360bbb-3(b)(1), unless the authorization is terminated or revoked.  Performed at [facility], [place] [address], [place], [place] [address]   Blood culture (routine x 2)     Status: None (Preliminary result)   Collection Time: 03/13/21  1:15 AM   Specimen: BLOOD  Result Value Ref Range Status   Specimen Description BLOOD RIGHT ARM  Final   Special Requests   Final    BOTTLES DRAWN AEROBIC AND ANAEROBIC Blood Culture adequate volume   Culture   Final    NO GROWTH 3 DAYS Performed at [facility], [place] [address], [place], [place] [address]    Report Status PENDING  Incomplete  Blood culture (routine x 2)     Status: None (Preliminary result)   Collection Time: 03/13/21  1:32 AM   Specimen: BLOOD  Result Value Ref Range Status   Specimen Description BLOOD LEFT ARM  Final   Special Requests   Final    BOTTLES DRAWN AEROBIC ONLY Blood Culture adequate volume   Culture   Final    NO GROWTH 3 DAYS Performed at [facility], [address] [place], [place] [address]  Report Status PENDING  Incomplete  MRSA Next Gen by PCR, Nasal     Status: None   Collection Time: 03/13/21  4:09 PM   Specimen: Nasal Mucosa; Nasal Swab  Result Value Ref Range Status   MRSA by PCR Next Gen NOT DETECTED NOT DETECTED Final    Comment: (NOTE) The GeneXpert MRSA Assay (FDA approved for NASAL specimens only), is one component of a comprehensive MRSA colonization surveillance program. It is not intended to diagnose MRSA infection nor to guide or monitor treatment  for MRSA infections. Test performance is not FDA approved in patients less than 49 years old. Performed at [facility], [place] [address] [place], [place] [address]   Expectorated Sputum Assessment w Gram Stain, Rflx to Resp Cult     Status: None   Collection Time: 03/14/21 11:28 PM   Specimen: Expectorated Sputum  Result Value Ref Range Status   Specimen Description EXPECTORATED SPUTUM  Final   Special Requests NONE  Final   Sputum evaluation   Final    THIS SPECIMEN IS ACCEPTABLE FOR SPUTUM CULTURE Performed at [facility], [place] [address] [place], [place] [address]    Report Status 03/15/2021 FINAL  Final  Culture, Respiratory w Gram Stain     Status: None (Preliminary result)   Collection Time: 03/14/21 11:28 PM  Result Value Ref Range Status   Specimen Description EXPECTORATED SPUTUM  Final   Special Requests NONE Reflexed from [id number]  Final   Gram Stain   Final    MODERATE WBC PRESENT,BOTH PMN AND MONONUCLEAR FEW GRAM POSITIVE COCCI RARE GRAM POSITIVE RODS RARE YEAST Performed at [facility], [place] [address] [place], [place] [address]    Culture PENDING  Incomplete   Report Status PENDING  Incomplete  Culture, BAL-quantitative w Gram Stain     Status: None (Preliminary result)   Collection Time: 03/15/21 11:22 AM   Specimen: Bronchial Alveolar Lavage; Respiratory  Result Value Ref Range Status   Specimen Description BRONCHIAL ALVEOLAR LAVAGE  Final   Special Requests RLL BAL SPEC A  Final   Gram Stain   Final    ABUNDANT WBC PRESENT,BOTH PMN AND MONONUCLEAR RARE GRAM POSITIVE COCCI IN CHAINS Performed at [facility], [place] [address], [place], [place] [address]    Culture PENDING  Incomplete   Report Status PENDING  Incomplete  Anaerobic culture w Gram Stain     Status: None (Preliminary result)   Collection Time: 03/15/21 11:22 AM   Specimen: Bronchoalveolar Lavage  Result Value Ref Range Status   Specimen Description BRONCHIAL ALVEOLAR  LAVAGE  Final   Special Requests RLL BAL SPEC A  Final   Gram Stain   Final    ABUNDANT WBC PRESENT,BOTH PMN AND MONONUCLEAR FEW GRAM POSITIVE COCCI Performed at [facility], [place] [address], [place], [place] [address]    Culture PENDING  Incomplete   Report Status PENDING  Incomplete     Time coordinating discharge:  I have spent 35 minutes face to face with the patient and on the ward discussing the patients care, assessment, plan and disposition with other care givers. >50% of the time was devoted counseling the patient about the risks and benefits of treatment/Discharge disposition and coordinating care.   SIGNED:   Damita Lack, MD  [facility] 03/16/2021, 11:44 AM   If 7PM-7AM, please contact night-coverage

## 2021-03-16 NOTE — Care Management Important Message (Signed)
Important Message  Patient Details  Name: Vincent [name] MRN: [id number] Date of Birth: 07-26-1955   Medicare Important Message Given:  Yes     Orbie Pyo 03/16/2021, 3:27 PM

## 2021-03-16 NOTE — Progress Notes (Signed)
° °  NAME:  Vincent [name], MRN:  [id number], DOB:  [birth year], LOS: 3 ADMISSION DATE:  03/13/2021, CONSULTATION DATE:  03/14/21 REFERRING MD:  Dr Roosevelt Locks, CHIEF COMPLAINT:  cavitary pneumonia   History of Present Illness:  65 year old male who presented to the ED 12/20 w/ cc: shortness of breath, and productive cough w/ hemoptysis. Per patient had COVID Oct 2022 and hasn't felt the same since.  Starting around end of Oct or beginning of Nov, he started to have cough which progressed and started coughing up phlegm.  This continued and roughly 2 weeks ago, he was seen by PCP and told that he had pneumonia (initially treated with Z-Pak and steroids with minimal improvement so then changed to different abx which he could not recall; however, could not tolerate 2nd round d/t Nausea).    In ED, CXR w/ RLL cavitary changes. Because of this CT chest obtained confirming dense RLL cavitary PNA. PCCM asked to eval.   He has had ongoing cough, initially green colored sputum then later started to turn pink colored.  No fevers/chills/sweats, myalgias, exposures to known sick contacts. 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 One of them is 50 years old and lives in the house and sleeps in the bedroom with him and his wife.  Pertinent  Medical History  Type 2 diabetes, COVID infection in October 2022, facial basal cell carcinoma, chronic heart murmur  Significant Hospital Events: Including procedures, antibiotic start and stop dates in addition to other pertinent events   12/20 admitted with cavitary pneumonia, placed on Zosyn and doxycycline 12/22 bronchoscopy  Interim History / Subjective:  Slept well, denies any significant complaints this morning  Objective   Blood pressure 106/69, pulse 93, temperature 98 F (36.7 C), temperature source Oral, resp. rate 15, height _0  (1.778 m), weight 70.7 kg, SpO2 94 %.        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

## 2021-03-16 NOTE — TOC Transition Note (Signed)
Transition of Care [facility]) - CM/SW Discharge Note   Patient Details  Name: ABDULLAH [name] MRN: [id number] Date of Birth: Apr 06, 1955  Transition [facility] CM/SW Contact:  Tom-Johnson, Renea Ee, RN Phone Number: 03/16/2021, 10:29 AM   Clinical Narrative:    Patient is scheduled for discharge home today. No followup by PT/OT. Denies any needs. Wife to transport at discharge. No further TOC needs noted.   Final next level of care: Home/Self Care Barriers to Discharge: Barriers Resolved   Patient Goals and CMS Choice Patient states their goals for this hospitalization and ongoing recovery are:: To go home CMS Medicare.gov Compare Post Acute Care list provided to:: Patient Choice offered to / list presented to : NA  Discharge Placement                       Discharge Plan and Services   Discharge Planning Services: CM Consult            DME Arranged: N/A DME Agency: NA       HH Arranged: NA HH Agency: NA        Social Determinants of Health (SDOH) Interventions     Readmission Risk Interventions No flowsheet data found.

## 2021-03-17 LAB — CULTURE, RESPIRATORY W GRAM STAIN: Culture: NORMAL

## 2021-03-18 LAB — CULTURE, BLOOD (ROUTINE X 2)
Culture: NO GROWTH
Culture: NO GROWTH
Special Requests: ADEQUATE
Special Requests: ADEQUATE

## 2021-03-18 LAB — CULTURE, BAL-QUANTITATIVE W GRAM STAIN: Culture: >=100000 — AB

## 2021-03-19 ENCOUNTER — Encounter (HOSPITAL_COMMUNITY): Payer: Self-pay | Admitting: Pulmonary Disease

## 2021-03-20 LAB — ANAEROBIC CULTURE W GRAM STAIN

## 2021-03-21 LAB — HYPERSENSITIVITY PNEUMONITIS
A. Pullulans Abs: NEGATIVE
A.Fumigatus #1 Abs: NEGATIVE
Micropolyspora faeni, IgG: NEGATIVE
Pigeon Serum Abs: NEGATIVE
Thermoact. Saccharii: NEGATIVE
Thermoactinomyces vulgaris, IgG: NEGATIVE

## 2021-04-16 LAB — FUNGUS CULTURE RESULT

## 2021-04-16 LAB — FUNGAL ORGANISM REFLEX

## 2021-04-16 LAB — FUNGUS CULTURE WITH STAIN

## 2021-04-23 ENCOUNTER — Other Ambulatory Visit: Payer: Self-pay

## 2021-04-23 ENCOUNTER — Encounter: Payer: Self-pay | Admitting: Nurse Practitioner

## 2021-04-23 ENCOUNTER — Ambulatory Visit (INDEPENDENT_AMBULATORY_CARE_PROVIDER_SITE_OTHER): Payer: Medicare Other | Admitting: Nurse Practitioner

## 2021-04-23 DIAGNOSIS — G9332 Myalgic encephalomyelitis/chronic fatigue syndrome: Secondary | ICD-10-CM

## 2021-04-23 DIAGNOSIS — U071 COVID-19: Secondary | ICD-10-CM

## 2021-04-23 DIAGNOSIS — U099 Post covid-19 condition, unspecified: Secondary | ICD-10-CM

## 2021-04-23 DIAGNOSIS — J1282 Pneumonia due to coronavirus disease 2019: Secondary | ICD-10-CM

## 2021-04-23 DIAGNOSIS — Z8616 Personal history of COVID-19: Secondary | ICD-10-CM

## 2021-04-23 MED ORDER — ONDANSETRON 4 MG PO TBDP: 4.0000 mg | ORAL_TABLET | Freq: Three times a day (TID) | ORAL | 0 refills | Status: DC | PRN

## 2021-04-23 MED ORDER — PREDNISONE 10 MG PO TABS: ORAL_TABLET | ORAL | 0 refills | Status: DC

## 2021-04-23 NOTE — Patient Instructions (Signed)
History of Covid Decreased appetite Fatigue Wheezing Pneumonia Nausea:   Stay well hydrated  Stay active  Deep breathing exercises  May take tylenol or fever or pain  May take mucinex DM twice daily  Will order prednisone  Will order zofran ODT  Will place referral to pulmonary   Follow up:  Follow up in 2 weeks or sooner if needed

## 2021-04-23 NOTE — Progress Notes (Signed)
Virtual Visit via Telephone Note  I connected with Vincent [name] on 04/23/21 at 10:40 AM EST by telephone and verified that I am speaking with the correct person using two identifiers.  Location: Patient: home Provider: office   I discussed the limitations, risks, security and privacy concerns of performing an evaluation and management service by telephone and the availability of in person appointments. I also discussed with the patient that there may be a patient responsible charge related to this service. The patient expressed understanding and agreed to proceed.  Recent Significant events:  Hospital admission 03/13/21:  Brief/Interim Summary: 66 year old with history of DM2, HTN, sciatica on narcotic presents with persistent productive cough and weakness.  Patient recovered from [place] about 2 months ago and was started on Z-Pak by his PCP for presumed pneumonia.  Due to persistence of his symptoms he comes to the ER, CT chest showed cavitary pneumonia in the right lower lobe.  He was started on Zosyn and pulmonary team was consulted.  Hypersensitivity panel was sent, pulmonary team plan for bronchoscopy.  Patient tolerated bronchoscopy on 03/15/2021.  Cultures were sent.  He was cleared by pulmonary for discharge on oral Augmentin and doxycycline For total of 10 days. Patient feels much better wants to go home.  Stable for discharge.     Assessment & Plan:   Principal Problem:   PNA (pneumonia) Active Problems:   Pneumonia with cavity of lung   Right lower lobe cavity pneumonia with concerning abscess -Status post bronchoscopy/tolerated this well.  Cleared for discharge by pulmonary doxycycline for 10 days. -Bronchoalveolar lavage-GPC, WBC - Speech and swallow evaluation-did well. - Supportive care, as needed bronchodilators, I-S/flutter valve.    History of Present Illness:  Patient presents today for post-COVID care clinic visit.  Patient was diagnosed with COVID on December 27, 2020.  He did develop pneumonia and went through several rounds of antibiotics before being admitted to the hospital on 03/13/2021.-(See hospital note above).  Patient states that he has been doing fairly well since hospital discharge.  He was discharged home on Augmentin and doxycycline.  He states that he was unable to tolerate this medication so he stopped them without telling anyone.  Patient states that over the past couple days he has started wheezing again.  We discussed that he probably needs a pulmonary consult at this point.  He did have bronchoscopy performed by lobe our pulmonary while hospitalized.  Patient has been having ongoing fatigue since having COVID.  We discussed that he may need a physical therapy referral.  Patient also complains of recent nausea and vomiting.  He thinks he may have picked up a stomach virus.  Order Zofran for this.  Denies f/c/s, n/v/d, hemoptysis, PND, chest pain or edema.     Observations/Objective:  Vitals with BMI 03/16/2021 03/15/2021 03/15/2021  Height - - -  Weight - - -  BMI - - -  Systolic 423 536 144  Diastolic 69 65 72  Pulse 93 93 95      Assessment and Plan:  History of Covid Decreased appetite Fatigue Wheezing Pneumonia Nausea:   Stay well hydrated  Stay active  Deep breathing exercises  May take tylenol or fever or pain  May take mucinex DM twice daily  Will order prednisone  Will order zofran ODT  Will place referral to pulmonary   Follow up:  Follow up in 2 weeks or sooner if needed      I discussed the assessment and treatment  plan with the patient. The patient was provided an opportunity to ask questions and all were answered. The patient agreed with the plan and demonstrated an understanding of the instructions.   The patient was advised to call back or seek an in-person evaluation if the symptoms worsen or if the condition fails to improve as anticipated.  I provided 23 minutes of non-face-to-face  time during this encounter.   Fenton Foy, NP

## 2021-04-30 LAB — ACID FAST CULTURE WITH REFLEXED SENSITIVITIES (MYCOBACTERIA): Acid Fast Culture: NEGATIVE

## 2021-05-10 ENCOUNTER — Institutional Professional Consult (permissible substitution): Payer: Medicare Other | Admitting: Pulmonary Disease

## 2021-05-18 ENCOUNTER — Other Ambulatory Visit: Payer: Self-pay

## 2021-05-18 ENCOUNTER — Encounter: Payer: Self-pay | Admitting: Pulmonary Disease

## 2021-05-18 ENCOUNTER — Ambulatory Visit (INDEPENDENT_AMBULATORY_CARE_PROVIDER_SITE_OTHER): Payer: Medicare Other | Admitting: Pulmonary Disease

## 2021-05-18 VITALS — BP 130/60 | HR 96 | Temp 98.2°F | Ht 70.0 in | Wt 162.4 lb

## 2021-05-18 DIAGNOSIS — R0602 Shortness of breath: Secondary | ICD-10-CM

## 2021-05-18 DIAGNOSIS — J189 Pneumonia, unspecified organism: Secondary | ICD-10-CM | POA: Diagnosis not present

## 2021-05-18 DIAGNOSIS — J984 Other disorders of lung: Secondary | ICD-10-CM | POA: Diagnosis not present

## 2021-05-18 DIAGNOSIS — U099 Post covid-19 condition, unspecified: Secondary | ICD-10-CM | POA: Diagnosis not present

## 2021-05-18 NOTE — Progress Notes (Signed)
Vincent [name]    [id number]    28-Jun-1955  Primary Care Physician:Elkins, Curt Jews, MD  Referring Physician: Fenton Foy, NP [place],  [place] [id number]  Chief complaint: Consult for dyspnea, post-COVID  HPI: 66 year old with history of diabetes Reports 2 episodes of COVID infection.  The last one was in October 2022. Has had shortness of breath and coughing since then.  He was hospitalized in December 2022 with right lower lobe cavitary pneumonia.  Underwent bronchoscopy with BAL and transbronchial biopsies on 03/15/2021 with negative results.  He was treated with Augmentin, doxycycline.  Overall he feels improved but continues to have some fatigue.   Pets: Dogs, raises [place] Occupation: Plumber Exposures: No mold, hot tub, Jacuzzi.  No feather pillows or comforters Smoking history: Never smoker Travel history: No significant travel history Relevant family history: No family history of lung disease  Outpatient Encounter Medications as of 05/18/2021  Medication Sig   blood glucose meter kit and supplies Dispense based on patient and insurance preference. Use up to four times daily as directed. (FOR ICD-10 E11.8).   diclofenac (VOLTAREN) 75 MG EC tablet Take 75 mg by mouth 2 (two) times daily.    diclofenac Sodium (VOLTAREN) 1 % GEL Apply 1 application topically daily as needed (back pain).   GABAPENTIN PO Take 1 tablet by mouth daily.   HYDROcodone-acetaminophen (NORCO) 7.5-325 MG tablet Take 1 tablet by mouth every 6 (six) hours as needed for moderate pain.   metFORMIN (GLUCOPHAGE) 1000 MG tablet Take 500 mg by mouth 2 (two) times daily with a meal.   Oxycodone HCl 10 MG TABS Take 10 mg by mouth every 4 (four) hours as needed (pain).    Cyanocobalamin (B-12 DOTS PO) Take 1 tablet by mouth daily. (Patient not taking: Reported on 05/18/2021)   simvastatin (ZOCOR) 20 MG tablet Take 20 mg by mouth every evening. (Patient not taking: Reported on  05/18/2021)   VITAMIN D PO Take 1 tablet by mouth daily. (Patient not taking: Reported on 05/18/2021)   [DISCONTINUED] ondansetron (ZOFRAN-ODT) 4 MG disintegrating tablet Take 1 tablet (4 mg total) by mouth every 8 (eight) hours as needed for nausea or vomiting.   [DISCONTINUED] predniSONE (DELTASONE) 10 MG tablet Take 4 tabs for 2 days, then 3 tabs for 2 days, then 2 tabs for 2 days, then 1 tab for 2 days, then stop   [DISCONTINUED] promethazine (PHENERGAN) 25 MG suppository Place 1 suppository (25 mg total) rectally every 6 (six) hours as needed for nausea or vomiting.   No facility-administered encounter medications on file as of 05/18/2021.    Allergies as of 05/18/2021   (No Known Allergies)    Past Medical History:  Diagnosis Date   Arthritis    Complication of anesthesia    Reported intraoperative bleeding and "almost lost him" with back surgey around age 69, but no bleeding issues with subsequent surgeries   Diabetes mellitus without complication ([place])    per patient type 2   Facial basal cell cancer    Heart murmur    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    Nerve pain    per patient, both legs    Past Surgical History:  Procedure Laterality Date   BACK SURGERY     x2   BRONCHIAL BIOPSY  03/15/2021   Procedure: BRONCHIAL BIOPSIES;  Surgeon: Laurin Coder, MD;  Location: [place];  Service:  Pulmonary;;   BRONCHIAL WASHINGS  03/15/2021   Procedure: BRONCHIAL WASHINGS;  Surgeon: Laurin Coder, MD;  Location: [facility];  Service: Pulmonary;;   HERNIA REPAIR     INCISION AND DRAINAGE OF WOUND Left 10/27/2016   Procedure: IRRIGATION AND DEBRIDEMENT LEFT PATELLA;  Surgeon: Meredith Pel, MD;  Location: [facility];  Service: Orthopedics;  Laterality: Left;   LESION REMOVAL Left 11/01/2019   Procedure: LEFT FACIAL LESION REMOVAL;  Surgeon: Izora Gala, MD;  Location: [facility];  Service: ENT;  Laterality: Left;   SPINE SURGERY     x2   VIDEO  BRONCHOSCOPY N/A 03/15/2021   Procedure: VIDEO BRONCHOSCOPY WITH FLUORO;  Surgeon: Laurin Coder, MD;  Location: [facility];  Service: Pulmonary;  Laterality: N/A;    No family history on file.  Social History   Socioeconomic History   Marital status: Married    Spouse name: Not on file   Number of children: Not on file   Years of education: Not on file   Highest education level: Not on file  Occupational History   Not on file  Tobacco Use   Smoking status: Former    Types: Cigarettes    Quit date: 03/25/1988    Years since quitting: 33.1    Passive exposure: Past   Smokeless tobacco: Never  Vaping Use   Vaping Use: Never used  Substance and Sexual Activity   Alcohol use: No   Drug use: No   Sexual activity: Not on file  Other Topics Concern   Not on file  Social History Narrative   Not on file   Social Determinants of Health   Financial Resource Strain: Not on file  Food Insecurity: Not on file  Transportation Needs: Not on file  Physical Activity: Not on file  Stress: Not on file  Social Connections: Not on file  Intimate Partner Violence: Not on file    Review of systems: Review of Systems  Constitutional: Negative for fever and chills.  HENT: Negative.   Eyes: Negative for blurred vision.  Respiratory: as per HPI  Cardiovascular: Negative for chest pain and palpitations.  Gastrointestinal: Negative for vomiting, diarrhea, blood per rectum. Genitourinary: Negative for dysuria, urgency, frequency and hematuria.  Musculoskeletal: Negative for myalgias, back pain and joint pain.  Skin: Negative for itching and rash.  Neurological: Negative for dizziness, tremors, focal weakness, seizures and loss of consciousness.  Endo/Heme/Allergies: Negative for environmental allergies.  Psychiatric/Behavioral: Negative for depression, suicidal ideas and hallucinations.  All other systems reviewed and are negative.  Physical Exam: Blood pressure 130/60, pulse 96,  temperature 98.2 F (36.8 C), temperature source Oral, height _0  (1.778 m), weight 162 lb 6.4 oz (73.7 kg), SpO2 96 %. Gen:      No acute distress HEENT:  EOMI, sclera anicteric Neck:     No masses; no thyromegaly Lungs:    Clear to auscultation bilaterally; normal respiratory effort CV:         Regular rate and rhythm; no murmurs Abd:      + bowel sounds; soft, non-tender; no palpable masses, no distension Ext:    No edema; adequate peripheral perfusion Skin:      Warm and dry; no rash Neuro: alert and oriented x 3 Psych: normal mood and affect  Data Reviewed: Imaging: CT chest 03/13/2021-right lower lobe consolidation with cavitation, additional patchy groundglass opacities in bilateral lungs.  I have reviewed the images personally.  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

## 2021-05-18 NOTE — Patient Instructions (Signed)
I am glad you are improving after your recent hospitalization We will get a high-res CT at next available for evaluation of lungs Return to clinic in 3 months

## 2021-05-25 ENCOUNTER — Other Ambulatory Visit: Payer: Self-pay

## 2021-05-25 ENCOUNTER — Ambulatory Visit (HOSPITAL_COMMUNITY)
Admission: RE | Admit: 2021-05-25 | Discharge: 2021-05-25 | Disposition: A | Discharge status: Home / Self Care | Payer: Medicare Other | Source: Ambulatory Visit | Attending: Pulmonary Disease | Admitting: Pulmonary Disease

## 2021-05-25 DIAGNOSIS — R0602 Shortness of breath: Secondary | ICD-10-CM | POA: Diagnosis present

## 2021-05-30 ENCOUNTER — Telehealth: Payer: Self-pay | Admitting: Pulmonary Disease

## 2021-05-30 NOTE — Telephone Encounter (Signed)
Called patient and he states that he would like his CT results. I advised patient that I would message Dr Vaughan Browner and obtain those results for him.  ? ?Please advise Dr Vaughan Browner  ?

## 2021-05-30 NOTE — Telephone Encounter (Signed)
Please see imaging result note.  We left message for for him to call back. ? ?CT looks good with resolution of pneumonia.  There is plaque buildup in the blood vessels of the heart. Please follow-up with primary care to get this checked out or if you wish we can make a referral to cardiology ?

## 2021-05-31 NOTE — Telephone Encounter (Signed)
See CT result note

## 2021-06-13 ENCOUNTER — Other Ambulatory Visit (HOSPITAL_COMMUNITY): Payer: Self-pay

## 2021-08-15 ENCOUNTER — Ambulatory Visit: Payer: PRIVATE HEALTH INSURANCE | Admitting: Pulmonary Disease

## 2021-09-19 ENCOUNTER — Other Ambulatory Visit: Payer: Self-pay | Admitting: Neurological Surgery

## 2021-09-19 DIAGNOSIS — M5416 Radiculopathy, lumbar region: Secondary | ICD-10-CM

## 2021-10-03 ENCOUNTER — Ambulatory Visit
Admission: RE | Admit: 2021-10-03 | Discharge: 2021-10-03 | Disposition: A | Discharge status: Home / Self Care | Payer: Medicare Other | Source: Ambulatory Visit | Attending: Neurological Surgery | Admitting: Neurological Surgery

## 2021-10-03 DIAGNOSIS — M5416 Radiculopathy, lumbar region: Secondary | ICD-10-CM

## 2021-10-16 ENCOUNTER — Other Ambulatory Visit: Payer: Self-pay | Admitting: Neurological Surgery

## 2021-12-18 ENCOUNTER — Other Ambulatory Visit: Payer: Self-pay | Admitting: Neurological Surgery

## 2021-12-18 DIAGNOSIS — M4722 Other spondylosis with radiculopathy, cervical region: Secondary | ICD-10-CM

## 2021-12-31 NOTE — Pre-Procedure Instructions (Signed)
Surgical Instructions    Your procedure is scheduled on Wednesday, October 18th.  Report to [facility] Main Entrance "A" at 11:00 A.M., then check in with the Admitting office.  Call this number if you have problems the morning of surgery:  [phone]   If you have any questions prior to your surgery date call [phone]: Open Monday-Friday 8am-4pm    Remember:  Do not eat or drink after midnight the night before your surgery    Take these medicines the morning of surgery with A SIP OF WATER  gabapentin (NEURONTIN) oxyCODONE (ROXICODONE)   As of today, STOP taking any Aspirin (unless otherwise instructed by your surgeon) Aleve, Naproxen, Ibuprofen, Motrin, Advil, Goody's, BC's, all herbal medications, fish oil, and all vitamins. This includes diclofenac (VOLTAREN) pill and diclofenac (VOLTAREN) GEL.          HOLD Buprenorphine HCl (BELBUCA) for 3 days prior to surgery. Please consult with your primary care provider regarding the need to stop this medication for surgery per anesthesia.   WHAT DO I DO ABOUT MY DIABETES MEDICATION?   Do not take metFORMIN (GLUCOPHAGE) the morning of surgery.   HOW TO MANAGE YOUR DIABETES BEFORE AND AFTER SURGERY  Why is it important to control my blood sugar before and after surgery? Improving blood sugar levels before and after surgery helps healing and can limit problems. A way of improving blood sugar control is eating a healthy diet by:  Eating less sugar and carbohydrates  Increasing activity/exercise  Talking with your doctor about reaching your blood sugar goals High blood sugars (greater than 180 mg/dL) can raise your risk of infections and slow your recovery, so you will need to focus on controlling your diabetes during the weeks before surgery. Make sure that the doctor who takes care of your diabetes knows about your planned surgery including the date and location.  How do I manage my blood sugar before surgery? Check your blood  sugar at least 4 times a day, starting 2 days before surgery, to make sure that the level is not too high or low.  Check your blood sugar the morning of your surgery when you wake up and every 2 hours until you get to the Short Stay unit.  If your blood sugar is less than 70 mg/dL, you will need to treat for low blood sugar: Do not take insulin. Treat a low blood sugar (less than 70 mg/dL) with  cup of clear juice (cranberry or apple), 4 glucose tablets, OR glucose gel. Recheck blood sugar in 15 minutes after treatment (to make sure it is greater than 70 mg/dL). If your blood sugar is not greater than 70 mg/dL on recheck, call [phone] for further instructions. Report your blood sugar to the short stay nurse when you get to Short Stay.  If you are admitted to the hospital after surgery: Your blood sugar will be checked by the staff and you will probably be given insulin after surgery (instead of oral diabetes medicines) to make sure you have good blood sugar levels. The goal for blood sugar control after surgery is 80-180 mg/dL.             Do NOT Smoke (Tobacco/Vaping) for 24 hours prior to your procedure.  If you use a CPAP at night, you may bring your mask/headgear for your overnight stay.   Contacts, glasses, piercing's, hearing aid's, dentures or partials may not be worn into surgery, please bring cases for these belongings.    For  patients admitted to the hospital, discharge time will be determined by your treatment team.   Patients discharged the day of surgery will not be allowed to drive home, and someone needs to stay with them for 24 hours.  SURGICAL WAITING ROOM VISITATION Patients having surgery or a procedure may have no more than 2 support people in the waiting area - these visitors may rotate.   Children under the age of 54 must have an adult with them who is not the patient. If the patient needs to stay at the hospital during part of their recovery, the visitor  guidelines for inpatient rooms apply. Pre-op nurse will coordinate an appropriate time for 1 support person to accompany patient in pre-op.  This support person may not rotate.   Please refer to the [facility] website for the visitor guidelines for Inpatients (after your surgery is over and you are in a regular room).    Special instructions:   [facility]- Preparing For Surgery  Before surgery, you can play an important role. Because skin is not sterile, your skin needs to be as free of germs as possible. You can reduce the number of germs on your skin by washing with CHG (chlorahexidine gluconate) Soap before surgery.  CHG is an antiseptic cleaner which kills germs and bonds with the skin to continue killing germs even after washing.    Oral Hygiene is also important to reduce your risk of infection.  Remember - BRUSH YOUR TEETH THE MORNING OF SURGERY WITH YOUR REGULAR TOOTHPASTE  Please do not use if you have an allergy to CHG or antibacterial soaps. If your skin becomes reddened/irritated stop using the CHG.  Do not shave (including legs and underarms) for at least 48 hours prior to first CHG shower. It is OK to shave your face.  Please follow these instructions carefully.   Shower the NIGHT BEFORE SURGERY and the MORNING OF SURGERY  If you chose to wash your hair, wash your hair first as usual with your normal shampoo.  After you shampoo, rinse your hair and body thoroughly to remove the shampoo.  Use CHG Soap as you would any other liquid soap. You can apply CHG directly to the skin and wash gently with a scrungie or a clean washcloth.   Apply the CHG Soap to your body ONLY FROM THE NECK DOWN.  Do not use on open wounds or open sores. Avoid contact with your eyes, ears, mouth and genitals (private parts). Wash Face and genitals (private parts)  with your normal soap.   Wash thoroughly, paying special attention to the area where your surgery will be performed.  Thoroughly rinse  your body with warm water from the neck down.  DO NOT shower/wash with your normal soap after using and rinsing off the CHG Soap.  Pat yourself dry with a CLEAN TOWEL.  Wear CLEAN PAJAMAS to bed the night before surgery  Place CLEAN SHEETS on your bed the night before your surgery  DO NOT SLEEP WITH PETS.   Day of Surgery: Take a shower with CHG soap. Do not wear jewelry. Do not wear lotions, powders, colognes, or deodorant. Men may shave face and neck. Do not bring valuables to the hospital.  [facility] is not responsible for any belongings or valuables. Wear Clean/Comfortable clothing the morning of surgery Remember to brush your teeth WITH YOUR REGULAR TOOTHPASTE.   Please read over the following fact sheets that you were given.    If you received a  COVID test during your pre-op visit  it is requested that you wear a mask when out in public, stay away from anyone that may not be feeling well and notify your surgeon if you develop symptoms. If you have been in contact with anyone that has tested positive in the last 10 days please notify you surgeon.

## 2022-01-01 ENCOUNTER — Other Ambulatory Visit: Payer: Self-pay

## 2022-01-01 ENCOUNTER — Encounter (HOSPITAL_COMMUNITY): Payer: Self-pay

## 2022-01-01 ENCOUNTER — Encounter (HOSPITAL_COMMUNITY)
Admission: RE | Admit: 2022-01-01 | Discharge: 2022-01-01 | Disposition: A | Discharge status: Home / Self Care | Payer: Medicare Other | Source: Ambulatory Visit | Attending: Neurological Surgery | Admitting: Neurological Surgery

## 2022-01-01 VITALS — BP 120/80 | HR 104 | Temp 98.8°F | Resp 18 | Ht 70.0 in | Wt 163.5 lb

## 2022-01-01 DIAGNOSIS — E119 Type 2 diabetes mellitus without complications: Secondary | ICD-10-CM | POA: Insufficient documentation

## 2022-01-01 DIAGNOSIS — Z01818 Encounter for other preprocedural examination: Secondary | ICD-10-CM

## 2022-01-01 DIAGNOSIS — I10 Essential (primary) hypertension: Secondary | ICD-10-CM | POA: Diagnosis not present

## 2022-01-01 DIAGNOSIS — Z01812 Encounter for preprocedural laboratory examination: Secondary | ICD-10-CM | POA: Diagnosis present

## 2022-01-01 HISTORY — DX: Chronic kidney disease, unspecified: N18.9

## 2022-01-01 LAB — PROTIME-INR
INR: 1.1 (ref 0.8–1.2)
Prothrombin Time: 13.7 seconds (ref 11.4–15.2)

## 2022-01-01 LAB — COMPREHENSIVE METABOLIC PANEL
ALT: 14 U/L (ref 0–44)
AST: 17 U/L (ref 15–41)
Albumin: 3.6 g/dL (ref 3.5–5.0)
Alkaline Phosphatase: 93 U/L (ref 38–126)
Anion gap: 6 (ref 5–15)
BUN: 20 mg/dL (ref 8–23)
CO2: 27 mmol/L (ref 22–32)
Calcium: 8.9 mg/dL (ref 8.9–10.3)
Chloride: 107 mmol/L (ref 98–111)
Creatinine, Ser: 1.7 mg/dL — ABNORMAL HIGH (ref 0.61–1.24)
GFR, Estimated: 44 mL/min — ABNORMAL LOW (ref >60)
Glucose, Bld: 192 mg/dL — ABNORMAL HIGH (ref 70–99)
Potassium: 3.7 mmol/L (ref 3.5–5.1)
Sodium: 140 mmol/L (ref 135–145)
Total Bilirubin: 0.6 mg/dL (ref 0.3–1.2)
Total Protein: 6.4 g/dL — ABNORMAL LOW (ref 6.5–8.1)

## 2022-01-01 LAB — CBC
HCT: 35.3 % — ABNORMAL LOW (ref 39.0–52.0)
Hemoglobin: 11.9 g/dL — ABNORMAL LOW (ref 13.0–17.0)
MCH: 30 pg (ref 26.0–34.0)
MCHC: 33.7 g/dL (ref 30.0–36.0)
MCV: 88.9 fL (ref 80.0–100.0)
Platelets: 125 10*3/uL — ABNORMAL LOW (ref 150–400)
RBC: 3.97 MIL/uL — ABNORMAL LOW (ref 4.22–5.81)
RDW: 13.6 % (ref 11.5–15.5)
WBC: 5 10*3/uL (ref 4.0–10.5)
nRBC: 0 % (ref 0.0–0.2)

## 2022-01-01 LAB — TYPE AND SCREEN
ABO/RH(D): O POS
Antibody Screen: NEGATIVE

## 2022-01-01 LAB — SURGICAL PCR SCREEN
MRSA, PCR: NEGATIVE
Staphylococcus aureus: NEGATIVE

## 2022-01-01 LAB — GLUCOSE, CAPILLARY: Glucose-Capillary: 245 mg/dL — ABNORMAL HIGH (ref 70–99)

## 2022-01-01 LAB — HEMOGLOBIN A1C
Hgb A1c MFr Bld: 5.7 % — ABNORMAL HIGH (ref 4.8–5.6)
Mean Plasma Glucose: 116.89 mg/dL

## 2022-01-01 NOTE — Progress Notes (Signed)
[place] patient regarding 1300 appt. Stated he was in admitting.

## 2022-01-01 NOTE — Progress Notes (Signed)
PCP - Claris Gower Cardiologist - Denies  PPM/ICD - DEnies Device Orders -  Rep Notified -   Chest x-ray - 03/15/21 EKG - 03/15/21 Stress Test - Denies ECHO - Denies Cardiac Cath - Denies  Sleep Study - Denies CPAP -   DM - Type II  CBG @ PAT appt 245 Patient had just eaten lunch Fasting Blood Sugar - Unaware Checks Blood Sugar does not  Blood Thinner Instructions:No Aspirin Instructions:No  ERAS Protcol -No   COVID TEST- NI   Anesthesia review:   Patient denies shortness of breath, fever, cough and chest pain at PAT appointment   All instructions explained to the patient, with a verbal understanding of the material. Patient agrees to go over the instructions while at home for a better understanding. The opportunity to ask questions was provided.

## 2022-01-02 ENCOUNTER — Encounter (HOSPITAL_COMMUNITY): Payer: Self-pay

## 2022-01-02 ENCOUNTER — Encounter (HOSPITAL_COMMUNITY): Payer: Self-pay | Admitting: Physician Assistant

## 2022-01-05 ENCOUNTER — Ambulatory Visit
Admission: RE | Admit: 2022-01-05 | Discharge: 2022-01-05 | Disposition: A | Discharge status: Home / Self Care | Payer: Medicare Other | Source: Ambulatory Visit | Attending: Neurological Surgery | Admitting: Neurological Surgery

## 2022-01-05 DIAGNOSIS — M4722 Other spondylosis with radiculopathy, cervical region: Secondary | ICD-10-CM

## 2022-01-08 ENCOUNTER — Other Ambulatory Visit (HOSPITAL_COMMUNITY): Payer: Self-pay | Admitting: Neurological Surgery

## 2022-01-08 DIAGNOSIS — M4722 Other spondylosis with radiculopathy, cervical region: Secondary | ICD-10-CM

## 2022-01-09 ENCOUNTER — Ambulatory Visit (HOSPITAL_COMMUNITY): Admission: RE | Admit: 2022-01-09 | Payer: Medicare Other | Source: Home / Self Care | Admitting: Neurological Surgery

## 2022-01-09 ENCOUNTER — Encounter (HOSPITAL_COMMUNITY): Admission: RE | Payer: Self-pay | Source: Home / Self Care

## 2022-01-09 SURGERY — POSTERIOR LUMBAR FUSION 1 LEVEL
Anesthesia: General | Site: Back

## 2022-02-04 ENCOUNTER — Encounter (HOSPITAL_COMMUNITY): Payer: Self-pay

## 2022-02-04 ENCOUNTER — Other Ambulatory Visit: Payer: Self-pay

## 2022-02-04 NOTE — Progress Notes (Signed)
Anesthesia Chart Review: Vincent [name]   Case: [id number] Date/Time: 02/05/22 0945   Procedure: MRI WITH ANESTHESIA OF CERVICAL SPINE WITHOUT CONTRAST   Anesthesia type: General   Pre-op diagnosis: CERVICAL SPONDYLOSIS WITH RADICULOPATHY   Location: [facility] OR RADIOLOGY ROOM / [place] [facility]   Surgeons: Radiologist, Medication, MD       DISCUSSION: Patient is a 66 year old male scheduled for the above procedure. He was actually scheduled for L3-4 PLIF on 01/09/22 but surgery was cancelled. Per 01/08/22 notes/H&P (scanned under Media tab), that surgery was cancelled due to "some type of infection on the skin of his hands." He also said surgery further delayed to evaluate his neck symptoms. By notes, he can not tolerate an MRI due to claustrophobia.   History includes former smoker (quit 03/25/88), childhood murmur, DM2, CKD, skin cancer (s/p left facial BCC excision, rotation flp 11/03/19), lumbar surgeries (x2), PNA (RLL cavitary pneumonia due to Streptococcus intermedius 02/2021). He reported intraoperative bleeding during his first back surgery around age 5 (see below).    I evaluated him on 10/27/19 prior to a previous surgery. I did not hear a murmur at that time. I also spoke with him then about intraoperative bleeding history. Per my note, "In regards to his intraoperative bleeding history, he initially reported that he and his mother were "free bleeders". However, he denied ever seeing a hematologist or formal diagnosis of a specific bleeding disorder.  To his knowledge, he is never required pre-operative DDVT, PLT, or clotting factors.  When asked where the "free bleeder" term came from, he said that it was his own term used to describe how his ex-wife explained his bleeding during his first back surgery around age 30 at [facility] by Dr. Lora Havens. He said that 5-6 months after his surgery, she told him that Dr. Quentin Cornwall had told her that they "almost lost him" during surgery because they couldn't find where  he was bleeding. He went on to have subsequent surgeries including another back surgery at [facility] around age 18 also by Dr. Quentin Cornwall, left inguinal hernia repair, umbilitcal hernia repair ~ 2006, and I&D of left patella 10/27/16 for infection following knee aspiration without known bleeding issues. He reported recurrent epistaxis as a child, but not real issues as an adult. Denied any other know issues of spontaneously bleeding and also denied any prolonged bleeding with things like finger cuts. When I asked him about bleeding history in his mother, he reported that she had difficult to control bleeding after an MVA and then later was an alcoholic and fell and sustained a scalp laceration that was difficult to stop bleeding." He denied previous referral to a hematologist and denied any formal disorder of a specific bleeding disorder. Except for childhood episodes of epistaxis and intraoperative bleeding with his first back surgery, he denied any spontaneous bleeding or prolonged bleeding.   He is a same day work-up. Labs on arrival as indicated. Cr ~ 1.55-1.75 range over the past year in [facility].  Anesthesia team to evaluate on the day of surgery. He is on buprenorphine (Belbuca) 150 mcg BID.   VS: BP Readings from Last 3 Encounters:  01/01/22 120/80  05/18/21 130/60  03/16/21 106/69   Pulse Readings from Last 3 Encounters:  01/01/22 (!) 104  05/18/21 96  03/16/21 93     PROVIDERS: Leonard Downing, MD is PCP  Marshell Garfinkel, MD is pulmonologist   LABS: For labs on arrival as indicated. Last results are just over 17 days old  from 01/01/22 and include: Lab Results  Component Value Date   WBC 5.0 01/01/2022   HGB 11.9 (L) 01/01/2022   HCT 35.3 (L) 01/01/2022   PLT 125 (L) 01/01/2022   GLUCOSE 192 (H) 01/01/2022   ALT 14 01/01/2022   AST 17 01/01/2022   NA 140 01/01/2022   K 3.7 01/01/2022   CL 107 01/01/2022   CREATININE 1.70 (H) 01/01/2022   BUN 20 01/01/2022   CO2 27 01/01/2022    INR 1.1 01/01/2022   HGBA1C 5.7 (H) 01/01/2022   Lab Results  Component Value Date   CREATININE 1.70 (H) 01/01/2022   CREATININE 1.63 (H) 03/16/2021   CREATININE 1.65 (H) 03/15/2021      IMAGES: MRI L-spine 10/03/21: IMPRESSION: 1. Multilevel spondylosis of the lumbar spine as described. 2. Severe central canal stenosis at L3-4 with moderate to severe foraminal narrowing bilaterally. 3. Mild left subarticular narrowing at L1-2 and L2-3. 4. Moderate foraminal narrowing bilaterally at L2-3. 5. Mild foraminal narrowing bilaterally at L4-5. 6. Chronic loss of disc height and facet hypertrophy at L5-S1 without significant stenosis.  CT Chest High Resolution 05/25/21: IMPRESSION: 1. No findings to suggest interstitial lung disease. 2. Resolution of cavitary pneumonia in the right lower lobe with an area of post infectious scarring on today's study. A few patchy areas of peribronchovascular ground-glass attenuation remain in the right lower lobe, likely residual areas of post infectious inflammation. 3. Aortic atherosclerosis, in addition to left main and three-vessel coronary artery disease. Please note that although the presence of coronary artery calcium documents the presence of coronary artery disease, the severity of this disease and any potential stenosis cannot be assessed on this non-gated CT examination. Assessment for potential risk factor modification, dietary therapy or pharmacologic therapy may be warranted, if clinically indicated. 4. Probable splenomegaly. - Aortic Atherosclerosis (ICD10-I70.0).    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    Nerve pain    per patient, both legs    Past Surgical History:  Procedure Laterality Date   BACK SURGERY     x2   BRONCHIAL BIOPSY  03/15/2021   Procedure: BRONCHIAL BIOPSIES;  Surgeon: Laurin Coder, MD;  Location: [facility];  Service: Pulmonary;;   BRONCHIAL WASHINGS  03/15/2021   Procedure: BRONCHIAL WASHINGS;  Surgeon: Laurin Coder, MD;  Location: [facility];  Service: Pulmonary;;   HERNIA REPAIR     INCISION AND DRAINAGE OF WOUND Left 10/27/2016   Procedure: IRRIGATION AND DEBRIDEMENT LEFT PATELLA;  Surgeon: Meredith Pel, MD;  Location: [facility];  Service: Orthopedics;  Laterality: Left;   LESION REMOVAL Left 11/01/2019   Procedure: LEFT FACIAL LESION REMOVAL;  Surgeon: Izora Gala, MD;  Location: [facility];  Service: ENT;  Laterality: Left;   SPINE SURGERY     x2   VIDEO BRONCHOSCOPY N/A 03/15/2021   Procedure: VIDEO BRONCHOSCOPY WITH FLUORO;  Surgeon: Laurin Coder, MD;  Location: [facility];  Service: Pulmonary;  Laterality: N/A;    MEDICATIONS: No current facility-administered medications for this encounter.    Buprenorphine HCl (BELBUCA) 150 MCG FILM   diclofenac (VOLTAREN) 75 MG EC tablet   diclofenac Sodium (VOLTAREN) 1 % GEL   gabapentin (NEURONTIN) 300 MG capsule   metFORMIN (GLUCOPHAGE) 500 MG tablet   oxyCODONE (ROXICODONE) 15 MG immediate release tablet   tamsulosin (FLOMAX) 0.4 MG CAPS capsule   blood glucose meter kit and supplies   simvastatin (ZOCOR) 20 MG tablet    Myra Gianotti, PA-C Surgical Short Stay/Anesthesiology [facility] Phone [phone] [facility] Phone [phone] 02/04/2022 4:21 PM

## 2022-02-04 NOTE — Anesthesia Preprocedure Evaluation (Signed)
Anesthesia Evaluation  Patient identified by MRN, date of birth, ID band Patient awake    Reviewed: Allergy & Precautions, H&P , NPO status , Patient's Chart, lab work & pertinent test results  Airway Mallampati: II  TM Distance: >3 FB Neck ROM: Full    Dental no notable dental hx. (+) Poor Dentition, Dental Advisory Given   Pulmonary former smoker   Pulmonary exam normal breath sounds clear to auscultation       Cardiovascular negative cardio ROS  Rhythm:Regular Rate:Normal     Neuro/Psych negative neurological ROS  negative psych ROS   GI/Hepatic negative GI ROS, Neg liver ROS,,,  Endo/Other  diabetes, Type 2, Oral Hypoglycemic Agents    Renal/GU Renal InsufficiencyRenal diseasenegative Renal ROS  negative genitourinary   Musculoskeletal  (+) Arthritis , Osteoarthritis,    Abdominal   Peds  Hematology negative hematology ROS (+)   Anesthesia Other Findings   Reproductive/Obstetrics negative OB ROS                             Anesthesia Physical Anesthesia Plan  ASA: 2  Anesthesia Plan: General   Post-op Pain Management: Minimal or no pain anticipated   Induction: Intravenous  PONV Risk Score and Plan: 2 and Ondansetron, Dexamethasone and Midazolam  Airway Management Planned: LMA  Additional Equipment:   Intra-op Plan:   Post-operative Plan: Extubation in OR  Informed Consent: I have reviewed the patients History and Physical, chart, labs and discussed the procedure including the risks, benefits and alternatives for the proposed anesthesia with the patient or authorized representative who has indicated his/her understanding and acceptance.     Dental advisory given  Plan Discussed with: CRNA  Anesthesia Plan Comments: (PAT note written 02/04/2022 by Myra Gianotti, PA-C.  )       Anesthesia Quick Evaluation

## 2022-02-04 NOTE — Progress Notes (Signed)
Instructions Only!  ERAS Protcol - Clears until 0700  Anesthesia review: Y  Patient verbally denies any shortness of breath, fever, cough and chest pain during phone call   -------------  SDW INSTRUCTIONS given:  Your procedure is scheduled on 02/05/22.  Report to [facility] Main Entrance "A" at 0730 A.M., and check in at the Admitting office.  Call this number if you have problems the morning of surgery:  [phone]   Remember:  Do not eat after midnight the night before your surgery  You may drink clear liquids until 0700 the morning of your surgery.   Clear liquids allowed are: Water, Non-Citrus Juices (without pulp), Carbonated Beverages, Clear Tea, Black Coffee Only, and Gatorade    Take these medicines the morning of surgery with A SIP OF WATER  gabapentin (NEURONTIN)  oxyCODONE (ROXICODONE)    As of today, STOP taking any Aspirin (unless otherwise instructed by your surgeon) Aleve, Naproxen, Ibuprofen, Motrin, Advil, Goody's, BC's, all herbal medications, fish oil, and all vitamins.                      Do not wear jewelry, make up, or nail polish            Do not wear lotions, powders, perfumes/colognes, or deodorant.            Do not shave 48 hours prior to surgery.  Men may shave face and neck.            Do not bring valuables to the hospital.            [facility] is not responsible for any belongings or valuables.  Do NOT Smoke (Tobacco/Vaping) 24 hours prior to your procedure If you use a CPAP at night, you may bring all equipment for your overnight stay.   Contacts, glasses, dentures or bridgework may not be worn into surgery.      For patients admitted to the hospital, discharge time will be determined by your treatment team.   Patients discharged the day of surgery will not be allowed to drive home, and someone needs to stay with them for 24 hours.    Day of Surgery: Please shower morning of surgery  Wear Clean/Comfortable clothing the morning of  surgery Do not apply any deodorants/lotions.   Remember to brush your teeth WITH YOUR REGULAR TOOTHPASTE.   Questions were answered. Patient verbalized understanding of instructions.

## 2022-02-04 NOTE — Progress Notes (Addendum)
SDW CALL  Medical history was reviewed with patient. Patient told to arrive at the [facility] Main Entrance A at 7:30AM on 02/05/22. Pt then stated he had to get off the phone and he would call back for instructions. Chart given to late nurse to call patient back.   PCP - Leonard Downing, MD Pt does not currently have a nephrologist.  Cardiologist - denies   Chest x-ray - 03/15/21 EKG - 03/15/21 Stress Test - denies ECHO - denies Cardiac Cath - denies  Sleep Study - denies   Fasting Blood Sugar - Pt does not have a way to check blood sugars at home.    Blood Thinner Instructions:N/A Aspirin Instructions:N/A  ERAS Protcol - ERAS per protocol    Anesthesia review: sent to anesthesia d/t creatinine and to review EKG. Pt reports his surgery was cancelled in order to look into why his neck is hurting prior to surgery. Pt states that he had an infection on his hands that is now gone.

## 2022-02-05 ENCOUNTER — Encounter (HOSPITAL_COMMUNITY): Admission: RE | Disposition: A | Discharge status: Home / Self Care | Payer: Self-pay | Source: Home / Self Care

## 2022-02-05 ENCOUNTER — Ambulatory Visit (HOSPITAL_COMMUNITY)
Admission: RE | Admit: 2022-02-05 | Discharge: 2022-02-05 | Disposition: A | Discharge status: Home / Self Care | Payer: Medicare Other | Source: Ambulatory Visit | Attending: Neurological Surgery | Admitting: Neurological Surgery

## 2022-02-05 ENCOUNTER — Ambulatory Visit (HOSPITAL_COMMUNITY)
Admission: RE | Admit: 2022-02-05 | Discharge: 2022-02-05 | Disposition: A | Discharge status: Home / Self Care | Payer: Medicare Other | Attending: Neurological Surgery | Admitting: Neurological Surgery

## 2022-02-05 ENCOUNTER — Ambulatory Visit (HOSPITAL_BASED_OUTPATIENT_CLINIC_OR_DEPARTMENT_OTHER): Payer: Medicare Other | Admitting: Vascular Surgery

## 2022-02-05 ENCOUNTER — Encounter (HOSPITAL_COMMUNITY): Payer: Self-pay

## 2022-02-05 ENCOUNTER — Ambulatory Visit (HOSPITAL_COMMUNITY): Payer: Medicare Other | Admitting: Vascular Surgery

## 2022-02-05 DIAGNOSIS — M4722 Other spondylosis with radiculopathy, cervical region: Secondary | ICD-10-CM

## 2022-02-05 DIAGNOSIS — Z87891 Personal history of nicotine dependence: Secondary | ICD-10-CM | POA: Insufficient documentation

## 2022-02-05 DIAGNOSIS — E1122 Type 2 diabetes mellitus with diabetic chronic kidney disease: Secondary | ICD-10-CM | POA: Diagnosis not present

## 2022-02-05 DIAGNOSIS — Z85828 Personal history of other malignant neoplasm of skin: Secondary | ICD-10-CM | POA: Insufficient documentation

## 2022-02-05 DIAGNOSIS — M4802 Spinal stenosis, cervical region: Secondary | ICD-10-CM | POA: Insufficient documentation

## 2022-02-05 DIAGNOSIS — N189 Chronic kidney disease, unspecified: Secondary | ICD-10-CM | POA: Insufficient documentation

## 2022-02-05 HISTORY — PX: RADIOLOGY WITH ANESTHESIA: SHX6223

## 2022-02-05 LAB — BASIC METABOLIC PANEL
Anion gap: 10 (ref 5–15)
BUN: 17 mg/dL (ref 8–23)
CO2: 23 mmol/L (ref 22–32)
Calcium: 9.2 mg/dL (ref 8.9–10.3)
Chloride: 107 mmol/L (ref 98–111)
Creatinine, Ser: 1.52 mg/dL — ABNORMAL HIGH (ref 0.61–1.24)
GFR, Estimated: 50 mL/min — ABNORMAL LOW (ref >60)
Glucose, Bld: 142 mg/dL — ABNORMAL HIGH (ref 70–99)
Potassium: 4 mmol/L (ref 3.5–5.1)
Sodium: 140 mmol/L (ref 135–145)

## 2022-02-05 LAB — GLUCOSE, CAPILLARY
Glucose-Capillary: 143 mg/dL — ABNORMAL HIGH (ref 70–99)
Glucose-Capillary: 126 mg/dL — ABNORMAL HIGH (ref 70–99)

## 2022-02-05 SURGERY — MRI WITH ANESTHESIA
Anesthesia: General

## 2022-02-05 MED ORDER — LIDOCAINE 2% (20 MG/ML) 5 ML SYRINGE
INTRAMUSCULAR | Status: DC | PRN
  Administered 2022-02-05: 60 mg via INTRAVENOUS

## 2022-02-05 MED ORDER — PHENYLEPHRINE 80 MCG/ML (10ML) SYRINGE FOR IV PUSH (FOR BLOOD PRESSURE SUPPORT)
PREFILLED_SYRINGE | INTRAVENOUS | Status: DC | PRN
  Administered 2022-02-05: 80 ug via INTRAVENOUS

## 2022-02-05 MED ORDER — CHLORHEXIDINE GLUCONATE 0.12 % MT SOLN
OROMUCOSAL | Status: AC
  Administered 2022-02-05: 15 mL via OROMUCOSAL
  Filled 2022-02-05: qty 15

## 2022-02-05 MED ORDER — PROPOFOL 10 MG/ML IV BOLUS
INTRAVENOUS | Status: DC | PRN
  Administered 2022-02-05: 150 mg via INTRAVENOUS

## 2022-02-05 MED ORDER — LACTATED RINGERS IV SOLN: INTRAVENOUS | Status: DC

## 2022-02-05 MED ORDER — MIDAZOLAM HCL 2 MG/2ML IJ SOLN
INTRAMUSCULAR | Status: DC | PRN
  Administered 2022-02-05: 2 mg via INTRAVENOUS

## 2022-02-05 MED ORDER — DEXAMETHASONE SODIUM PHOSPHATE 10 MG/ML IJ SOLN
INTRAMUSCULAR | Status: DC | PRN
  Administered 2022-02-05: 4 mg via INTRAVENOUS

## 2022-02-05 MED ORDER — ONDANSETRON HCL 4 MG/2ML IJ SOLN
INTRAMUSCULAR | Status: DC | PRN
  Administered 2022-02-05: 4 mg via INTRAVENOUS

## 2022-02-05 MED ORDER — CHLORHEXIDINE GLUCONATE 0.12 % MT SOLN: 15.0000 mL | OROMUCOSAL | Status: AC

## 2022-02-05 MED ORDER — INSULIN ASPART 100 UNIT/ML IJ SOLN
INTRAMUSCULAR | Status: AC
  Filled 2022-02-05: qty 1

## 2022-02-05 MED ORDER — INSULIN ASPART 100 UNIT/ML IJ SOLN
0.0000 [IU] | INTRAMUSCULAR | Status: DC | PRN
  Administered 2022-02-05: 2 [IU] via SUBCUTANEOUS

## 2022-02-05 NOTE — Anesthesia Postprocedure Evaluation (Signed)
Anesthesia Post Note  Patient: Vincent [name]  Procedure(s) Performed: MRI WITH ANESTHESIA OF CERVICAL SPINE WITHOUT CONTRAST     Patient location during evaluation: PACU Anesthesia Type: General Level of consciousness: awake and alert Pain management: pain level controlled Vital Signs Assessment: post-procedure vital signs reviewed and stable Respiratory status: spontaneous breathing, nonlabored ventilation and respiratory function stable Cardiovascular status: blood pressure returned to baseline and stable Postop Assessment: no apparent nausea or vomiting Anesthetic complications: no  No notable events documented.  Last Vitals:  Vitals:   02/05/22 1030 02/05/22 1045  BP: 112/64 108/64  Pulse: 78   Resp: 15 15  Temp:  36.8 C  SpO2: 95%     Last Pain:  Vitals:   02/05/22 1045  TempSrc:   PainSc: 0-No pain                 Chella Chapdelaine,W. EDMOND

## 2022-02-05 NOTE — Transfer of Care (Signed)
Immediate Anesthesia Transfer of Care Note  Patient: Vincent [name]  Procedure(s) Performed: MRI WITH ANESTHESIA OF CERVICAL SPINE WITHOUT CONTRAST  Patient Location: PACU  Anesthesia Type:General  Level of Consciousness: awake, alert , and oriented  Airway & Oxygen Therapy: Patient Spontanous Breathing  Post-op Assessment: Report given to RN and Post -op Vital signs reviewed and stable  Post vital signs: Reviewed and stable  Last Vitals:  Vitals Value Taken Time  BP 115/47   Temp    Pulse 92   Resp 16   SpO2 98     Last Pain:  Vitals:   02/05/22 0807  TempSrc:   PainSc: 0-No pain      Patients Stated Pain Goal: 0 (41/99/14 4458)  Complications: No notable events documented.

## 2022-02-05 NOTE — Anesthesia Procedure Notes (Signed)
Procedure Name: LMA Insertion Date/Time: 02/05/2022 9:38 AM  Performed by: Anastasio Auerbach, CRNAPre-anesthesia Checklist: Patient identified, Emergency Drugs available, Suction available and Patient being monitored Patient Re-evaluated:Patient Re-evaluated prior to induction Oxygen Delivery Method: Circle system utilized Preoxygenation: Pre-oxygenation with 100% oxygen Induction Type: IV induction Ventilation: Mask ventilation without difficulty LMA: LMA with gastric port inserted LMA Size: 5.0 Number of attempts: 1 Placement Confirmation: breath sounds checked- equal and bilateral and positive ETCO2 Tube secured with: Tape

## 2022-02-06 ENCOUNTER — Encounter (HOSPITAL_COMMUNITY): Payer: Self-pay | Admitting: Radiology

## 2022-03-05 ENCOUNTER — Other Ambulatory Visit: Payer: Self-pay | Admitting: Neurological Surgery

## 2022-03-11 NOTE — Pre-Procedure Instructions (Signed)
Surgical Instructions    Your procedure is scheduled on Friday, December 29th.  Report to [facility] Main Entrance "A" at 09:30 A.M., then check in with the Admitting office.  Call this number if you have problems the morning of surgery:  [phone]   If you have any questions prior to your surgery date call [phone]: Open Monday-Friday 8am-4pm    Remember:  Do not eat or drink after midnight the night before your surgery      Take these medicines the morning of surgery with A SIP OF WATER  Buprenorphine HCl (BELBUCA)  gabapentin (NEURONTIN)  oxyCODONE (ROXICODONE)   Follow up with prescriber regarding Buprenorphine HCl ([place]) for post-op pain management plan   As of today, STOP taking any Aspirin (unless otherwise instructed by your surgeon) Aleve, Naproxen, Ibuprofen, Motrin, Advil, Goody's, BC's, all herbal medications, fish oil, and all vitamins. This includes diclofenac Sodium (VOLTAREN) tablet and gel.    WHAT DO I DO ABOUT MY DIABETES MEDICATION?   Do not take metFORMIN (GLUCOPHAGE) the morning of surgery.    HOW TO MANAGE YOUR DIABETES BEFORE AND AFTER SURGERY  Why is it important to control my blood sugar before and after surgery? Improving blood sugar levels before and after surgery helps healing and can limit problems. A way of improving blood sugar control is eating a healthy diet by:  Eating less sugar and carbohydrates  Increasing activity/exercise  Talking with your doctor about reaching your blood sugar goals High blood sugars (greater than 180 mg/dL) can raise your risk of infections and slow your recovery, so you will need to focus on controlling your diabetes during the weeks before surgery. Make sure that the doctor who takes care of your diabetes knows about your planned surgery including the date and location.  How do I manage my blood sugar before surgery? Check your blood sugar at least 4 times a day, starting 2 days before surgery, to  make sure that the level is not too high or low.  Check your blood sugar the morning of your surgery when you wake up and every 2 hours until you get to the Short Stay unit.  If your blood sugar is less than 70 mg/dL, you will need to treat for low blood sugar: Do not take insulin. Treat a low blood sugar (less than 70 mg/dL) with  cup of clear juice (cranberry or apple), 4 glucose tablets, OR glucose gel. Recheck blood sugar in 15 minutes after treatment (to make sure it is greater than 70 mg/dL). If your blood sugar is not greater than 70 mg/dL on recheck, call [phone] for further instructions. Report your blood sugar to the short stay nurse when you get to Short Stay.  If you are admitted to the hospital after surgery: Your blood sugar will be checked by the staff and you will probably be given insulin after surgery (instead of oral diabetes medicines) to make sure you have good blood sugar levels. The goal for blood sugar control after surgery is 80-180 mg/dL.                    Do NOT Smoke (Tobacco/Vaping) for 24 hours prior to your procedure.  If you use a CPAP at night, you may bring your mask/headgear for your overnight stay.   Contacts, glasses, piercing's, hearing aid's, dentures or partials may not be worn into surgery, please bring cases for these belongings.    For patients admitted to the hospital, discharge time  will be determined by your treatment team.   Patients discharged the day of surgery will not be allowed to drive home, and someone needs to stay with them for 24 hours.  SURGICAL WAITING ROOM VISITATION Patients having surgery or a procedure may have no more than 2 support people in the waiting area - these visitors may rotate.   Children under the age of 61 must have an adult with them who is not the patient. If the patient needs to stay at the hospital during part of their recovery, the visitor guidelines for inpatient rooms apply. Pre-op nurse will  coordinate an appropriate time for 1 support person to accompany patient in pre-op.  This support person may not rotate.   Please refer to the [facility] website for the visitor guidelines for Inpatients (after your surgery is over and you are in a regular room).    Special instructions:   [facility]- Preparing For Surgery  Before surgery, you can play an important role. Because skin is not sterile, your skin needs to be as free of germs as possible. You can reduce the number of germs on your skin by washing with CHG (chlorahexidine gluconate) Soap before surgery.  CHG is an antiseptic cleaner which kills germs and bonds with the skin to continue killing germs even after washing.    Oral Hygiene is also important to reduce your risk of infection.  Remember - BRUSH YOUR TEETH THE MORNING OF SURGERY WITH YOUR REGULAR TOOTHPASTE  Please do not use if you have an allergy to CHG or antibacterial soaps. If your skin becomes reddened/irritated stop using the CHG.  Do not shave (including legs and underarms) for at least 48 hours prior to first CHG shower. It is OK to shave your face.  Please follow these instructions carefully.   Shower the NIGHT BEFORE SURGERY and the MORNING OF SURGERY  If you chose to wash your hair, wash your hair first as usual with your normal shampoo.  After you shampoo, rinse your hair and body thoroughly to remove the shampoo.  Use CHG Soap as you would any other liquid soap. You can apply CHG directly to the skin and wash gently with a scrungie or a clean washcloth.   Apply the CHG Soap to your body ONLY FROM THE NECK DOWN.  Do not use on open wounds or open sores. Avoid contact with your eyes, ears, mouth and genitals (private parts). Wash Face and genitals (private parts)  with your normal soap.   Wash thoroughly, paying special attention to the area where your surgery will be performed.  Thoroughly rinse your body with warm water from the neck down.  DO NOT  shower/wash with your normal soap after using and rinsing off the CHG Soap.  Pat yourself dry with a CLEAN TOWEL.  Wear CLEAN PAJAMAS to bed the night before surgery  Place CLEAN SHEETS on your bed the night before your surgery  DO NOT SLEEP WITH PETS.   Day of Surgery: Take a shower with CHG soap. Do not wear jewelry Do not wear lotions, powders, colognes, or deodorant. Men may shave face and neck. Do not bring valuables to the hospital. [facility] is not responsible for any belongings or valuables.  Wear Clean/Comfortable clothing the morning of surgery Remember to brush your teeth WITH YOUR REGULAR TOOTHPASTE.   Please read over the following fact sheets that you were given.    If you received a COVID test during your pre-op visit  it is requested that you wear a mask when out in public, stay away from anyone that may not be feeling well and notify your surgeon if you develop symptoms. If you have been in contact with anyone that has tested positive in the last 10 days please notify you surgeon.

## 2022-03-12 ENCOUNTER — Inpatient Hospital Stay (HOSPITAL_COMMUNITY)
Admission: RE | Admit: 2022-03-12 | Discharge: 2022-03-12 | Disposition: A | Discharge status: Home / Self Care | Payer: PRIVATE HEALTH INSURANCE | Source: Ambulatory Visit

## 2022-03-12 NOTE — Progress Notes (Signed)
Pt did not show up for PAT appt. Pt contacted, he said that he forgot. Pt instructed to call [facility] and re-schedule. Pt also instructed to call pharmacy call center to review his medications.

## 2022-03-14 ENCOUNTER — Encounter (HOSPITAL_COMMUNITY): Payer: Self-pay | Admitting: Vascular Surgery

## 2022-03-14 ENCOUNTER — Encounter (HOSPITAL_COMMUNITY)
Admission: RE | Admit: 2022-03-14 | Discharge: 2022-03-14 | Disposition: A | Discharge status: Home / Self Care | Payer: Medicare Other | Source: Ambulatory Visit | Attending: Neurological Surgery | Admitting: Neurological Surgery

## 2022-03-14 ENCOUNTER — Other Ambulatory Visit: Payer: Self-pay

## 2022-03-14 ENCOUNTER — Encounter (HOSPITAL_COMMUNITY): Payer: Self-pay

## 2022-03-14 VITALS — BP 116/65 | HR 90 | Temp 98.7°F | Resp 18 | Ht 70.0 in | Wt 165.7 lb

## 2022-03-14 DIAGNOSIS — N189 Chronic kidney disease, unspecified: Secondary | ICD-10-CM | POA: Diagnosis not present

## 2022-03-14 DIAGNOSIS — M48061 Spinal stenosis, lumbar region without neurogenic claudication: Secondary | ICD-10-CM | POA: Insufficient documentation

## 2022-03-14 DIAGNOSIS — G4733 Obstructive sleep apnea (adult) (pediatric): Secondary | ICD-10-CM | POA: Insufficient documentation

## 2022-03-14 DIAGNOSIS — Z01818 Encounter for other preprocedural examination: Secondary | ICD-10-CM | POA: Insufficient documentation

## 2022-03-14 DIAGNOSIS — Z85828 Personal history of other malignant neoplasm of skin: Secondary | ICD-10-CM | POA: Diagnosis not present

## 2022-03-14 DIAGNOSIS — M47816 Spondylosis without myelopathy or radiculopathy, lumbar region: Secondary | ICD-10-CM | POA: Diagnosis not present

## 2022-03-14 DIAGNOSIS — I2510 Atherosclerotic heart disease of native coronary artery without angina pectoris: Secondary | ICD-10-CM | POA: Insufficient documentation

## 2022-03-14 DIAGNOSIS — I491 Atrial premature depolarization: Secondary | ICD-10-CM | POA: Insufficient documentation

## 2022-03-14 DIAGNOSIS — E1122 Type 2 diabetes mellitus with diabetic chronic kidney disease: Secondary | ICD-10-CM | POA: Insufficient documentation

## 2022-03-14 DIAGNOSIS — Z794 Long term (current) use of insulin: Secondary | ICD-10-CM | POA: Insufficient documentation

## 2022-03-14 DIAGNOSIS — Z7984 Long term (current) use of oral hypoglycemic drugs: Secondary | ICD-10-CM | POA: Diagnosis not present

## 2022-03-14 DIAGNOSIS — M47812 Spondylosis without myelopathy or radiculopathy, cervical region: Secondary | ICD-10-CM | POA: Diagnosis not present

## 2022-03-14 DIAGNOSIS — E118 Type 2 diabetes mellitus with unspecified complications: Secondary | ICD-10-CM

## 2022-03-14 DIAGNOSIS — I700 Atherosclerosis of aorta: Secondary | ICD-10-CM | POA: Insufficient documentation

## 2022-03-14 DIAGNOSIS — M4802 Spinal stenosis, cervical region: Secondary | ICD-10-CM | POA: Diagnosis not present

## 2022-03-14 DIAGNOSIS — Z79899 Other long term (current) drug therapy: Secondary | ICD-10-CM | POA: Diagnosis not present

## 2022-03-14 HISTORY — DX: Pneumonia, unspecified organism: J18.9

## 2022-03-14 LAB — PROTIME-INR
INR: 1.1 (ref 0.8–1.2)
Prothrombin Time: 14.2 seconds (ref 11.4–15.2)

## 2022-03-14 LAB — BASIC METABOLIC PANEL
Anion gap: 4 — ABNORMAL LOW (ref 5–15)
BUN: 21 mg/dL (ref 8–23)
CO2: 26 mmol/L (ref 22–32)
Calcium: 9.4 mg/dL (ref 8.9–10.3)
Chloride: 105 mmol/L (ref 98–111)
Creatinine, Ser: 1.59 mg/dL — ABNORMAL HIGH (ref 0.61–1.24)
GFR, Estimated: 48 mL/min — ABNORMAL LOW (ref >60)
Glucose, Bld: 142 mg/dL — ABNORMAL HIGH (ref 70–99)
Potassium: 4.8 mmol/L (ref 3.5–5.1)
Sodium: 135 mmol/L (ref 135–145)

## 2022-03-14 LAB — CBC
HCT: 36.9 % — ABNORMAL LOW (ref 39.0–52.0)
Hemoglobin: 12.4 g/dL — ABNORMAL LOW (ref 13.0–17.0)
MCH: 29 pg (ref 26.0–34.0)
MCHC: 33.6 g/dL (ref 30.0–36.0)
MCV: 86.4 fL (ref 80.0–100.0)
Platelets: 150 10*3/uL (ref 150–400)
RBC: 4.27 MIL/uL (ref 4.22–5.81)
RDW: 13.4 % (ref 11.5–15.5)
WBC: 6.8 10*3/uL (ref 4.0–10.5)
nRBC: 0 % (ref 0.0–0.2)

## 2022-03-14 LAB — SURGICAL PCR SCREEN
MRSA, PCR: POSITIVE — AB
Staphylococcus aureus: POSITIVE — AB

## 2022-03-14 LAB — HEMOGLOBIN A1C
Hgb A1c MFr Bld: 6.2 % — ABNORMAL HIGH (ref 4.8–5.6)
Mean Plasma Glucose: 131 mg/dL

## 2022-03-14 LAB — TYPE AND SCREEN
ABO/RH(D): O POS
Antibody Screen: NEGATIVE

## 2022-03-14 LAB — GLUCOSE, CAPILLARY: Glucose-Capillary: 149 mg/dL — ABNORMAL HIGH (ref 70–99)

## 2022-03-14 NOTE — Anesthesia Preprocedure Evaluation (Deleted)
Anesthesia Evaluation    Airway        Dental   Pulmonary former smoker          Cardiovascular      Neuro/Psych    GI/Hepatic   Endo/Other  diabetes    Renal/GU      Musculoskeletal   Abdominal   Peds  Hematology   Anesthesia Other Findings   Reproductive/Obstetrics                             Anesthesia Physical Anesthesia Plan  ASA:   Anesthesia Plan:    Post-op Pain Management:    Induction:   PONV Risk Score and Plan:   Airway Management Planned:   Additional Equipment:   Intra-op Plan:   Post-operative Plan:   Informed Consent:   Plan Discussed with:   Anesthesia Plan Comments: (PAT note written by Myra Gianotti, PA-C.  )       Anesthesia Quick Evaluation

## 2022-03-14 NOTE — Progress Notes (Addendum)
Anesthesia Chart Review:  Case: [id number] Date/Time: 03/22/22 1115   Procedure: ACDF - C3-C4 - C4-C5 - C5-C6   Anesthesia type: General   Pre-op diagnosis: Spondylosis   Location: [facility] OR ROOM 21 / [facility]   Surgeons: Eustace Moore, MD       DISCUSSION: Patient is a 66 year old Vincent [name] scheduled for the above procedure. He was actually scheduled for L3-4 PLIF on 01/09/22 but surgery was cancelled due to "some type of infection on the skin of his hands" that has since cleared. Surgery further delayed when he began to have neck symptoms. He underwent MRI C-spine on 02/05/22 (due to claustrophobia) that showed advanced degenerative changes with moderate-severe spinal canal stenosis at C4-5, moderate at C3-4, and severe neural foraminal narrowing at C3-4, C4-5, and C5-6.    History includes former smoker (quit 03/25/88), childhood murmur, DM2, CKD, OSA (does not have CPAP), skin cancer (s/p left facial BCC excision, rotation flp 11/03/19), lumbar surgeries (x2), PNA (RLL cavitary pneumonia due to Streptococcus intermedius 02/2021). He reported intraoperative bleeding during his first back surgery around age 67 (see below).    I evaluated him on 10/27/19 prior to a previous surgery given his reported childhood murmur and intra-operative bleeding history. I did not hear a murmur at that time. Per my previously note, "In regards to his intraoperative bleeding history, he initially reported that he and his mother were "free bleeders". However, he denied ever seeing a hematologist or formal diagnosis of a specific bleeding disorder.  To his knowledge, he is never required pre-operative DDVT, PLT, or clotting factors.  When asked where the "free bleeder" term came from, he said that it was his own term used to describe how his ex-wife explained his bleeding during his first back surgery around age 13 at [facility] by Dr. Lora Havens. He said that 5-6 months after his surgery, she told him that Dr. Quentin Cornwall had told her that  they "almost lost him" during surgery because they couldn't find where he was bleeding. He went on to have subsequent surgeries including another back surgery at [facility] around age 53 also by Dr. Quentin Cornwall, left inguinal hernia repair, umbilitcal hernia repair ~ 2006, and I&D of left patella 10/27/16 for infection following knee aspiration without known bleeding issues. He reported recurrent epistaxis as a child, but not real issues as an adult. Denied any other know issues of spontaneously bleeding and also denied any prolonged bleeding with things like finger cuts. When I asked him about bleeding history in his mother, he reported that she had difficult to control bleeding after an MVA and then later was an alcoholic and fell and sustained a scalp laceration that was difficult to stop bleeding." He denied previous referral to a hematologist and denied any formal disorder of a specific bleeding disorder. Except for childhood episodes of epistaxis and intraoperative bleeding with his first back surgery, he denied any spontaneous bleeding or prolonged bleeding."    Preoperative labs and EKG noted. Creatinine 1.59, consistent with prior results over the past year which has ranged Cr ~ 1.55-1.75 in [facility]. Anesthesia team to evaluate on the day of surgery. He is on buprenorphine (Belbuca) 150 mcg BID and advised to contact prescribed for perioperative pain management plan.    A1c is in process. (UPDATE 03/15/22 3:08 PM: A1c 6.2%.)  VS: BP 116/65   Pulse 90   Temp 37.1 C   Resp 18   Ht _0  (1.778 m)   Wt 75.2 kg  SpO2 96%   BMI 23.Vincent kg/m    PROVIDERS: Vincent Downing, MD is PCP  Vincent Garfinkel, MD is pulmonologist. March 2023 CT showed resolution of RLL cavitary pneumonia.   LABS: Labs reviewed: Acceptable for surgery. See DISCUSSION. (all labs ordered are listed, but only abnormal results are displayed)  Labs Reviewed  GLUCOSE, CAPILLARY - Abnormal; Notable for the following components:       Result Value   Glucose-Capillary 149 (*)    All other components within normal limits  BASIC METABOLIC PANEL - Abnormal; Notable for the following components:   Glucose, Bld 142 (*)    Creatinine, Ser 1.59 (*)    GFR, Estimated 48 (*)    Anion gap 4 (*)    All other components within normal limits  CBC - Abnormal; Notable for the following components:   Hemoglobin 12.4 (*)    HCT 36.9 (*)    All other components within normal limits  SURGICAL PCR SCREEN  PROTIME-INR  HEMOGLOBIN A1C  TYPE AND SCREEN    IMAGES: MRI C-spine 02/06/22: IMPRESSION: 1. Advanced degenerative changes of the cervical spine with moderate to severe spinal canal stenosis at C4-5 and moderate at C3-4. 2. Multilevel high-grade neural foraminal narrowing, as described above.  MRI L-spine 10/03/21: IMPRESSION: 1. Multilevel spondylosis of the lumbar spine as described. 2. Severe central canal stenosis at L3-4 with moderate to severe foraminal narrowing bilaterally. 3. Mild left subarticular narrowing at L1-2 and L2-3. 4. Moderate foraminal narrowing bilaterally at L2-3. 5. Mild foraminal narrowing bilaterally at L4-5. 6. Chronic loss of disc height and facet hypertrophy at L5-S1 without significant stenosis.   CT Chest High Resolution 05/25/21: IMPRESSION: 1. No findings to suggest interstitial lung disease. 2. Resolution of cavitary pneumonia in the right lower lobe with an area of post infectious scarring on today's study. A few patchy areas of peribronchovascular ground-glass attenuation remain in the right lower lobe, likely residual areas of post infectious inflammation. 3. Aortic atherosclerosis, in addition to left main and three-vessel coronary artery disease. Please note that although the presence of coronary artery calcium documents the presence of coronary artery disease, the severity of this disease and any potential stenosis cannot be assessed on this non-gated CT examination. Assessment  for potential risk factor modification, dietary therapy or pharmacologic therapy may be warranted, if clinically indicated. 4. Probable splenomegaly. - Aortic Atherosclerosis (ICD10-I70.0).    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    Nerve pain    per patient, both legs   Pneumonia     Past Surgical History:  Procedure Laterality Date   BACK SURGERY     x2   BRONCHIAL BIOPSY  03/15/2021   Procedure: BRONCHIAL BIOPSIES;  Surgeon: Laurin Coder, MD;  Location: [place] [facility];  Service: Pulmonary;;   BRONCHIAL WASHINGS  03/15/2021   Procedure: BRONCHIAL WASHINGS;  Surgeon: Laurin Coder, MD;  Location: [place] [facility];  Service: Pulmonary;;   HERNIA REPAIR     umbilical and right  inguinal hernia   INCISION AND DRAINAGE OF WOUND Left 10/27/2016   Procedure: IRRIGATION AND DEBRIDEMENT LEFT PATELLA;  Surgeon: Meredith Pel, MD;  Location: [place];  Service: Orthopedics;  Laterality: Left;   LESION REMOVAL Left 11/01/2019   Procedure: LEFT FACIAL LESION REMOVAL;  Surgeon: Izora Gala, MD;  Location: [place];  Service: ENT;  Laterality: Left;   RADIOLOGY WITH ANESTHESIA N/A 02/05/2022   Procedure: MRI WITH ANESTHESIA OF CERVICAL SPINE WITHOUT CONTRAST;  Surgeon: Radiologist, Medication, MD;  Location: [place];  Service: Radiology;  Laterality: N/A;   SPINE SURGERY     x2   VIDEO BRONCHOSCOPY N/A 03/15/2021   Procedure: VIDEO BRONCHOSCOPY WITH FLUORO;  Surgeon: Laurin Coder, MD;  Location: [place] [facility];  Service: Pulmonary;  Laterality: N/A;     MEDICATIONS:  blood glucose meter kit and supplies   Buprenorphine HCl (BELBUCA) 150 MCG FILM   diclofenac (VOLTAREN) 75 MG EC tablet   diclofenac Sodium (VOLTAREN) 1 % GEL   gabapentin (NEURONTIN) 300 MG capsule   metFORMIN (GLUCOPHAGE) 500 MG tablet   oxyCODONE (ROXICODONE) 15 MG immediate release tablet   simvastatin (ZOCOR) 20 MG tablet   tamsulosin (FLOMAX) 0.4 MG CAPS capsule   No current facility-administered medications for this encounter.    Myra Gianotti, PA-C Surgical Short Stay/Anesthesiology [facility] Phone [phone] [facility] Phone [phone] 03/14/2022 4:52 PM

## 2022-03-14 NOTE — Progress Notes (Signed)
PCP - wilson elkins Cardiologist - denies Pulmonologist: Mannam  PPM/ICD - denies   Chest x-ray - n/a EKG - 03/14/22 Stress Test - denies ECHO - denies Cardiac Cath - denies  Sleep Study - +OSA CPAP - does not have one  Does not check CBG at home   Follow your surgeon's instructions on when to stop Aspirin.  If no instructions were given by your surgeon then you will need to call the office to get those instructions.     ERAS Protcol -no PRE-SURGERY Ensure or G2- not ordered  COVID TEST- not needed   Anesthesia review: yes, history of heart murmur but allison z. Reviewed chart on 02/05/22. Repeat EKG today.   Patient denies shortness of breath, fever, cough and chest pain at PAT appointment   All instructions explained to the patient, with a verbal understanding of the material. Patient agrees to go over the instructions while at home for a better understanding. Patient also instructed to self quarantine after being tested for COVID-19. The opportunity to ask questions was provided.

## 2022-03-14 NOTE — Progress Notes (Signed)
Lab called +MRSA and +MSSA result. Will betadine DOS per protocol.

## 2022-03-22 ENCOUNTER — Inpatient Hospital Stay (HOSPITAL_COMMUNITY): Admission: RE | Admit: 2022-03-22 | Payer: Medicare Other | Source: Ambulatory Visit | Admitting: Neurological Surgery

## 2022-03-22 ENCOUNTER — Encounter (HOSPITAL_COMMUNITY): Admission: RE | Payer: Self-pay | Source: Ambulatory Visit

## 2022-03-22 SURGERY — ANTERIOR CERVICAL DECOMPRESSION/DISCECTOMY FUSION 3 LEVELS
Anesthesia: General

## 2022-05-13 NOTE — Anesthesia Preprocedure Evaluation (Addendum)
Anesthesia Evaluation  Patient identified by MRN, date of birth, ID band Patient awake    Reviewed: Allergy & Precautions, H&P , NPO status , Patient's Chart, lab work & pertinent test results  Airway Mallampati: II  TM Distance: >3 FB Neck ROM: Full    Dental  (+) Missing, Poor Dentition, Chipped, Dental Advisory Given   Pulmonary former smoker   Pulmonary exam normal breath sounds clear to auscultation       Cardiovascular hypertension, Normal cardiovascular exam Rhythm:Regular Rate:Normal     Neuro/Psych negative neurological ROS  negative psych ROS   GI/Hepatic negative GI ROS, Neg liver ROS,,,  Endo/Other  diabetes    Renal/GU Renal InsufficiencyRenal disease  negative genitourinary   Musculoskeletal negative musculoskeletal ROS (+)    Abdominal   Peds negative pediatric ROS (+)  Hematology negative hematology ROS (+)   Anesthesia Other Findings   Reproductive/Obstetrics negative OB ROS                             Anesthesia Physical Anesthesia Plan  ASA: 2  Anesthesia Plan: General   Post-op Pain Management: Ofirmev IV (intra-op)*   Induction: Intravenous and Rapid sequence  PONV Risk Score and Plan: 2 and Ondansetron, Dexamethasone and Treatment may vary due to age or medical condition  Airway Management Planned: Oral ETT  Additional Equipment:   Intra-op Plan:   Post-operative Plan: Extubation in OR  Informed Consent: I have reviewed the patients History and Physical, chart, labs and discussed the procedure including the risks, benefits and alternatives for the proposed anesthesia with the patient or authorized representative who has indicated his/her understanding and acceptance.     Dental advisory given  Plan Discussed with: CRNA and Surgeon  Anesthesia Plan Comments: (PAT note written by Myra Gianotti, PA-C.  Patient had solids at 0700 )        Anesthesia Quick Evaluation

## 2022-05-13 NOTE — Progress Notes (Signed)
Anesthesia follow-up:   Case: [id number] Date/Time: 05/15/22 1126   Procedure: ACDF - C3-C4 - C4-C5 - C5-C6   Anesthesia type: General   Pre-op diagnosis: Spondylosis   Location: [place] / [place] OR   Surgeons: Eustace Moore, MD       See my previous note for Date of Service 03/14/22. He was rescheduled from 03/22/22 to 05/15/22 for unclear reasons.    For updated labs on arrival. Last labs in [facility] include: Lab Results  Component Value Date   WBC 6.8 03/14/2022   HGB 12.4 (L) 03/14/2022   HCT 36.9 (L) 03/14/2022   PLT 150 03/14/2022   GLUCOSE 142 (H) 03/14/2022   ALT 14 01/01/2022   AST 17 01/01/2022   NA 135 03/14/2022   K 4.8 03/14/2022   CL 105 03/14/2022   CREATININE 1.59 (H) 03/14/2022   BUN 21 03/14/2022   CO2 26 03/14/2022   INR 1.1 03/14/2022   HGBA1C 6.2 (H) 03/14/2022     EKG 03/14/22: Normal sinus rhythm Normal ECG When compared with ECG of 13-Mar-2021 16:36, Premature atrial complexes NO LONGER PRESENT Confirmed by Adrian Prows ([id number]) on 03/15/2022 8:17:26 AM   Myra Gianotti, PA-C Surgical Short Stay/Anesthesiology [facility] Phone [phone] [facility] Phone [phone] 05/13/2022 3:35 PM

## 2022-05-17 ENCOUNTER — Other Ambulatory Visit: Payer: Self-pay | Admitting: Neurological Surgery

## 2022-05-17 ENCOUNTER — Encounter (HOSPITAL_COMMUNITY): Payer: Self-pay | Admitting: Neurological Surgery

## 2022-05-17 ENCOUNTER — Other Ambulatory Visit: Payer: Self-pay

## 2022-05-17 NOTE — Progress Notes (Signed)
Spoke with pt for pre-op call. Pt has hx of a heart murmur as a child. Denies any other cardiac history. Pt is a type 2 Diabetic. Last A1C ws 6.2 on 03/14/22. Pt is on Metformin and I instructed him to hold that day of surgery. Our call was interrupted by another call he was getting. I asked him to call me back, but after 30 minutes I called him and it went to voicemail. I left the last pre-op instructions on his phone. I told him to call back if he had any questions. I also instructed him to notify the doctor who prescribes the Butrans patch for perioperative pain management.

## 2022-05-20 ENCOUNTER — Inpatient Hospital Stay (HOSPITAL_COMMUNITY): Payer: Medicare Other | Admitting: Vascular Surgery

## 2022-05-20 ENCOUNTER — Other Ambulatory Visit: Payer: Self-pay

## 2022-05-20 ENCOUNTER — Inpatient Hospital Stay (HOSPITAL_COMMUNITY): Payer: Medicare Other

## 2022-05-20 ENCOUNTER — Inpatient Hospital Stay (HOSPITAL_COMMUNITY)
Admission: RE | Admit: 2022-05-20 | Discharge: 2022-05-21 | DRG: 473 | Disposition: A | Discharge status: Home / Self Care | Payer: Medicare Other | Attending: Neurological Surgery | Admitting: Neurological Surgery

## 2022-05-20 ENCOUNTER — Encounter (HOSPITAL_COMMUNITY): Payer: Self-pay | Admitting: Neurological Surgery

## 2022-05-20 ENCOUNTER — Inpatient Hospital Stay (HOSPITAL_COMMUNITY)
Admission: RE | Disposition: A | Discharge status: Home / Self Care | Payer: Self-pay | Source: Home / Self Care | Attending: Neurological Surgery

## 2022-05-20 DIAGNOSIS — Z981 Arthrodesis status: Secondary | ICD-10-CM | POA: Diagnosis present

## 2022-05-20 DIAGNOSIS — Z7984 Long term (current) use of oral hypoglycemic drugs: Secondary | ICD-10-CM

## 2022-05-20 DIAGNOSIS — M4722 Other spondylosis with radiculopathy, cervical region: Secondary | ICD-10-CM | POA: Diagnosis present

## 2022-05-20 DIAGNOSIS — I129 Hypertensive chronic kidney disease with stage 1 through stage 4 chronic kidney disease, or unspecified chronic kidney disease: Secondary | ICD-10-CM | POA: Diagnosis present

## 2022-05-20 DIAGNOSIS — Z91041 Radiographic dye allergy status: Secondary | ICD-10-CM

## 2022-05-20 DIAGNOSIS — N189 Chronic kidney disease, unspecified: Secondary | ICD-10-CM | POA: Diagnosis present

## 2022-05-20 DIAGNOSIS — Z85828 Personal history of other malignant neoplasm of skin: Secondary | ICD-10-CM | POA: Diagnosis not present

## 2022-05-20 DIAGNOSIS — Z87891 Personal history of nicotine dependence: Secondary | ICD-10-CM

## 2022-05-20 DIAGNOSIS — Z880 Allergy status to penicillin: Secondary | ICD-10-CM | POA: Diagnosis not present

## 2022-05-20 DIAGNOSIS — M4802 Spinal stenosis, cervical region: Secondary | ICD-10-CM | POA: Diagnosis present

## 2022-05-20 DIAGNOSIS — Z79899 Other long term (current) drug therapy: Secondary | ICD-10-CM

## 2022-05-20 DIAGNOSIS — M47812 Spondylosis without myelopathy or radiculopathy, cervical region: Secondary | ICD-10-CM

## 2022-05-20 DIAGNOSIS — Z8701 Personal history of pneumonia (recurrent): Secondary | ICD-10-CM

## 2022-05-20 DIAGNOSIS — Z8616 Personal history of COVID-19: Secondary | ICD-10-CM | POA: Diagnosis not present

## 2022-05-20 DIAGNOSIS — Z79891 Long term (current) use of opiate analgesic: Secondary | ICD-10-CM

## 2022-05-20 DIAGNOSIS — E1122 Type 2 diabetes mellitus with diabetic chronic kidney disease: Secondary | ICD-10-CM | POA: Diagnosis present

## 2022-05-20 HISTORY — PX: ANTERIOR CERVICAL DECOMP/DISCECTOMY FUSION: SHX1161

## 2022-05-20 LAB — CBC
HCT: 36.2 % — ABNORMAL LOW (ref 39.0–52.0)
Hemoglobin: 12.4 g/dL — ABNORMAL LOW (ref 13.0–17.0)
MCH: 29.4 pg (ref 26.0–34.0)
MCHC: 34.3 g/dL (ref 30.0–36.0)
MCV: 85.8 fL (ref 80.0–100.0)
Platelets: 123 10*3/uL — ABNORMAL LOW (ref 150–400)
RBC: 4.22 MIL/uL (ref 4.22–5.81)
RDW: 13.2 % (ref 11.5–15.5)
WBC: 4.8 10*3/uL (ref 4.0–10.5)
nRBC: 0 % (ref 0.0–0.2)

## 2022-05-20 LAB — GLUCOSE, CAPILLARY
Glucose-Capillary: 221 mg/dL — ABNORMAL HIGH (ref 70–99)
Glucose-Capillary: 113 mg/dL — ABNORMAL HIGH (ref 70–99)
Glucose-Capillary: 182 mg/dL — ABNORMAL HIGH (ref 70–99)
Glucose-Capillary: 210 mg/dL — ABNORMAL HIGH (ref 70–99)

## 2022-05-20 LAB — SURGICAL PCR SCREEN
MRSA, PCR: NEGATIVE
Staphylococcus aureus: NEGATIVE

## 2022-05-20 LAB — BASIC METABOLIC PANEL
Anion gap: 13 (ref 5–15)
BUN: 18 mg/dL (ref 8–23)
CO2: 22 mmol/L (ref 22–32)
Calcium: 9.2 mg/dL (ref 8.9–10.3)
Chloride: 102 mmol/L (ref 98–111)
Creatinine, Ser: 1.65 mg/dL — ABNORMAL HIGH (ref 0.61–1.24)
GFR, Estimated: 46 mL/min — ABNORMAL LOW (ref >60)
Glucose, Bld: 210 mg/dL — ABNORMAL HIGH (ref 70–99)
Potassium: 3.9 mmol/L (ref 3.5–5.1)
Sodium: 137 mmol/L (ref 135–145)

## 2022-05-20 LAB — TYPE AND SCREEN
ABO/RH(D): O POS
Antibody Screen: NEGATIVE

## 2022-05-20 LAB — PROTIME-INR
INR: 1.2 (ref 0.8–1.2)
Prothrombin Time: 14.9 seconds (ref 11.4–15.2)

## 2022-05-20 SURGERY — ANTERIOR CERVICAL DECOMPRESSION/DISCECTOMY FUSION 3 LEVELS
Anesthesia: General | Site: Neck

## 2022-05-20 MED ORDER — POTASSIUM CHLORIDE IN NACL 20-0.9 MEQ/L-% IV SOLN: INTRAVENOUS | Status: DC

## 2022-05-20 MED ORDER — MENTHOL 3 MG MT LOZG: 1.0000 | LOZENGE | OROMUCOSAL | Status: DC | PRN

## 2022-05-20 MED ORDER — VANCOMYCIN HCL 750 MG/150ML IV SOLN
750.0000 mg | Freq: Two times a day (BID) | INTRAVENOUS | Status: DC
  Administered 2022-05-20 – 2022-05-21 (×2): 750 mg via INTRAVENOUS
  Filled 2022-05-20 (×2): qty 150

## 2022-05-20 MED ORDER — BUPRENORPHINE 20 MCG/HR TD PTWK: 1.0000 | MEDICATED_PATCH | TRANSDERMAL | Status: DC

## 2022-05-20 MED ORDER — ORAL CARE MOUTH RINSE: 15.0000 mL | Freq: Once | OROMUCOSAL | Status: AC

## 2022-05-20 MED ORDER — ONDANSETRON HCL 4 MG PO TABS: 4.0000 mg | ORAL_TABLET | Freq: Four times a day (QID) | ORAL | Status: DC | PRN

## 2022-05-20 MED ORDER — ONDANSETRON HCL 4 MG/2ML IJ SOLN
INTRAMUSCULAR | Status: AC
  Filled 2022-05-20: qty 2

## 2022-05-20 MED ORDER — THROMBIN 5000 UNITS EX SOLR
CUTANEOUS | Status: AC
  Filled 2022-05-20: qty 5000

## 2022-05-20 MED ORDER — GABAPENTIN 300 MG PO CAPS
ORAL_CAPSULE | ORAL | Status: AC
  Filled 2022-05-20: qty 1

## 2022-05-20 MED ORDER — ONDANSETRON HCL 4 MG/2ML IJ SOLN: 4.0000 mg | Freq: Once | INTRAMUSCULAR | Status: DC | PRN

## 2022-05-20 MED ORDER — METHOCARBAMOL 1000 MG/10ML IJ SOLN: 500.0000 mg | Freq: Four times a day (QID) | INTRAVENOUS | Status: DC | PRN

## 2022-05-20 MED ORDER — DEXMEDETOMIDINE HCL IN NACL 80 MCG/20ML IV SOLN
INTRAVENOUS | Status: DC | PRN
  Administered 2022-05-20: 12 ug via INTRAVENOUS

## 2022-05-20 MED ORDER — ACETAMINOPHEN 500 MG PO TABS
1000.0000 mg | ORAL_TABLET | Freq: Four times a day (QID) | ORAL | Status: DC
  Administered 2022-05-20 – 2022-05-21 (×3): 1000 mg via ORAL
  Filled 2022-05-20 (×3): qty 2

## 2022-05-20 MED ORDER — ROCURONIUM BROMIDE 10 MG/ML (PF) SYRINGE
PREFILLED_SYRINGE | INTRAVENOUS | Status: DC | PRN
  Administered 2022-05-20: 20 mg via INTRAVENOUS
  Administered 2022-05-20: 60 mg via INTRAVENOUS

## 2022-05-20 MED ORDER — SUGAMMADEX SODIUM 200 MG/2ML IV SOLN
INTRAVENOUS | Status: DC | PRN
  Administered 2022-05-20: 200 mg via INTRAVENOUS

## 2022-05-20 MED ORDER — LIDOCAINE 2% (20 MG/ML) 5 ML SYRINGE
INTRAMUSCULAR | Status: AC
  Filled 2022-05-20: qty 5

## 2022-05-20 MED ORDER — DEXAMETHASONE SODIUM PHOSPHATE 10 MG/ML IJ SOLN
INTRAMUSCULAR | Status: AC
  Filled 2022-05-20: qty 1

## 2022-05-20 MED ORDER — KETAMINE HCL 10 MG/ML IJ SOLN
INTRAMUSCULAR | Status: DC | PRN
  Administered 2022-05-20: 10 mg via INTRAVENOUS
  Administered 2022-05-20: 15 mg via INTRAVENOUS
  Administered 2022-05-20: 25 mg via INTRAVENOUS

## 2022-05-20 MED ORDER — INSULIN ASPART 100 UNIT/ML IJ SOLN: 0.0000 [IU] | Freq: Three times a day (TID) | INTRAMUSCULAR | Status: DC

## 2022-05-20 MED ORDER — GELATIN ABSORBABLE 100 EX MISC
CUTANEOUS | Status: DC | PRN
  Administered 2022-05-20: 1 via TOPICAL

## 2022-05-20 MED ORDER — BUPIVACAINE HCL (PF) 0.25 % IJ SOLN
INTRAMUSCULAR | Status: AC
  Filled 2022-05-20: qty 30

## 2022-05-20 MED ORDER — CHLORHEXIDINE GLUCONATE 0.12 % MT SOLN
OROMUCOSAL | Status: AC
  Administered 2022-05-20: 15 mL via OROMUCOSAL
  Filled 2022-05-20: qty 15

## 2022-05-20 MED ORDER — GABAPENTIN 300 MG PO CAPS
300.0000 mg | ORAL_CAPSULE | Freq: Three times a day (TID) | ORAL | Status: DC
  Administered 2022-05-20 (×2): 300 mg via ORAL
  Filled 2022-05-20 (×3): qty 1

## 2022-05-20 MED ORDER — 0.9 % SODIUM CHLORIDE (POUR BTL) OPTIME
TOPICAL | Status: DC | PRN
  Administered 2022-05-20: 1000 mL

## 2022-05-20 MED ORDER — CHLORHEXIDINE GLUCONATE CLOTH 2 % EX PADS: 6.0000 | MEDICATED_PAD | Freq: Once | CUTANEOUS | Status: DC

## 2022-05-20 MED ORDER — MORPHINE SULFATE (PF) 2 MG/ML IV SOLN
2.0000 mg | INTRAVENOUS | Status: DC | PRN
  Filled 2022-05-20: qty 1

## 2022-05-20 MED ORDER — TAMSULOSIN HCL 0.4 MG PO CAPS
0.4000 mg | ORAL_CAPSULE | Freq: Every day | ORAL | Status: DC
  Administered 2022-05-20: 0.4 mg via ORAL
  Filled 2022-05-20: qty 1

## 2022-05-20 MED ORDER — OXYCODONE HCL 5 MG PO TABS
15.0000 mg | ORAL_TABLET | Freq: Every day | ORAL | Status: DC
  Administered 2022-05-20 – 2022-05-21 (×3): 15 mg via ORAL
  Filled 2022-05-20 (×3): qty 3

## 2022-05-20 MED ORDER — VANCOMYCIN HCL IN DEXTROSE 1-5 GM/200ML-% IV SOLN
INTRAVENOUS | Status: AC
  Administered 2022-05-20: 1000 mg via INTRAVENOUS
  Filled 2022-05-20: qty 200

## 2022-05-20 MED ORDER — FENTANYL CITRATE (PF) 250 MCG/5ML IJ SOLN
INTRAMUSCULAR | Status: AC
  Filled 2022-05-20: qty 5

## 2022-05-20 MED ORDER — INSULIN ASPART 100 UNIT/ML IJ SOLN
INTRAMUSCULAR | Status: AC
  Administered 2022-05-20: 6 [IU] via SUBCUTANEOUS
  Filled 2022-05-20: qty 1

## 2022-05-20 MED ORDER — SODIUM CHLORIDE 0.9% FLUSH: 3.0000 mL | INTRAVENOUS | Status: DC | PRN

## 2022-05-20 MED ORDER — KETOROLAC TROMETHAMINE 30 MG/ML IJ SOLN
30.0000 mg | Freq: Once | INTRAMUSCULAR | Status: AC | PRN
  Administered 2022-05-20: 30 mg via INTRAVENOUS

## 2022-05-20 MED ORDER — PROPOFOL 10 MG/ML IV BOLUS
INTRAVENOUS | Status: AC
  Filled 2022-05-20: qty 20

## 2022-05-20 MED ORDER — PHENYLEPHRINE 80 MCG/ML (10ML) SYRINGE FOR IV PUSH (FOR BLOOD PRESSURE SUPPORT)
PREFILLED_SYRINGE | INTRAVENOUS | Status: AC
  Filled 2022-05-20: qty 10

## 2022-05-20 MED ORDER — ZOLPIDEM TARTRATE 5 MG PO TABS
5.0000 mg | ORAL_TABLET | Freq: Every evening | ORAL | Status: DC | PRN
  Administered 2022-05-20: 5 mg via ORAL
  Filled 2022-05-20: qty 1

## 2022-05-20 MED ORDER — INSULIN ASPART 100 UNIT/ML IJ SOLN: 0.0000 [IU] | INTRAMUSCULAR | Status: DC | PRN

## 2022-05-20 MED ORDER — MIDAZOLAM HCL 2 MG/2ML IJ SOLN
INTRAMUSCULAR | Status: AC
  Filled 2022-05-20: qty 2

## 2022-05-20 MED ORDER — SENNA 8.6 MG PO TABS
1.0000 | ORAL_TABLET | Freq: Two times a day (BID) | ORAL | Status: DC
  Administered 2022-05-20: 8.6 mg via ORAL
  Filled 2022-05-20: qty 1

## 2022-05-20 MED ORDER — OXYCODONE HCL 5 MG/5ML PO SOLN: 5.0000 mg | Freq: Once | ORAL | Status: AC | PRN

## 2022-05-20 MED ORDER — EPHEDRINE SULFATE-NACL 50-0.9 MG/10ML-% IV SOSY
PREFILLED_SYRINGE | INTRAVENOUS | Status: DC | PRN
  Administered 2022-05-20: 10 mg via INTRAVENOUS
  Administered 2022-05-20: 5 mg via INTRAVENOUS

## 2022-05-20 MED ORDER — ONDANSETRON HCL 4 MG/2ML IJ SOLN: 4.0000 mg | Freq: Four times a day (QID) | INTRAMUSCULAR | Status: DC | PRN

## 2022-05-20 MED ORDER — CHLORHEXIDINE GLUCONATE 0.12 % MT SOLN: 15.0000 mL | Freq: Once | OROMUCOSAL | Status: AC

## 2022-05-20 MED ORDER — HYDROMORPHONE HCL 1 MG/ML IJ SOLN
INTRAMUSCULAR | Status: AC
  Filled 2022-05-20: qty 1

## 2022-05-20 MED ORDER — SODIUM CHLORIDE 0.9 % IV SOLN
250.0000 mL | INTRAVENOUS | Status: DC
  Administered 2022-05-20: 250 mL via INTRAVENOUS

## 2022-05-20 MED ORDER — HYDROMORPHONE HCL 1 MG/ML IJ SOLN
0.2500 mg | INTRAMUSCULAR | Status: DC | PRN
  Administered 2022-05-20 (×4): 0.5 mg via INTRAVENOUS

## 2022-05-20 MED ORDER — EPHEDRINE 5 MG/ML INJ
INTRAVENOUS | Status: AC
  Filled 2022-05-20: qty 5

## 2022-05-20 MED ORDER — LIDOCAINE 2% (20 MG/ML) 5 ML SYRINGE
INTRAMUSCULAR | Status: DC | PRN
  Administered 2022-05-20: 60 mg via INTRAVENOUS

## 2022-05-20 MED ORDER — DEXMEDETOMIDINE HCL IN NACL 80 MCG/20ML IV SOLN
INTRAVENOUS | Status: AC
  Filled 2022-05-20: qty 20

## 2022-05-20 MED ORDER — METFORMIN HCL ER 500 MG PO TB24
500.0000 mg | ORAL_TABLET | Freq: Two times a day (BID) | ORAL | Status: DC
  Administered 2022-05-20 – 2022-05-21 (×2): 500 mg via ORAL
  Filled 2022-05-20 (×2): qty 1

## 2022-05-20 MED ORDER — METHOCARBAMOL 500 MG PO TABS
500.0000 mg | ORAL_TABLET | Freq: Four times a day (QID) | ORAL | Status: DC | PRN
  Administered 2022-05-20: 500 mg via ORAL
  Filled 2022-05-20 (×2): qty 1

## 2022-05-20 MED ORDER — PROPOFOL 10 MG/ML IV BOLUS
INTRAVENOUS | Status: DC | PRN
  Administered 2022-05-20: 150 mg via INTRAVENOUS

## 2022-05-20 MED ORDER — ONDANSETRON HCL 4 MG/2ML IJ SOLN
INTRAMUSCULAR | Status: DC | PRN
  Administered 2022-05-20: 4 mg via INTRAVENOUS

## 2022-05-20 MED ORDER — ACETAMINOPHEN 500 MG PO TABS
ORAL_TABLET | ORAL | Status: AC
  Administered 2022-05-20: 1000 mg via ORAL
  Filled 2022-05-20: qty 2

## 2022-05-20 MED ORDER — HYDROMORPHONE HCL 1 MG/ML IJ SOLN
INTRAMUSCULAR | Status: AC
  Filled 2022-05-20: qty 0.5

## 2022-05-20 MED ORDER — PHENYLEPHRINE 80 MCG/ML (10ML) SYRINGE FOR IV PUSH (FOR BLOOD PRESSURE SUPPORT)
PREFILLED_SYRINGE | INTRAVENOUS | Status: DC | PRN
  Administered 2022-05-20: 160 ug via INTRAVENOUS
  Administered 2022-05-20: 80 ug via INTRAVENOUS
  Administered 2022-05-20: 160 ug via INTRAVENOUS

## 2022-05-20 MED ORDER — GABAPENTIN 300 MG PO CAPS: 300.0000 mg | ORAL_CAPSULE | ORAL | Status: DC

## 2022-05-20 MED ORDER — ROCURONIUM BROMIDE 10 MG/ML (PF) SYRINGE
PREFILLED_SYRINGE | INTRAVENOUS | Status: AC
  Filled 2022-05-20: qty 10

## 2022-05-20 MED ORDER — HYDROMORPHONE HCL 1 MG/ML IJ SOLN
INTRAMUSCULAR | Status: DC | PRN
  Administered 2022-05-20: .5 mg via INTRAVENOUS

## 2022-05-20 MED ORDER — PHENOL 1.4 % MT LIQD: 1.0000 | OROMUCOSAL | Status: DC | PRN

## 2022-05-20 MED ORDER — OXYCODONE HCL 5 MG PO TABS
5.0000 mg | ORAL_TABLET | Freq: Once | ORAL | Status: AC | PRN
  Administered 2022-05-20: 5 mg via ORAL

## 2022-05-20 MED ORDER — SUCCINYLCHOLINE CHLORIDE 200 MG/10ML IV SOSY
PREFILLED_SYRINGE | INTRAVENOUS | Status: DC | PRN
  Administered 2022-05-20: 120 mg via INTRAVENOUS

## 2022-05-20 MED ORDER — LACTATED RINGERS IV SOLN: INTRAVENOUS | Status: DC

## 2022-05-20 MED ORDER — KETOROLAC TROMETHAMINE 30 MG/ML IJ SOLN
INTRAMUSCULAR | Status: AC
  Filled 2022-05-20: qty 1

## 2022-05-20 MED ORDER — PHENYLEPHRINE HCL-NACL 20-0.9 MG/250ML-% IV SOLN
INTRAVENOUS | Status: DC | PRN
  Administered 2022-05-20: 30 ug/min via INTRAVENOUS

## 2022-05-20 MED ORDER — VANCOMYCIN HCL IN DEXTROSE 1-5 GM/200ML-% IV SOLN: 1000.0000 mg | INTRAVENOUS | Status: AC

## 2022-05-20 MED ORDER — ACETAMINOPHEN 10 MG/ML IV SOLN
INTRAVENOUS | Status: AC
  Filled 2022-05-20: qty 100

## 2022-05-20 MED ORDER — ACETAMINOPHEN 500 MG PO TABS: 1000.0000 mg | ORAL_TABLET | ORAL | Status: AC

## 2022-05-20 MED ORDER — FENTANYL CITRATE (PF) 250 MCG/5ML IJ SOLN
INTRAMUSCULAR | Status: DC | PRN
  Administered 2022-05-20: 150 ug via INTRAVENOUS
  Administered 2022-05-20: 100 ug via INTRAVENOUS

## 2022-05-20 MED ORDER — THROMBIN 5000 UNITS EX SOLR
CUTANEOUS | Status: DC | PRN
  Administered 2022-05-20: 5000 [IU] via TOPICAL

## 2022-05-20 MED ORDER — BUPIVACAINE HCL (PF) 0.25 % IJ SOLN
INTRAMUSCULAR | Status: DC | PRN
  Administered 2022-05-20: 8 mL

## 2022-05-20 MED ORDER — SODIUM CHLORIDE 0.9% FLUSH: 3.0000 mL | Freq: Two times a day (BID) | INTRAVENOUS | Status: DC

## 2022-05-20 MED ORDER — DEXAMETHASONE SODIUM PHOSPHATE 10 MG/ML IJ SOLN
INTRAMUSCULAR | Status: DC | PRN
  Administered 2022-05-20: 5 mg via INTRAVENOUS

## 2022-05-20 MED ORDER — KETAMINE HCL 50 MG/5ML IJ SOSY
PREFILLED_SYRINGE | INTRAMUSCULAR | Status: AC
  Filled 2022-05-20: qty 5

## 2022-05-20 MED ORDER — THROMBIN 20000 UNITS EX KIT
PACK | CUTANEOUS | Status: AC
  Filled 2022-05-20: qty 1

## 2022-05-20 MED ORDER — OXYCODONE HCL 5 MG PO TABS
ORAL_TABLET | ORAL | Status: AC
  Filled 2022-05-20: qty 1

## 2022-05-20 MED ORDER — MIDAZOLAM HCL 2 MG/2ML IJ SOLN
INTRAMUSCULAR | Status: DC | PRN
  Administered 2022-05-20: 2 mg via INTRAVENOUS

## 2022-05-20 MED ORDER — THROMBIN 5000 UNITS EX SOLR
OROMUCOSAL | Status: DC | PRN
  Administered 2022-05-20 (×2): 5 mL via TOPICAL

## 2022-05-20 SURGICAL SUPPLY — 50 items
BAG COUNTER SPONGE SURGICOUNT (BAG) ×1 IMPLANT
BAND RUBBER #18 3X1/16 STRL (MISCELLANEOUS) ×2 IMPLANT
BASKET BONE COLLECTION (BASKET) IMPLANT
BENZOIN TINCTURE PRP APPL 2/3 (GAUZE/BANDAGES/DRESSINGS) ×1 IMPLANT
BIT DRILL 2.3X12 (BIT) IMPLANT
BUR CARBIDE MATCH 3.0 (BURR) ×1 IMPLANT
CANISTER SUCT 3000ML PPV (MISCELLANEOUS) ×1 IMPLANT
DRAIN JACKSON PRT FLT 7MM (DRAIN) IMPLANT
DRAPE C-ARM 42X72 X-RAY (DRAPES) ×2 IMPLANT
DRAPE LAPAROTOMY 100X72 PEDS (DRAPES) ×1 IMPLANT
DRAPE MICROSCOPE SLANT 54X150 (MISCELLANEOUS) ×1 IMPLANT
DRSG OPSITE POSTOP 4X6 (GAUZE/BANDAGES/DRESSINGS) IMPLANT
DURAPREP 6ML APPLICATOR 50/CS (WOUND CARE) ×1 IMPLANT
ELECT COATED BLADE 2.86 ST (ELECTRODE) ×1 IMPLANT
ELECT REM PT RETURN 9FT ADLT (ELECTROSURGICAL) ×1
ELECTRODE REM PT RTRN 9FT ADLT (ELECTROSURGICAL) ×1 IMPLANT
EVACUATOR SILICONE 100CC (DRAIN) IMPLANT
GAUZE 4X4 16PLY ~~LOC~~+RFID DBL (SPONGE) IMPLANT
GLOVE BIO SURGEON STRL SZ7 (GLOVE) IMPLANT
GLOVE BIO SURGEON STRL SZ8 (GLOVE) ×1 IMPLANT
GLOVE BIOGEL PI IND STRL 7.0 (GLOVE) IMPLANT
GOWN STRL REUS W/ TWL LRG LVL3 (GOWN DISPOSABLE) IMPLANT
GOWN STRL REUS W/ TWL XL LVL3 (GOWN DISPOSABLE) ×1 IMPLANT
GOWN STRL REUS W/TWL 2XL LVL3 (GOWN DISPOSABLE) IMPLANT
GOWN STRL REUS W/TWL LRG LVL3 (GOWN DISPOSABLE) ×1
GOWN STRL REUS W/TWL XL LVL3 (GOWN DISPOSABLE) ×1
GRAFT IFACTOR PEPTIDE 2.5CC (Graft) IMPLANT
HEMOSTAT POWDER KIT SURGIFOAM (HEMOSTASIS) ×1 IMPLANT
KIT BASIN OR (CUSTOM PROCEDURE TRAY) ×1 IMPLANT
KIT TURNOVER KIT B (KITS) ×1 IMPLANT
NDL HYPO 25X1 1.5 SAFETY (NEEDLE) ×1 IMPLANT
NDL SPNL 20GX3.5 QUINCKE YW (NEEDLE) ×1 IMPLANT
NEEDLE HYPO 25X1 1.5 SAFETY (NEEDLE) ×1 IMPLANT
NEEDLE SPNL 20GX3.5 QUINCKE YW (NEEDLE) ×1 IMPLANT
NS IRRIG 1000ML POUR BTL (IV SOLUTION) ×1 IMPLANT
PACK LAMINECTOMY NEURO (CUSTOM PROCEDURE TRAY) ×1 IMPLANT
PAD ARMBOARD 7.5X6 YLW CONV (MISCELLANEOUS) ×1 IMPLANT
PIN DISTRACTION 14MM (PIN) IMPLANT
PLATE INSIG 58 3L (Plate) IMPLANT
SCREW VA SINGLE LEAD 4X16 (Screw) IMPLANT
SPACER IDENTITI 7X18X16 7D (Spacer) IMPLANT
SPACER IDENTITI PS 6X18X16 7D (Spacer) IMPLANT
SPONGE INTESTINAL PEANUT (DISPOSABLE) ×1 IMPLANT
SPONGE SURGIFOAM ABS GEL 100 (HEMOSTASIS) IMPLANT
STRIP CLOSURE SKIN 1/2X4 (GAUZE/BANDAGES/DRESSINGS) ×1 IMPLANT
SUT VIC AB 3-0 SH 8-18 (SUTURE) ×1 IMPLANT
SUT VICRYL 4-0 PS2 18IN ABS (SUTURE) IMPLANT
TOWEL GREEN STERILE (TOWEL DISPOSABLE) ×1 IMPLANT
TOWEL GREEN STERILE FF (TOWEL DISPOSABLE) ×1 IMPLANT
WATER STERILE IRR 1000ML POUR (IV SOLUTION) ×1 IMPLANT

## 2022-05-20 NOTE — Transfer of Care (Signed)
Immediate Anesthesia Transfer of Care Note  Patient: Vincent [name]  Procedure(s) Performed: ACDF - C3-C4 - C4-C5 - C5-C6 (Neck)  Patient Location: PACU  Anesthesia Type:General  Level of Consciousness: drowsy and patient cooperative  Airway & Oxygen Therapy: Patient Spontanous Breathing and Patient connected to nasal cannula oxygen  Post-op Assessment: Report given to RN and Post -op Vital signs reviewed and stable  Post vital signs: Reviewed and stable  Last Vitals:  Vitals Value Taken Time  BP 133/87 05/20/22 1520  Temp    Pulse 103 05/20/22 1526  Resp 15 05/20/22 1526  SpO2 94 % 05/20/22 1526  Vitals shown include unvalidated device data.  Last Pain:  Vitals:   05/20/22 1116  TempSrc:   PainSc: 9          Complications: No notable events documented.

## 2022-05-20 NOTE — Anesthesia Procedure Notes (Signed)
Procedure Name: Intubation Date/Time: 05/20/2022 12:34 PM  Performed by: Mosetta Pigeon, CRNAPre-anesthesia Checklist: Patient identified, Emergency Drugs available, Suction available and Patient being monitored Patient Re-evaluated:Patient Re-evaluated prior to induction Oxygen Delivery Method: Circle System Utilized Preoxygenation: Pre-oxygenation with 100% oxygen Induction Type: IV induction Laryngoscope Size: Glidescope and 4 Grade View: Grade I Tube type: Oral Tube size: 7.5 mm Number of attempts: 1 Airway Equipment and Method: Video-laryngoscopy and Rigid stylet Placement Confirmation: ETT inserted through vocal cords under direct vision, positive ETCO2 and breath sounds checked- equal and bilateral Secured at: 23 cm Tube secured with: Tape Dental Injury: Teeth and Oropharynx as per pre-operative assessment

## 2022-05-20 NOTE — H&P (Signed)
Subjective:   Patient is a 67 y.o. male admitted for cervical stenosis. The patient first presented to me with complaints of neck pain, shooting pains in the arm(s), and numbness of the arm(s). Onset of symptoms was several months ago. The pain is described as aching, stabbing, and throbbing and occurs all day. The pain is rated severe, and is located in the neck and radiates to the shoulders. The symptoms have been progressive. Symptoms are exacerbated by extending head backwards, and are relieved by none.  Previous work up includes MRI of cervical spine, results: spinal stenosis.  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    Nerve pain    per patient, both legs   Pneumonia     Past Surgical History:  Procedure Laterality Date   BACK SURGERY     x2   BRONCHIAL BIOPSY  03/15/2021   Procedure: BRONCHIAL BIOPSIES;  Surgeon: Laurin Coder, MD;  Location: [place] [facility];  Service: Pulmonary;;   BRONCHIAL WASHINGS  03/15/2021   Procedure: BRONCHIAL WASHINGS;  Surgeon: Laurin Coder, MD;  Location: [place] [facility];  Service: Pulmonary;;   HERNIA REPAIR     umbilical and right inguinal hernia   INCISION AND DRAINAGE OF WOUND Left 10/27/2016   Procedure: [place];  Surgeon: Meredith Pel, MD;  Location: [place];  Service: Orthopedics;  Laterality: Left;   LESION REMOVAL Left 11/01/2019   Procedure: LEFT FACIAL LESION REMOVAL;  Surgeon: Izora Gala, MD;  Location: [place];  Service: ENT;  Laterality: Left;   RADIOLOGY WITH ANESTHESIA N/A  02/05/2022   Procedure: MRI WITH ANESTHESIA OF CERVICAL SPINE WITHOUT CONTRAST;  Surgeon: Radiologist, Medication, MD;  Location: [place];  Service: Radiology;  Laterality: N/A;   SPINE SURGERY     x2   VIDEO BRONCHOSCOPY N/A 03/15/2021   Procedure: VIDEO BRONCHOSCOPY WITH FLUORO;  Surgeon: Laurin Coder, MD;  Location: [place] [facility];  Service: Pulmonary;  Laterality: N/A;    Allergies  Allergen Reactions   Ivp Dye [Iodinated Contrast Media] Nausea And Vomiting    Severe vomiting   Penicillins Nausea And Vomiting    Social History   Tobacco Use   Smoking status: Former    Types: Cigarettes    Quit date: 03/25/1988    Years since quitting: 34.1    Passive exposure: Past   Smokeless tobacco: Never  Substance Use Topics   Alcohol use: No    History reviewed. No pertinent family history. Prior to Admission medications   Medication Sig Start Date End Date Taking? Authorizing Provider  diclofenac (VOLTAREN) 75 MG EC tablet Take 75 mg by mouth 2 (two) times daily.  02/18/16  Yes [provider]  diclofenac Sodium (VOLTAREN) 1 % GEL Apply 1 application topically daily as needed (back pain).   Yes [provider]  gabapentin (NEURONTIN) 300 MG capsule Take 300 mg by mouth 3 (three) times daily.   Yes [provider]  metFORMIN (GLUCOPHAGE-XR) 500 MG 24 hr tablet Take  500 mg by mouth 2 (two) times daily.   Yes [provider]  oxyCODONE (ROXICODONE) 15 MG immediate release tablet Take 15 mg by mouth 5 (five) times daily.   Yes [provider]  simvastatin (ZOCOR) 20 MG tablet Take 20 mg by mouth every evening.   Yes [provider]  tamsulosin (FLOMAX) 0.4 MG CAPS capsule Take 0.4 mg by mouth daily after supper.   Yes [provider]  blood glucose meter kit and supplies Dispense based on patient and insurance preference. Use up to four times daily as directed. (FOR ICD-10 E11.8). 10/29/16   Johnson, Clanford L, MD  buprenorphine  (BUTRANS) 20 MCG/HR PTWK Place 1 patch onto the skin once a week.    [provider]     Review of Systems  Positive ROS: neg  All other systems have been reviewed and were otherwise negative with the exception of those mentioned in the HPI and as above.  Objective: Vital signs in last 24 hours: Temp:  [99.3 F (37.4 C)] 99.3 F (37.4 C) (02/26 1057) Pulse Rate:  [101] 101 (02/26 1057) Resp:  [20] 20 (02/26 1057) BP: (164)/(91) 164/91 (02/26 1057) SpO2:  [98 %] 98 % (02/26 1057) Weight:  [76.7 kg] 76.7 kg (02/26 1057)  General Appearance: Alert, cooperative, no distress, appears stated age Head: Normocephalic, without obvious abnormality, atraumatic Eyes: PERRL, conjunctiva/corneas clear, EOM's intact      Neck: Supple, symmetrical, trachea midline, Back: Symmetric, no curvature, ROM normal, no CVA tenderness Lungs:  respirations unlabored Heart: Regular rate and rhythm Abdomen: Soft, non-tender Extremities: Extremities normal, atraumatic, no cyanosis or edema Pulses: 2+ and symmetric all extremities Skin: Skin color, texture, turgor normal, no rashes or lesions  NEUROLOGIC:  Mental status: Alert and oriented x4, no aphasia, good attention span, fund of knowledge and memory  Motor Exam - grossly normal Sensory Exam - grossly normal Reflexes: 1+ Coordination - grossly normal Gait - grossly normal Balance - grossly normal Cranial Nerves: I: smell Not tested  II: visual acuity  OS: nl    OD: nl  II: visual fields Full to confrontation  II: pupils Equal, round, reactive to light  III,VII: ptosis None  III,IV,VI: extraocular muscles  Full ROM  V: mastication Normal  V: facial light touch sensation  Normal  V,VII: corneal reflex  Present  VII: facial muscle function - upper  Normal  VII: facial muscle function - lower Normal  VIII: hearing Not tested  IX: soft palate elevation  Normal  IX,X: gag reflex Present  XI: trapezius strength  5/5  XI:  sternocleidomastoid strength 5/5  XI: neck flexion strength  5/5  XII: tongue strength  Normal    Data Review Lab Results  Component Value Date   WBC 4.8 05/20/2022   HGB 12.4 (L) 05/20/2022   HCT 36.2 (L) 05/20/2022   MCV 85.8 05/20/2022   PLT 123 (L) 05/20/2022   Lab Results  Component Value Date   NA 135 03/14/2022   K 4.8 03/14/2022   CL 105 03/14/2022   CO2 26 03/14/2022   BUN 21 03/14/2022   CREATININE 1.59 (H) 03/14/2022   GLUCOSE 142 (H) 03/14/2022   Lab Results  Component Value Date   INR 1.2 05/20/2022    Assessment:   Cervical neck pain with herniated nucleus pulposus/ spondylosis/ stenosis at C3-6. Estimated body mass index is 23.57 kg/m as calculated from the following:   Height as of this encounter: '5\' 11"'$  (1.803 m).  Weight as of this encounter: 76.7 kg.  Patient has failed conservative therapy. Planned surgery : ACDF C3-4 C4-5, C5-6  Plan:   I explained the condition and procedure to the patient and answered any questions.  Patient wishes to proceed with procedure as planned. Understands risks/ benefits/ and expected or typical outcomes.  Eustace Moore 05/20/2022 12:07 PM

## 2022-05-20 NOTE — Op Note (Signed)
05/20/2022  3:16 PM  PATIENT:  Vincent [name]  67 y.o. 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 Decompressive anterior cervical discectomy C3-4 C4-5 C5-6, 2. Anterior cervical arthrodesis C3-4 C4-5 C5-6 utilizing a PTI interbody cage packed with locally harvested morcellized autologous bone graft and I factor, 3. Anterior cervical plating C3-C6 inclusive utilizing a 58 mm ATEC plate  SURGEON:  Sherley Bounds, MD  ASSISTANTS: Glenford Peers, FNP  ANESTHESIA:   General  EBL: 100 ml  Total I/O In: 1000 [I.V.:1000] Out: 100 [Blood:100]  BLOOD ADMINISTERED: none  DRAINS: 7 flat JP  SPECIMEN:  none  INDICATION FOR PROCEDURE: This patient presented with neck pain with bilateral shoulder pain and numbness and tingling in his arms and hands. Imaging showed spondylosis with cervical spinal stenosis C3-4 C4-5 and C5-6. The patient tried conservative measures without relief. Pain was debilitating. Recommended ACDF with plating. Patient understood the risks, benefits, and alternatives and potential outcomes and wished to proceed.  PROCEDURE DETAILS: Patient was brought to the operating room placed under general endotracheal anesthesia. Patient was placed in the supine position on the operating room table. The neck was prepped with Duraprep and draped in a sterile fashion.   Three cc of local anesthesia was injected and a transverse incision was made on the right side of the neck.  Dissection was carried down thru the subcutaneous tissue and the platysma was  elevated, opened, and undermined with Metzenbaum scissors.  Dissection was then carried out thru an avascular plane leaving the sternocleidomastoid carotid artery and jugular vein laterally and the trachea and esophagus medially with the assistance of my nurse practitioner. The ventral aspect of the vertebral  column was identified and a localizing x-ray was taken. The C4-5 level was identified and all in the room agreed with the level. The longus colli muscles were then elevated and the retractor was placed with the assistance of my nurse practitioner to expose C3-4 C4-5 and C5-6.  The annulus was incised and the disc space entered. Discectomy was performed with micro-curettes and pituitary rongeurs. I then used the high-speed drill to drill the endplates down to the level of the posterior longitudinal ligament. The drill shavings were saved in a mucous trap for later arthrodesis. The operating microscope was draped and brought into the field provided additional magnification, illumination and visualization. Discectomy was continued posteriorly thru the disc space. Posterior longitudinal ligament was opened with a nerve hook, and then removed along with disc herniation and osteophytes, decompressing the spinal canal and thecal sac. We then continued to remove osteophytic overgrowth and disc material decompressing the neural foramina and exiting nerve roots bilaterally. The scope was angled up and down to help decompress and undercut the vertebral bodies. Once the decompression was completed we could pass a nerve hook circumferentially to assure adequate decompression in the midline and in the neural foramina. So by both visualization and palpation we felt we had an adequate decompression of the neural elements. We then measured the height of the intravertebral disc space and selected a 7 x 16 x 18  millimeter PTI interbody cage packed with autograft and I factor each interspace. It was then gently positioned in the 3 intravertebral disc space(s) and countersunk. I then used a 58 mm plate and placed 16 mm variable angle screws into the vertebral bodies of each level and locked them into position. The wound  was irrigated with bacitracin solution, checked for hemostasis which was established and confirmed. Once meticulous  hemostasis was achieved, we then placed a 7 flat JP drain through a separate stab incision and proceeded with closure with the assistance of my nurse practitioner. The platysma was closed with interrupted 3-0 undyed Vicryl suture, the subcuticular layer was closed with interrupted 3-0 undyed Vicryl suture. The skin edges were approximated with steristrips. The drapes were removed. A sterile dressing was applied. The patient was then awakened from general anesthesia and transferred to the recovery room in stable condition. At the end of the procedure all sponge, needle and instrument counts were correct.   PLAN OF CARE: Admit to inpatient   PATIENT DISPOSITION:  PACU - hemodynamically stable.   Delay start of Pharmacological VTE agent (>24hrs) due to surgical blood loss or risk of bleeding:  yes

## 2022-05-20 NOTE — Progress Notes (Signed)
Pharmacy Antibiotic Note  Vincent [name] is a 67 y.o. male admitted on 05/20/2022 s/p procedure for cervical spinal stenosis, drain placed. Pharmacy has been consulted for vancomycin dosing for surgical prophylaxis - pre-op 1g IV dose given at 1134 today. SCr 1.65 on 05/20/22 (appears to be near baseline).  Plan: Vancomycin 750 mg IV q12h Monitor clinical progress, c/s, renal function, vancomycin levels as indicated F/u drain removal and vancomycin LOT   Height: '5\' 11"'$  (180.3 cm) Weight: 76.7 kg (169 lb) IBW/kg (Calculated) : 75.3  Temp (24hrs), Avg:98.3 F (36.8 C), Min:97.6 F (36.4 C), Max:99.3 F (37.4 C)  Recent Labs  Lab 05/20/22 1115  WBC 4.8  CREATININE 1.65*    Estimated Creatinine Clearance: 46.9 mL/min (A) (by C-G formula based on SCr of 1.65 mg/dL (H)).    Allergies  Allergen Reactions   Ivp Dye [Iodinated Contrast Media] Nausea And Vomiting    Severe vomiting   Penicillins Nausea And Vomiting    Arturo Morton, PharmD, BCPS Please check AMION for all [place] contact numbers Clinical Pharmacist 05/20/2022 5:24 PM

## 2022-05-20 NOTE — Progress Notes (Signed)
Patient in short stay states he had coffee and a strawberry pastry morning of surgery. Dr. Kalman Shan notified. No new orders placed.

## 2022-05-21 ENCOUNTER — Encounter (HOSPITAL_COMMUNITY): Payer: Self-pay | Admitting: Neurological Surgery

## 2022-05-21 LAB — GLUCOSE, CAPILLARY: Glucose-Capillary: 121 mg/dL — ABNORMAL HIGH (ref 70–99)

## 2022-05-21 MED ORDER — METHOCARBAMOL 750 MG PO TABS: 750.0000 mg | ORAL_TABLET | Freq: Four times a day (QID) | ORAL | 0 refills | Status: DC

## 2022-05-21 MED ORDER — OXYCODONE-ACETAMINOPHEN 10-325 MG PO TABS: 1.0000 | ORAL_TABLET | ORAL | 0 refills | Status: DC | PRN

## 2022-05-21 MED ORDER — INSULIN ASPART 100 UNIT/ML IJ SOLN: 0.0000 [IU] | Freq: Three times a day (TID) | INTRAMUSCULAR | Status: DC

## 2022-05-21 MED FILL — Thrombin For Soln 5000 Unit: CUTANEOUS | Qty: 5000 | Status: AC

## 2022-05-21 NOTE — Discharge Summary (Signed)
Physician Discharge Summary  Patient ID: Vincent [name] MRN: [id number] DOB/AGE: 1955/09/27 67 y.o.  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 The patient tolerated the procedure well and was taken to the recovery room and then to the floor in stable condition. The hospital course was routine. There were no complications. The wound remained clean dry and intact. Pt had appropriate neck soreness. No complaints of arm pain or new N/T/W. The patient remained afebrile with stable vital signs, and tolerated a regular diet. The patient continued to increase activities, and pain was well controlled with oral pain medications.   Consults: None  Significant Diagnostic Studies:  Results for orders placed or performed during the hospital encounter of 05/20/22  Surgical pcr screen   Specimen: Nasal Mucosa; Nasal Swab  Result Value Ref Range   MRSA, PCR NEGATIVE NEGATIVE   Staphylococcus aureus NEGATIVE NEGATIVE  Basic metabolic panel per protocol  Result Value Ref Range   Sodium 137 135 - 145 mmol/L   Potassium 3.9 3.5 - 5.1 mmol/L   Chloride 102 98 - 111 mmol/L   CO2 22 22 - 32 mmol/L   Glucose, Bld 210 (H) 70 - 99 mg/dL   BUN 18 8 - 23 mg/dL   Creatinine, Ser 1.65 (H) 0.61 - 1.24 mg/dL   Calcium 9.2 8.9 - 10.3 mg/dL   GFR, Estimated 46 (L) >60 mL/min   Anion gap 13 5 - 15  CBC per protocol  Result Value Ref Range   WBC 4.8 4.0 - 10.5 K/uL   RBC 4.22 4.22 - 5.81 MIL/uL   Hemoglobin 12.4 (L) 13.0 - 17.0 g/dL   HCT 36.2 (L) 39.0 - 52.0 %   MCV 85.8 80.0 - 100.0 fL   MCH 29.4 26.0 - 34.0 pg   MCHC 34.3 30.0 - 36.0 g/dL   RDW 13.2 11.5 - 15.5 %   Platelets 123 (L) 150 - 400  K/uL   nRBC 0.0 0.0 - 0.2 %  Protime-INR  Result Value Ref Range   Prothrombin Time 14.9 11.4 - 15.2 seconds   INR 1.2 0.8 - 1.2  Glucose, capillary  Result Value Ref Range   Glucose-Capillary 221 (H) 70 - 99 mg/dL  Glucose, capillary  Result Value Ref Range   Glucose-Capillary 113 (H) 70 - 99 mg/dL  Glucose, capillary  Result Value Ref Range   Glucose-Capillary 182 (H) 70 - 99 mg/dL   Comment 1 Notify RN    Comment 2 Document in Chart   Glucose, capillary  Result Value Ref Range   Glucose-Capillary 210 (H) 70 - 99 mg/dL   Comment 1 Notify RN    Comment 2 Document in Chart   Glucose, capillary  Result Value Ref Range   Glucose-Capillary 121 (H) 70 - 99 mg/dL   Comment 1 Notify RN    Comment 2 Document in Chart   Type and screen [place]  Result Value Ref Range   ABO/RH(D) O POS    Antibody Screen NEG    Sample Expiration      05/23/2022,2359 Performed at [facility], [address] [place], [place] [address]     DG Cervical Spine 1 View  Result  Date: 05/20/2022 CLINICAL DATA:  Anterior cervical discectomy and fusion of C3-6 EXAM: DG CERVICAL SPINE - 1 VIEW COMPARISON:  Cervical spine radiograph dated 06/12/2016 FINDINGS: One fluoroscopic image obtained during anterior cervical discectomy and fusion. 12.9 seconds fluoro time utilized. Radiation dose 1.0622 mGy Kerma. Please see performing physicians operative report for full details IMPRESSION: Fluoroscopic images were obtained for intraoperative guidance of anterior cervical discectomy and fusion. Electronically Signed   By: Darrin Nipper M.D.   On: 05/20/2022 15:26   DG C-Arm 1-60 Min-No Report  Result Date: 05/20/2022 Fluoroscopy was utilized by the requesting physician.  No radiographic interpretation.   DG C-Arm 1-60 Min-No Report  Result Date: 05/20/2022 Fluoroscopy was utilized by the requesting physician.  No radiographic interpretation.   DG C-Arm 1-60 Min-No Report  Result Date:  05/20/2022 Fluoroscopy was utilized by the requesting physician.  No radiographic interpretation.    Antibiotics:  Anti-infectives (From admission, onward)    Start     Dose/Rate Route Frequency Ordered Stop   05/20/22 2300  vancomycin (VANCOREADY) IVPB 750 mg/150 mL        750 mg 150 mL/hr over 60 Minutes Intravenous Every 12 hours 05/20/22 1725     05/20/22 1100  vancomycin (VANCOCIN) IVPB 1000 mg/200 mL premix        1,000 mg 200 mL/hr over 60 Minutes Intravenous On call to O.R. 05/20/22 1050 05/20/22 1234       Discharge Exam: Blood pressure (!) 111/58, pulse 82, temperature 98.5 F (36.9 C), temperature source Oral, resp. rate 20, height '5\' 11"'$  (1.803 m), weight 76.7 kg, SpO2 96 %. Neurologic: Grossly normal Ambulating and voiding well incision cdi   Discharge Medications:   Allergies as of 05/21/2022       Reactions   Ivp Dye [iodinated Contrast Media] Nausea And Vomiting   Severe vomiting   Penicillins Nausea And Vomiting        Medication List     STOP taking these medications    buprenorphine 20 MCG/HR Ptwk Commonly known as: BUTRANS   oxyCODONE 15 MG immediate release tablet Commonly known as: ROXICODONE       TAKE these medications    blood glucose meter kit and supplies Dispense based on patient and insurance preference. Use up to four times daily as directed. (FOR ICD-10 E11.8).   diclofenac 75 MG EC tablet Commonly known as: VOLTAREN Take 75 mg by mouth 2 (two) times daily.   diclofenac Sodium 1 % Gel Commonly known as: VOLTAREN Apply 1 application topically daily as needed (back pain).   gabapentin 300 MG capsule Commonly known as: NEURONTIN Take 300 mg by mouth 3 (three) times daily.   metFORMIN 500 MG 24 hr tablet Commonly known as: GLUCOPHAGE-XR Take 500 mg by mouth 2 (two) times daily.   methocarbamol 750 MG tablet Commonly known as: Robaxin-750 Take 1 tablet (750 mg total) by mouth 4 (four) times daily.    oxyCODONE-acetaminophen 10-325 MG tablet Commonly known as: Percocet Take 1 tablet by mouth every 4 (four) hours as needed for pain.   simvastatin 20 MG tablet Commonly known as: ZOCOR Take 20 mg by mouth every evening.   tamsulosin 0.4 MG Caps capsule Commonly known as: FLOMAX Take 0.4 mg by mouth daily after supper.        Disposition: home   Final Dx: ACDF c3-C6  Discharge Instructions      Remove dressing in 72 hours   Complete by: As directed    Call MD  for:  difficulty breathing, headache or visual disturbances   Complete by: As directed    Call MD for:  hives   Complete by: As directed    Call MD for:  persistant dizziness or light-headedness   Complete by: As directed    Call MD for:  persistant nausea and vomiting   Complete by: As directed    Call MD for:  redness, tenderness, or signs of infection (pain, swelling, redness, odor or green/yellow discharge around incision site)   Complete by: As directed    Call MD for:  severe uncontrolled pain   Complete by: As directed    Call MD for:  temperature >100.4   Complete by: As directed    Diet - low sodium heart healthy   Complete by: As directed    Driving Restrictions   Complete by: As directed    No driving for 2 weeks, no riding in the car for 1 week   Increase activity slowly   Complete by: As directed    Lifting restrictions   Complete by: As directed    No lifting more than 8 lbs          Signed: Ocie Cornfield [facility] 05/21/2022, 7:49 AM

## 2022-05-21 NOTE — Evaluation (Signed)
Occupational Therapy Evaluation and Discharge Patient Details Name: Vincent [name] MRN: [id number] DOB: Jul 29, 1955 Today's Date: 05/21/2022   History of Present Illness Pt is a 67 yo male s/p decompressive anterior cervical discectomyand fusion C3-4 C4-5 C5-6 due to neck pain with bilateral shoulder pain and numbness and tingling in his arms and hands. Imaging showed spondylosis with cervical spinal stenosis C3-4 C4-5 and C5-6.   Clinical Impression   This 67 yo male admitted and underwent above presents to acute OT with all education completed and post op cervical surgery handout provided. We will D/C from acute OT.      Recommendations for follow up therapy are one component of a multi-disciplinary discharge planning process, led by the attending physician.  Recommendations may be updated based on patient status, additional functional criteria and insurance authorization.   Follow Up Recommendations  No OT follow up     Assistance Recommended at Discharge None  Patient can return home with the following Assistance with cooking/housework;Assist for transportation    Functional Status Assessment  Patient has had a recent decline in their functional status and demonstrates the ability to make significant improvements in function in a reasonable and predictable amount of time. (without further need for OT services, all education completed)  Equipment Recommendations  None recommended by OT       Precautions / Restrictions Precautions Precautions: Cervical Precaution Booklet Issued: Yes (comment) Required Braces or Orthoses:  (no collar) Restrictions Weight Bearing Restrictions: No      Mobility Bed Mobility Overal bed mobility: Modified Independent                  Transfers Overall transfer level: Independent                        Balance Overall balance assessment: Independent                                         ADL either  performed or assessed with clinical judgement   ADL                                         General ADL Comments: Able to independently get himself dressed post instruction.Educated on sequence of dressing/undressing, use of straws when drinking from cups, use of 2 cups for brushing teeth/mouth care (one spit and one rinse with straw in it), bed mobility, being careful when i shower to not bend or twist his neck too much, avoiding extreme movements of his neck given no collar.     Vision Patient Visual Report: No change from baseline              Pertinent Vitals/Pain Pain Assessment Pain Assessment: 0-10 Pain Score: 8  Pain Location: incisional Pain Descriptors / Indicators: Sore, Aching Pain Intervention(s): Limited activity within patient's tolerance, Monitored during session, Repositioned, Premedicated before session     Hand Dominance Right   Extremity/Trunk Assessment Upper Extremity Assessment Upper Extremity Assessment: Overall WFL for tasks assessed           Communication Communication Communication: No difficulties   Cognition Arousal/Alertness: Awake/alert Behavior During Therapy: WFL for tasks assessed/performed Overall Cognitive Status: Within Functional Limits for tasks assessed  Home Living Family/patient expects to be discharged to:: Private residence Living Arrangements: Alone Available Help at Discharge: Friend(s);Family;Available PRN/intermittently Type of Home: House       Home Layout: One level     Bathroom Shower/Tub: Tub/shower unit;Curtain   Biochemist, clinical: Standard     Home Equipment: None          Prior Functioning/Environment Prior Level of Function : Independent/Modified Independent                        OT Problem List: Pain         OT Goals(Current goals can be found in the care plan section) Acute Rehab OT  Goals Patient Stated Goal: to get a ride to go home         AM-PAC OT "6 Clicks" Daily Activity     Outcome Measure Help from another person eating meals?: None Help from another person taking care of personal grooming?: None Help from another person toileting, which includes using toliet, bedpan, or urinal?: None Help from another person bathing (including washing, rinsing, drying)?: None Help from another person to put on and taking off regular upper body clothing?: None Help from another person to put on and taking off regular lower body clothing?: None 6 Click Score: 24   End of Session Nurse Communication:  (pt ready to go home from OT standpoint)  Activity Tolerance: Patient tolerated treatment well Patient left: in chair;with call bell/phone within reach  OT Visit Diagnosis: Pain Pain - part of body:  (neck)                Time: [id number] OT Time Calculation (min): 18 min Charges:  OT General Charges $OT Visit: 1 Visit OT Evaluation $OT Eval Moderate Complexity: 1 Mod  [place] Office [phone]    Almon Register 05/21/2022, 10:39 AM

## 2022-05-21 NOTE — Plan of Care (Signed)
  Problem: Education: Goal: Ability to verbalize activity precautions or restrictions will improve Outcome: Completed/Met Goal: Knowledge of the prescribed therapeutic regimen will improve Outcome: Completed/Met Goal: Understanding of discharge needs will improve Outcome: Completed/Met   Problem: Activity: Goal: Ability to avoid complications of mobility impairment will improve Outcome: Completed/Met Goal: Ability to tolerate increased activity will improve Outcome: Completed/Met Goal: Will remain free from falls Outcome: Completed/Met   Problem: Bowel/Gastric: Goal: Gastrointestinal status for postoperative course will improve Outcome: Completed/Met   Problem: Clinical Measurements: Goal: Ability to maintain clinical measurements within normal limits will improve Outcome: Completed/Met Goal: Postoperative complications will be avoided or minimized Outcome: Completed/Met Goal: Diagnostic test results will improve Outcome: Completed/Met   Problem: Pain Management: Goal: Pain level will decrease Outcome: Completed/Met   Problem: Skin Integrity: Goal: Will show signs of wound healing Outcome: Completed/Met   Problem: Health Behavior/Discharge Planning: Goal: Identification of resources available to assist in meeting health care needs will improve Outcome: Completed/Met   Problem: Bladder/Genitourinary: Goal: Urinary functional status for postoperative course will improve Outcome: Completed/Met  Patient alert and oriented, void, ambulate, VSS, surgical site clean and dry. D/c instructions explain and copy given to the patient all questions answered. Pt. D/c home per order.

## 2022-05-23 NOTE — Anesthesia Postprocedure Evaluation (Signed)
Anesthesia Post Note  Patient: Vincent [name]  Procedure(s) Performed: ACDF - C3-C4 - C4-C5 - C5-C6 (Neck)     Patient location during evaluation: PACU Anesthesia Type: General Level of consciousness: awake and alert Pain management: pain level controlled Vital Signs Assessment: post-procedure vital signs reviewed and stable Respiratory status: spontaneous breathing, nonlabored ventilation, respiratory function stable and patient connected to nasal cannula oxygen Cardiovascular status: blood pressure returned to baseline and stable Postop Assessment: no apparent nausea or vomiting Anesthetic complications: no  No notable events documented.  Last Vitals:  Vitals:   05/21/22 0554 05/21/22 0803  BP: (!) 111/58 132/66  Pulse: 82 84  Resp: 20 18  Temp: 36.9 C 36.8 C  SpO2: 96% 98%    Last Pain:  Vitals:   05/21/22 0803  TempSrc: Oral  PainSc:                  Vincent [name] S

## 2022-07-09 ENCOUNTER — Other Ambulatory Visit: Payer: Self-pay | Admitting: Neurological Surgery

## 2022-07-09 DIAGNOSIS — M4316 Spondylolisthesis, lumbar region: Secondary | ICD-10-CM

## 2022-08-12 ENCOUNTER — Other Ambulatory Visit: Payer: PRIVATE HEALTH INSURANCE

## 2022-09-23 ENCOUNTER — Ambulatory Visit
Admission: RE | Admit: 2022-09-23 | Discharge: 2022-09-23 | Disposition: A | Discharge status: Home / Self Care | Payer: Medicare Other | Source: Ambulatory Visit | Attending: Neurological Surgery | Admitting: Neurological Surgery

## 2022-09-23 DIAGNOSIS — M4316 Spondylolisthesis, lumbar region: Secondary | ICD-10-CM

## 2022-09-23 MED ORDER — GADOPICLENOL 0.5 MMOL/ML IV SOLN
8.0000 mL | Freq: Once | INTRAVENOUS | Status: AC | PRN
  Administered 2022-09-23: 8 mL via INTRAVENOUS

## 2023-01-24 ENCOUNTER — Other Ambulatory Visit: Payer: Self-pay | Admitting: Neurological Surgery

## 2023-02-27 NOTE — Progress Notes (Signed)
Surgical Instructions   Your procedure is scheduled on Wednesday December 11. Report to [facility] Main Entrance "A" at 1010 A.M., then check in with the Admitting office. Any questions or running late day of surgery: call [phone]  Questions prior to your surgery date: call [phone], Monday-Friday, 8am-4pm. If you experience any cold or flu symptoms such as cough, fever, chills, shortness of breath, etc. between now and your scheduled surgery, please notify us at the above number.     Remember:  Do not eat or drink anything after midnight the night before your surgery.   Take these medicines the morning of surgery with A SIP OF WATER  gabapentin (NEURONTIN)   May take these medicines IF NEEDED: oxyCODONE (ROXICODONE)   One week prior to surgery, STOP taking any Aspirin (unless otherwise instructed by your surgeon),diclofenac (VOLTAREN) tablets,diclofenac Sodium (VOLTAREN) 1 % GEL , Aleve, Naproxen, Ibuprofen, Motrin, Advil, Goody's, BC's, all herbal medications, fish oil, and non-prescription vitamins.  WHAT DO I DO ABOUT MY DIABETES MEDICATION?   Do not take oral diabetes medicines (pills) the morning of surgery. DO NOT TAKE metFORMIN (GLUCOPHAGE-XR) THE MORNING OF SURGERY.    HOW TO MANAGE YOUR DIABETES BEFORE AND AFTER SURGERY  Why is it important to control my blood sugar before and after surgery? Improving blood sugar levels before and after surgery helps healing and can limit problems. A way of improving blood sugar control is eating a healthy diet by:  Eating less sugar and carbohydrates  Increasing activity/exercise  Talking with your doctor about reaching your blood sugar goals High blood sugars (greater than 180 mg/dL) can raise your risk of infections and slow your recovery, so you will need to focus on controlling your diabetes during the weeks before surgery. Make sure that the doctor who takes care of your diabetes knows about your planned surgery  including the date and location.  How do I manage my blood sugar before surgery? Check your blood sugar at least 4 times a day, starting 2 days before surgery, to make sure that the level is not too high or low.  Check your blood sugar the morning of your surgery when you wake up and every 2 hours until you get to the Short Stay unit.  If your blood sugar is less than 70 mg/dL, you will need to treat for low blood sugar: Do not take insulin. Treat a low blood sugar (less than 70 mg/dL) with  cup of clear juice (cranberry or apple), 4 glucose tablets, OR glucose gel. Recheck blood sugar in 15 minutes after treatment (to make sure it is greater than 70 mg/dL). If your blood sugar is not greater than 70 mg/dL on recheck, call [phone] for further instructions. Report your blood sugar to the short stay nurse when you get to Short Stay.  If you are admitted to the hospital after surgery: Your blood sugar will be checked by the staff and you will probably be given insulin after surgery (instead of oral diabetes medicines) to make sure you have good blood sugar levels. The goal for blood sugar control after surgery is 80-180 mg/dL.                     Do NOT Smoke (Tobacco/Vaping) for 24 hours prior to your procedure.  If you use a CPAP at night, you may bring your mask/headgear for your overnight stay.   You will be asked to remove any contacts, glasses, piercing's, hearing aid's, dentures/partials prior  to surgery. Please bring cases for these items if needed.    Patients discharged the day of surgery will not be allowed to drive home, and someone needs to stay with them for 24 hours.  SURGICAL WAITING ROOM VISITATION Patients may have no more than 2 support people in the waiting area - these visitors may rotate.   Pre-op nurse will coordinate an appropriate time for 1 ADULT support person, who may not rotate, to accompany patient in pre-op.  Children under the age of 22 must have an  adult with them who is not the patient and must remain in the main waiting area with an adult.  If the patient needs to stay at the hospital during part of their recovery, the visitor guidelines for inpatient rooms apply.  Please refer to the [facility] website for the visitor guidelines for any additional information.   If you received a COVID test during your pre-op visit  it is requested that you wear a mask when out in public, stay away from anyone that may not be feeling well and notify your surgeon if you develop symptoms. If you have been in contact with anyone that has tested positive in the last 10 days please notify you surgeon.      Pre-operative 5 CHG Bathing Instructions   You can play a key role in reducing the risk of infection after surgery. Your skin needs to be as free of germs as possible. You can reduce the number of germs on your skin by washing with CHG (chlorhexidine gluconate) soap before surgery. CHG is an antiseptic soap that kills germs and continues to kill germs even after washing.   DO NOT use if you have an allergy to chlorhexidine/CHG or antibacterial soaps. If your skin becomes reddened or irritated, stop using the CHG and notify one of our RNs at [phone].   Please shower with the CHG soap starting 4 days before surgery using the following schedule:     Please keep in mind the following:  DO NOT shave, including legs and underarms, starting the day of your first shower.   You may shave your face at any point before/day of surgery.  Place clean sheets on your bed the day you start using CHG soap. Use a clean washcloth (not used since being washed) for each shower. DO NOT sleep with pets once you start using the CHG.   CHG Shower Instructions:  Wash your face and private area with normal soap. If you choose to wash your hair, wash first with your normal shampoo.  After you use shampoo/soap, rinse your hair and body thoroughly to remove shampoo/soap  residue.  Turn the water OFF and apply about 3 tablespoons (45 ml) of CHG soap to a CLEAN washcloth.  Apply CHG soap ONLY FROM YOUR NECK DOWN TO YOUR TOES (washing for 3-5 minutes)  DO NOT use CHG soap on face, private areas, open wounds, or sores.  Pay special attention to the area where your surgery is being performed.  If you are having back surgery, having someone wash your back for you may be helpful. Wait 2 minutes after CHG soap is applied, then you may rinse off the CHG soap.  Pat dry with a clean towel  Put on clean clothes/pajamas   If you choose to wear lotion, please use ONLY the CHG-compatible lotions on the back of this paper.   Additional instructions for the day of surgery: DO NOT APPLY any lotions, deodorants, cologne, or  perfumes.   Do not bring valuables to the hospital. [facility] is not responsible for any belongings/valuables. Do not wear nail polish, gel polish, artificial nails, or any other type of covering on natural nails (fingers and toes) Do not wear jewelry or makeup Put on clean/comfortable clothes.  Please brush your teeth.  Ask your nurse before applying any prescription medications to the skin.     CHG Compatible Lotions   Aveeno Moisturizing lotion  Cetaphil Moisturizing Cream  Cetaphil Moisturizing Lotion  Clairol Herbal Essence Moisturizing Lotion, Dry Skin  Clairol Herbal Essence Moisturizing Lotion, Extra Dry Skin  Clairol Herbal Essence Moisturizing Lotion, Normal Skin  Curel Age Defying Therapeutic Moisturizing Lotion with Alpha Hydroxy  Curel Extreme Care Body Lotion  Curel Soothing Hands Moisturizing Hand Lotion  Curel Therapeutic Moisturizing Cream, Fragrance-Free  Curel Therapeutic Moisturizing Lotion, Fragrance-Free  Curel Therapeutic Moisturizing Lotion, Original Formula  Eucerin Daily Replenishing Lotion  Eucerin Dry Skin Therapy Plus Alpha Hydroxy Crme  Eucerin Dry Skin Therapy Plus Alpha Hydroxy Lotion  Eucerin Original  Crme  Eucerin Original Lotion  Eucerin Plus Crme Eucerin Plus Lotion  Eucerin TriLipid Replenishing Lotion  Keri Anti-Bacterial Hand Lotion  Keri Deep Conditioning Original Lotion Dry Skin Formula Softly Scented  Keri Deep Conditioning Original Lotion, Fragrance Free Sensitive Skin Formula  Keri Lotion Fast Absorbing Fragrance Free Sensitive Skin Formula  Keri Lotion Fast Absorbing Softly Scented Dry Skin Formula  Keri Original Lotion  Keri Skin Renewal Lotion Keri Silky Smooth Lotion  Keri Silky Smooth Sensitive Skin Lotion  Nivea Body Creamy Conditioning Oil  Nivea Body Extra Enriched Lotion  Nivea Body Original Lotion  Nivea Body Sheer Moisturizing Lotion Nivea Crme  Nivea Skin Firming Lotion  NutraDerm 30 Skin Lotion  NutraDerm Skin Lotion  NutraDerm Therapeutic Skin Cream  NutraDerm Therapeutic Skin Lotion  ProShield Protective Hand Cream  Provon moisturizing lotion  Please read over the following fact sheets that you were given.

## 2023-02-28 ENCOUNTER — Other Ambulatory Visit: Payer: Self-pay

## 2023-02-28 ENCOUNTER — Encounter (HOSPITAL_COMMUNITY): Payer: Self-pay

## 2023-02-28 ENCOUNTER — Encounter (HOSPITAL_COMMUNITY)
Admission: RE | Admit: 2023-02-28 | Discharge: 2023-02-28 | Disposition: A | Discharge status: Home / Self Care | Payer: Medicare Other | Source: Ambulatory Visit | Attending: Neurological Surgery | Admitting: Neurological Surgery

## 2023-02-28 VITALS — BP 95/65 | HR 95 | Temp 98.2°F | Resp 17 | Ht 70.0 in | Wt 166.0 lb

## 2023-02-28 DIAGNOSIS — Z79899 Other long term (current) drug therapy: Secondary | ICD-10-CM | POA: Diagnosis not present

## 2023-02-28 DIAGNOSIS — E119 Type 2 diabetes mellitus without complications: Secondary | ICD-10-CM | POA: Diagnosis not present

## 2023-02-28 DIAGNOSIS — Z87891 Personal history of nicotine dependence: Secondary | ICD-10-CM | POA: Diagnosis not present

## 2023-02-28 DIAGNOSIS — M4316 Spondylolisthesis, lumbar region: Secondary | ICD-10-CM | POA: Diagnosis not present

## 2023-02-28 DIAGNOSIS — Z9889 Other specified postprocedural states: Secondary | ICD-10-CM | POA: Diagnosis not present

## 2023-02-28 DIAGNOSIS — Z01812 Encounter for preprocedural laboratory examination: Secondary | ICD-10-CM | POA: Diagnosis present

## 2023-02-28 DIAGNOSIS — Z7984 Long term (current) use of oral hypoglycemic drugs: Secondary | ICD-10-CM | POA: Insufficient documentation

## 2023-02-28 DIAGNOSIS — G4733 Obstructive sleep apnea (adult) (pediatric): Secondary | ICD-10-CM | POA: Diagnosis not present

## 2023-02-28 DIAGNOSIS — N189 Chronic kidney disease, unspecified: Secondary | ICD-10-CM | POA: Insufficient documentation

## 2023-02-28 DIAGNOSIS — Z85828 Personal history of other malignant neoplasm of skin: Secondary | ICD-10-CM | POA: Diagnosis not present

## 2023-02-28 DIAGNOSIS — Z01818 Encounter for other preprocedural examination: Secondary | ICD-10-CM

## 2023-02-28 DIAGNOSIS — E1122 Type 2 diabetes mellitus with diabetic chronic kidney disease: Secondary | ICD-10-CM | POA: Diagnosis not present

## 2023-02-28 LAB — PROTIME-INR
INR: 1.2 (ref 0.8–1.2)
Prothrombin Time: 15 s (ref 11.4–15.2)

## 2023-02-28 LAB — HEMOGLOBIN A1C
Hgb A1c MFr Bld: 5.7 % — ABNORMAL HIGH (ref 4.8–5.6)
Mean Plasma Glucose: 116.89 mg/dL

## 2023-02-28 LAB — TYPE AND SCREEN
ABO/RH(D): O POS
Antibody Screen: NEGATIVE

## 2023-02-28 LAB — BASIC METABOLIC PANEL
Anion gap: 8 (ref 5–15)
BUN: 34 mg/dL — ABNORMAL HIGH (ref 8–23)
CO2: 24 mmol/L (ref 22–32)
Calcium: 8.4 mg/dL — ABNORMAL LOW (ref 8.9–10.3)
Chloride: 105 mmol/L (ref 98–111)
Creatinine, Ser: 1.99 mg/dL — ABNORMAL HIGH (ref 0.61–1.24)
GFR, Estimated: 36 mL/min — ABNORMAL LOW (ref >60)
Glucose, Bld: 114 mg/dL — ABNORMAL HIGH (ref 70–99)
Potassium: 4.4 mmol/L (ref 3.5–5.1)
Sodium: 137 mmol/L (ref 135–145)

## 2023-02-28 LAB — CBC
HCT: 30.3 % — ABNORMAL LOW (ref 39.0–52.0)
Hemoglobin: 10.1 g/dL — ABNORMAL LOW (ref 13.0–17.0)
MCH: 27.8 pg (ref 26.0–34.0)
MCHC: 33.3 g/dL (ref 30.0–36.0)
MCV: 83.5 fL (ref 80.0–100.0)
Platelets: 154 10*3/uL (ref 150–400)
RBC: 3.63 MIL/uL — ABNORMAL LOW (ref 4.22–5.81)
RDW: 14 % (ref 11.5–15.5)
WBC: 5.9 10*3/uL (ref 4.0–10.5)
nRBC: 0 % (ref 0.0–0.2)

## 2023-02-28 LAB — SURGICAL PCR SCREEN
MRSA, PCR: POSITIVE — AB
Staphylococcus aureus: POSITIVE — AB

## 2023-02-28 LAB — GLUCOSE, CAPILLARY: Glucose-Capillary: 124 mg/dL — ABNORMAL HIGH (ref 70–99)

## 2023-02-28 NOTE — Progress Notes (Signed)
PCP - Dr. Windle Guard Cardiologist - denies  PPM/ICD - denies   Chest x-ray - 03/15/21 EKG - 03/14/22 Stress Test - denies ECHO - denies Cardiac Cath - denies  Sleep Study - denies   DM- pt does not check CBG at home and does not know typical fasting levels  Last dose of GLP1 agonist-  n/a   ASA/Blood Thinner Instructions: n/a   ERAS Protcol - no, NPO   COVID TEST- n/a   Anesthesia review: yes, Revonda Standard has written 2 notes on this pt previously.  Patient denies shortness of breath, fever, cough and chest pain at PAT appointment   All instructions explained to the patient, with a verbal understanding of the material. Patient agrees to go over the instructions while at home for a better understanding.  The opportunity to ask questions was provided.

## 2023-03-03 NOTE — Anesthesia Preprocedure Evaluation (Signed)
Anesthesia Evaluation  Patient identified by MRN, date of birth, ID band Patient awake    Reviewed: Allergy & Precautions, H&P , NPO status , Patient's Chart, lab work & pertinent test results  Airway Mallampati: I  TM Distance: >3 FB Neck ROM: Full    Dental no notable dental hx. (+) Poor Dentition, Dental Advisory Given   Pulmonary sleep apnea , former smoker   Pulmonary exam normal breath sounds clear to auscultation       Cardiovascular hypertension,  Rhythm:Regular Rate:Normal     Neuro/Psych negative neurological ROS  negative psych ROS   GI/Hepatic negative GI ROS, Neg liver ROS,,,  Endo/Other  diabetes, Type 2, Oral Hypoglycemic Agents    Renal/GU Renal InsufficiencyRenal disease  negative genitourinary   Musculoskeletal  (+) Arthritis , Osteoarthritis,    Abdominal   Peds  Hematology negative hematology ROS (+)   Anesthesia Other Findings   Reproductive/Obstetrics negative OB ROS                             Anesthesia Physical Anesthesia Plan  ASA: 3  Anesthesia Plan: General   Post-op Pain Management: Tylenol PO (pre-op)*, Ketamine IV* and Lidocaine infusion*   Induction: Intravenous  PONV Risk Score and Plan: 3 and Ondansetron, Dexamethasone and Midazolam  Airway Management Planned: Oral ETT  Additional Equipment:   Intra-op Plan:   Post-operative Plan: Extubation in OR  Informed Consent: I have reviewed the patients History and Physical, chart, labs and discussed the procedure including the risks, benefits and alternatives for the proposed anesthesia with the patient or authorized representative who has indicated his/her understanding and acceptance.     Dental advisory given  Plan Discussed with: CRNA  Anesthesia Plan Comments: (PAT note written 03/03/2023 by Shonna Chock, PA-C.  )       Anesthesia Quick Evaluation

## 2023-03-03 NOTE — Progress Notes (Signed)
Anesthesia Chart Review:  Case: [id number] Date/Time: 03/05/23 1155   Procedure: PLIF - L3-L4 - Posterior Lateral and Interbody fusion (Back)   Anesthesia type: General   Pre-op diagnosis: Spondylolisthesis   [facility] ROOM 18 / [facility] OR   Surgeons: Arman Bogus, MD       DISCUSSION: Patient is a 67 year old male scheduled for the above procedure. He was actually scheduled for this procedure on 01/09/22 but it was postponed due to dermatitis. Cervical spine symptoms then progressed, and he underwent C3-6 ACDF on 05/20/22.    History includes former smoker (quit 03/25/88), childhood murmur, DM2, CKD, OSA (does not have CPAP), skin cancer (s/p left facial BCC excision, rotation flp 11/03/19), lumbar surgeries (x2), PNA (RLL cavitary pneumonia due to Streptococcus intermedius 02/2021), spinal surgery C3-6 ACDF 05/20/22). He reported intraoperative bleeding during his first back surgery around age 33 (see below).    I evaluated him on 10/27/19 prior to a previous surgery given his reported childhood murmur and intra-operative bleeding history. I did not hear a murmur at that time. Per my previously note, "In regards to his intraoperative bleeding history, he initially reported that he and his mother were "free bleeders". However, he denied ever seeing a hematologist or formal diagnosis of a specific bleeding disorder.  To his knowledge, he is never required pre-operative DDVT, PLT, or clotting factors.  When asked where the "free bleeder" term came from, he said that it was his own term used to describe how his ex-wife explained his bleeding during his first back surgery around age 13 at [facility] by Dr. Jonne Ply. He said that 5-6 months after his surgery, she told him that Dr. Roxan Hockey had told her that they "almost lost him" during surgery because they couldn't find where he was bleeding. He went on to have subsequent surgeries including another back surgery at [facility] around age 69 also by Dr. Roxan Hockey,  left inguinal hernia repair, umbilitcal hernia repair ~ 2006, and I&D of left patella 10/27/16 for infection following knee aspiration without known bleeding issues. He reported recurrent epistaxis as a child, but not real issues as an adult. Denied any other know issues of spontaneously bleeding and also denied any prolonged bleeding with things like finger cuts. When I asked him about bleeding history in his mother, he reported that she had difficult to control bleeding after an MVA and then later was an alcoholic and fell and sustained a scalp laceration that was difficult to stop bleeding." He denied previous referral to a hematologist and denied any formal disorder of a specific bleeding disorder. Except for childhood episodes of epistaxis and intraoperative bleeding with his first back surgery, he denied any spontaneous bleeding or prolonged bleeding."    Preoperative labs noted. Creatinine 1.99. Known CKD, but up from 1.65 on 05/20/22. Creatinine previously ranged ~ 1.55-1.75 in CHL since 02/2021. (Metformin is listed as being on hold. A1c 5.7% on 02/28/23.) H/H 10.1/30.3, previously 12.4/36.2 prior to ACDF in February. Surgeon and/or PCP can follow-up anemia and renal function post-operatively. T&S done with PAT labs.    VS: BP 95/65   Pulse 95   Temp 36.8 C   Resp 17   Ht 5\' 10"  (1.778 m)   Wt 75.3 kg   SpO2 97%   BMI 23.82 kg/m    PROVIDERS: [name], MD  is PCP  Chilton Greathouse, MD is pulmonologist. March 2023 CT showed resolution of RLL cavitary pneumonia.   LABS: Preoperative labs noted. See DISCUSSION.  (  all labs ordered are listed, but only abnormal results are displayed)  Labs Reviewed  SURGICAL PCR SCREEN - Abnormal; Notable for the following components:      Result Value   MRSA, PCR POSITIVE (*)    Staphylococcus aureus POSITIVE (*)    All other components within normal limits  GLUCOSE, CAPILLARY - Abnormal; Notable for the following components:    Glucose-Capillary 124 (*)    All other components within normal limits  HEMOGLOBIN A1C - Abnormal; Notable for the following components:   Hgb A1c MFr Bld 5.7 (*)    All other components within normal limits  BASIC METABOLIC PANEL - Abnormal; Notable for the following components:   Glucose, Bld 114 (*)    BUN 34 (*)    Creatinine, Ser 1.99 (*)    Calcium 8.4 (*)    GFR, Estimated 36 (*)    All other components within normal limits  CBC - Abnormal; Notable for the following components:   RBC 3.63 (*)    Hemoglobin 10.1 (*)    HCT 30.3 (*)    All other components within normal limits  PROTIME-INR  TYPE AND SCREEN     IMAGES: MRI L-spine 09/23/22: IMPRESSION: 1. Diffuse lumbar spine spondylosis as described above without significant interval change compared with 10/03/2021. 2. At L1-2 there is a broad-based disc bulge. Moderate bilateral facet arthropathy with ligamentum flavum infolding. Moderate spinal stenosis. Bilateral subarticular recess stenosis. Severe left foraminal stenosis. Mild right foraminal stenosis. 3. At L2-3 there is a broad-based disc bulge. Moderate bilateral facet arthropathy. Moderate spinal stenosis. Bilateral subarticular recess stenosis. Severe left foraminal stenosis. Moderate right foraminal stenosis. 4. At L3-4 there is a broad-based disc bulge with a broad central/left paracentral disc protrusion. Severe bilateral facet arthropathy. Severe spinal stenosis. Moderate left foraminal stenosis. Severe right foraminal stenosis. 5. At L4-5 there is a broad-based disc osteophyte complex. Mild bilateral facet arthropathy. Moderate bilateral foraminal stenosis. 6. No acute osseous injury of the lumbar spine.   CT Chest High Resolution 05/25/21: IMPRESSION: 1. No findings to suggest interstitial lung disease. 2. Resolution of cavitary pneumonia in the right lower lobe with an area of post infectious scarring on today's study. A few patchy areas of  peribronchovascular ground-glass attenuation remain in the right lower lobe, likely residual areas of post infectious inflammation. 3. Aortic atherosclerosis, in addition to left main and three-vessel coronary artery disease. Please note that although the presence of coronary artery calcium documents the presence of coronary artery disease, the severity of this disease and any potential stenosis cannot be assessed on this non-gated CT examination. Assessment for potential risk factor modification, dietary therapy or pharmacologic therapy may be warranted, if clinically indicated. 4. Probable splenomegaly. - Aortic Atherosclerosis (ICD10-I70.0).   EKG: EKG 03/14/22: Normal sinus rhythm Normal ECG When compared with ECG of 13-Mar-2021 16:36, Premature atrial complexes NO LONGER PRESENT Confirmed by Yates Decamp ([id number]) on 03/15/2022 8:17:26 AM   CV: N/A  Past Medical History:  Diagnosis Date   Arthritis    CKD (chronic kidney disease)    Complication of anesthesia    Reported intraoperative bleeding and "almost lost him" with back surgey around age 35, but no bleeding issues with subsequent surgeries   COVID 2022   Hospital admission   Diabetes mellitus without complication (HCC)    per patient type 2   Facial basal cell cancer    Heart murmur    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  Nerve pain    per patient, both legs   Pneumonia     Past Surgical History:  Procedure Laterality Date   ANTERIOR CERVICAL DECOMP/DISCECTOMY FUSION N/A 05/20/2022   Procedure: ACDF - C3-C4 - C4-C5 - C5-C6;  Surgeon: Tia Alert, MD;  [facility]: [facility];  Service: Neurosurgery;  Laterality: N/A;   BACK SURGERY     x2   BRONCHIAL BIOPSY  03/15/2021   Procedure: BRONCHIAL BIOPSIES;  Surgeon: Tomma Lightning, MD;  Location: [facility] ENDOSCOPY;  Service: Pulmonary;;   BRONCHIAL WASHINGS  03/15/2021   Procedure: BRONCHIAL WASHINGS;  Surgeon: Tomma Lightning, MD;  Location:  [facility];  Service: Pulmonary;;   HERNIA REPAIR     umbilical and right inguinal hernia   INCISION AND DRAINAGE OF WOUND Left 10/27/2016   Procedure: IRRIGATION AND DEBRIDEMENT LEFT PATELLA;  Surgeon: Cammy Copa, MD;  Location: [facility] OR;  Service: Orthopedics;  Laterality: Left;   LESION REMOVAL Left 11/01/2019   Procedure: LEFT FACIAL LESION REMOVAL;  Surgeon: Serena Colonel, MD;  Location: [facility] OR;  Service: ENT;  Laterality: Left;   RADIOLOGY WITH ANESTHESIA N/A 02/05/2022   Procedure: MRI WITH ANESTHESIA OF CERVICAL SPINE WITHOUT CONTRAST;  Surgeon: Radiologist, Medication, MD;  Location: [facility] OR;  Service: Radiology;  Laterality: N/A;   SPINE SURGERY     x2   VIDEO BRONCHOSCOPY N/A 03/15/2021   Procedure: VIDEO BRONCHOSCOPY WITH FLUORO;  Surgeon: Tomma Lightning, MD;  Location: [facility];  Service: Pulmonary;  Laterality: N/A;    MEDICATIONS:  blood glucose meter kit and supplies   diclofenac (VOLTAREN) 75 MG EC tablet   diclofenac Sodium (VOLTAREN) 1 % GEL   gabapentin (NEURONTIN) 300 MG capsule   metFORMIN (GLUCOPHAGE-XR) 500 MG 24 hr tablet   methocarbamol (ROBAXIN-750) 750 MG tablet   oxyCODONE (ROXICODONE) 15 MG immediate release tablet   oxyCODONE-acetaminophen (PERCOCET) 10-325 MG tablet   tamsulosin (FLOMAX) 0.4 MG CAPS capsule   No current facility-administered medications for this encounter.    Shonna Chock, PA-C Surgical Short Stay/Anesthesiology [facility] Phone [phone] [facility] Phone [phone] 03/03/2023 5:51 PM

## 2023-03-03 NOTE — Progress Notes (Signed)
Voicemail left with Walker Shadow, OR scheduler for Dr. Yetta Barre, regarding positive surgical PCR. Result showed +MRSA and +MSSA.

## 2023-03-05 ENCOUNTER — Ambulatory Visit (HOSPITAL_COMMUNITY): Payer: Self-pay | Admitting: Physician Assistant

## 2023-03-05 ENCOUNTER — Observation Stay (HOSPITAL_COMMUNITY)
Admission: RE | Admit: 2023-03-05 | Discharge: 2023-03-06 | Disposition: A | Discharge status: Home / Self Care | Payer: Medicare Other | Attending: Neurological Surgery | Admitting: Neurological Surgery

## 2023-03-05 ENCOUNTER — Encounter (HOSPITAL_COMMUNITY): Payer: Self-pay | Admitting: Neurological Surgery

## 2023-03-05 ENCOUNTER — Ambulatory Visit (HOSPITAL_BASED_OUTPATIENT_CLINIC_OR_DEPARTMENT_OTHER): Payer: Medicare Other | Admitting: Certified Registered"

## 2023-03-05 ENCOUNTER — Other Ambulatory Visit: Payer: Self-pay

## 2023-03-05 ENCOUNTER — Encounter (HOSPITAL_COMMUNITY)
Admission: RE | Disposition: A | Discharge status: Home / Self Care | Payer: Self-pay | Source: Home / Self Care | Attending: Neurological Surgery

## 2023-03-05 ENCOUNTER — Ambulatory Visit (HOSPITAL_COMMUNITY): Payer: Medicare Other

## 2023-03-05 DIAGNOSIS — Z79899 Other long term (current) drug therapy: Secondary | ICD-10-CM | POA: Insufficient documentation

## 2023-03-05 DIAGNOSIS — Z87891 Personal history of nicotine dependence: Secondary | ICD-10-CM | POA: Diagnosis not present

## 2023-03-05 DIAGNOSIS — E1122 Type 2 diabetes mellitus with diabetic chronic kidney disease: Secondary | ICD-10-CM | POA: Insufficient documentation

## 2023-03-05 DIAGNOSIS — Z8616 Personal history of COVID-19: Secondary | ICD-10-CM | POA: Insufficient documentation

## 2023-03-05 DIAGNOSIS — Z7984 Long term (current) use of oral hypoglycemic drugs: Secondary | ICD-10-CM | POA: Insufficient documentation

## 2023-03-05 DIAGNOSIS — M5116 Intervertebral disc disorders with radiculopathy, lumbar region: Secondary | ICD-10-CM | POA: Insufficient documentation

## 2023-03-05 DIAGNOSIS — Z85828 Personal history of other malignant neoplasm of skin: Secondary | ICD-10-CM | POA: Diagnosis not present

## 2023-03-05 DIAGNOSIS — M4316 Spondylolisthesis, lumbar region: Secondary | ICD-10-CM | POA: Diagnosis present

## 2023-03-05 DIAGNOSIS — N189 Chronic kidney disease, unspecified: Secondary | ICD-10-CM | POA: Diagnosis not present

## 2023-03-05 DIAGNOSIS — Z981 Arthrodesis status: Principal | ICD-10-CM

## 2023-03-05 DIAGNOSIS — M48061 Spinal stenosis, lumbar region without neurogenic claudication: Secondary | ICD-10-CM | POA: Insufficient documentation

## 2023-03-05 DIAGNOSIS — E119 Type 2 diabetes mellitus without complications: Secondary | ICD-10-CM

## 2023-03-05 LAB — GLUCOSE, CAPILLARY
Glucose-Capillary: 131 mg/dL — ABNORMAL HIGH (ref 70–99)
Glucose-Capillary: 129 mg/dL — ABNORMAL HIGH (ref 70–99)
Glucose-Capillary: 125 mg/dL — ABNORMAL HIGH (ref 70–99)
Glucose-Capillary: 128 mg/dL — ABNORMAL HIGH (ref 70–99)
Glucose-Capillary: 193 mg/dL — ABNORMAL HIGH (ref 70–99)

## 2023-03-05 SURGERY — POSTERIOR LUMBAR FUSION 1 LEVEL
Anesthesia: General | Site: Spine Lumbar

## 2023-03-05 MED ORDER — PHENYLEPHRINE HCL-NACL 20-0.9 MG/250ML-% IV SOLN
INTRAVENOUS | Status: AC
  Filled 2023-03-05: qty 250

## 2023-03-05 MED ORDER — DEXAMETHASONE SODIUM PHOSPHATE 10 MG/ML IJ SOLN
INTRAMUSCULAR | Status: AC
  Filled 2023-03-05: qty 1

## 2023-03-05 MED ORDER — SUGAMMADEX SODIUM 200 MG/2ML IV SOLN
INTRAVENOUS | Status: DC | PRN
  Administered 2023-03-05: 200 mg via INTRAVENOUS

## 2023-03-05 MED ORDER — SODIUM CHLORIDE 0.9 % IV SOLN: 250.0000 mL | INTRAVENOUS | Status: DC

## 2023-03-05 MED ORDER — LIDOCAINE 2% (20 MG/ML) 5 ML SYRINGE
INTRAMUSCULAR | Status: AC
  Filled 2023-03-05: qty 20

## 2023-03-05 MED ORDER — PHENYLEPHRINE 80 MCG/ML (10ML) SYRINGE FOR IV PUSH (FOR BLOOD PRESSURE SUPPORT)
PREFILLED_SYRINGE | INTRAVENOUS | Status: DC | PRN
  Administered 2023-03-05: 25 ug via INTRAVENOUS
  Administered 2023-03-05 (×3): 80 ug via INTRAVENOUS

## 2023-03-05 MED ORDER — HYDROMORPHONE HCL 1 MG/ML IJ SOLN: 0.5000 mg | INTRAMUSCULAR | Status: DC | PRN

## 2023-03-05 MED ORDER — KETAMINE HCL 10 MG/ML IJ SOLN
INTRAMUSCULAR | Status: DC | PRN
  Administered 2023-03-05: 30 mg via INTRAVENOUS

## 2023-03-05 MED ORDER — CEFAZOLIN SODIUM-DEXTROSE 2-4 GM/100ML-% IV SOLN
2.0000 g | INTRAVENOUS | Status: AC
  Administered 2023-03-05: 2 g via INTRAVENOUS

## 2023-03-05 MED ORDER — VANCOMYCIN HCL IN DEXTROSE 1-5 GM/200ML-% IV SOLN: 1000.0000 mg | INTRAVENOUS | Status: AC

## 2023-03-05 MED ORDER — THROMBIN 20000 UNITS EX SOLR: CUTANEOUS | Status: DC | PRN

## 2023-03-05 MED ORDER — ONDANSETRON HCL 4 MG/2ML IJ SOLN: 4.0000 mg | Freq: Four times a day (QID) | INTRAMUSCULAR | Status: DC | PRN

## 2023-03-05 MED ORDER — ONDANSETRON HCL 4 MG/2ML IJ SOLN
INTRAMUSCULAR | Status: DC | PRN
  Administered 2023-03-05: 4 mg via INTRAVENOUS

## 2023-03-05 MED ORDER — SODIUM CHLORIDE 0.9% FLUSH: 3.0000 mL | Freq: Two times a day (BID) | INTRAVENOUS | Status: DC

## 2023-03-05 MED ORDER — CHLORHEXIDINE GLUCONATE 0.12 % MT SOLN: 15.0000 mL | Freq: Once | OROMUCOSAL | Status: AC

## 2023-03-05 MED ORDER — LIDOCAINE 2% (20 MG/ML) 5 ML SYRINGE
INTRAMUSCULAR | Status: DC | PRN
  Administered 2023-03-05: 30 mg via INTRAVENOUS
  Administered 2023-03-05: 70 mg via INTRAVENOUS

## 2023-03-05 MED ORDER — GABAPENTIN 300 MG PO CAPS
ORAL_CAPSULE | ORAL | Status: AC
  Filled 2023-03-05: qty 1

## 2023-03-05 MED ORDER — MIDAZOLAM HCL 2 MG/2ML IJ SOLN
INTRAMUSCULAR | Status: DC | PRN
  Administered 2023-03-05 (×2): 1 mg via INTRAVENOUS

## 2023-03-05 MED ORDER — BUPIVACAINE HCL (PF) 0.25 % IJ SOLN
INTRAMUSCULAR | Status: AC
  Filled 2023-03-05: qty 30

## 2023-03-05 MED ORDER — INSULIN ASPART 100 UNIT/ML IJ SOLN
0.0000 [IU] | Freq: Three times a day (TID) | INTRAMUSCULAR | Status: DC
Start: 2023-03-06 — End: 2023-03-06
  Administered 2023-03-06: 2 [IU] via SUBCUTANEOUS

## 2023-03-05 MED ORDER — ROCURONIUM BROMIDE 10 MG/ML (PF) SYRINGE
PREFILLED_SYRINGE | INTRAVENOUS | Status: AC
  Filled 2023-03-05: qty 10

## 2023-03-05 MED ORDER — SODIUM CHLORIDE 0.9 % IV SOLN: INTRAVENOUS | Status: DC | PRN

## 2023-03-05 MED ORDER — KETAMINE HCL 50 MG/5ML IJ SOSY
PREFILLED_SYRINGE | INTRAMUSCULAR | Status: AC
  Filled 2023-03-05: qty 5

## 2023-03-05 MED ORDER — BUPIVACAINE HCL (PF) 0.25 % IJ SOLN
INTRAMUSCULAR | Status: DC | PRN
  Administered 2023-03-05: 4 mL

## 2023-03-05 MED ORDER — GABAPENTIN 300 MG PO CAPS
300.0000 mg | ORAL_CAPSULE | Freq: Three times a day (TID) | ORAL | Status: DC
  Administered 2023-03-05 – 2023-03-06 (×2): 300 mg via ORAL
  Filled 2023-03-05 (×2): qty 1

## 2023-03-05 MED ORDER — PROPOFOL 10 MG/ML IV BOLUS
INTRAVENOUS | Status: AC
  Filled 2023-03-05: qty 20

## 2023-03-05 MED ORDER — ACETAMINOPHEN 500 MG PO TABS: 1000.0000 mg | ORAL_TABLET | ORAL | Status: AC

## 2023-03-05 MED ORDER — METHOCARBAMOL 500 MG PO TABS
500.0000 mg | ORAL_TABLET | Freq: Four times a day (QID) | ORAL | Status: DC | PRN
  Administered 2023-03-05 – 2023-03-06 (×2): 500 mg via ORAL
  Filled 2023-03-05 (×2): qty 1

## 2023-03-05 MED ORDER — CELECOXIB 200 MG PO CAPS
200.0000 mg | ORAL_CAPSULE | Freq: Two times a day (BID) | ORAL | Status: DC
  Administered 2023-03-05 – 2023-03-06 (×2): 200 mg via ORAL
  Filled 2023-03-05 (×2): qty 1

## 2023-03-05 MED ORDER — MIDAZOLAM HCL 2 MG/2ML IJ SOLN
INTRAMUSCULAR | Status: AC
  Filled 2023-03-05: qty 2

## 2023-03-05 MED ORDER — PROPOFOL 1000 MG/100ML IV EMUL
INTRAVENOUS | Status: AC
  Filled 2023-03-05: qty 200

## 2023-03-05 MED ORDER — DEXTROSE-SODIUM CHLORIDE 5-0.45 % IV SOLN: INTRAVENOUS | Status: DC

## 2023-03-05 MED ORDER — ONDANSETRON HCL 4 MG/2ML IJ SOLN
INTRAMUSCULAR | Status: AC
  Filled 2023-03-05: qty 2

## 2023-03-05 MED ORDER — ACETAMINOPHEN 500 MG PO TABS
1000.0000 mg | ORAL_TABLET | Freq: Four times a day (QID) | ORAL | Status: DC
  Administered 2023-03-05 – 2023-03-06 (×3): 1000 mg via ORAL
  Filled 2023-03-05 (×3): qty 2

## 2023-03-05 MED ORDER — INSULIN ASPART 100 UNIT/ML IJ SOLN: 0.0000 [IU] | Freq: Every day | INTRAMUSCULAR | Status: DC

## 2023-03-05 MED ORDER — METHOCARBAMOL 1000 MG/10ML IJ SOLN: 500.0000 mg | Freq: Four times a day (QID) | INTRAMUSCULAR | Status: DC | PRN

## 2023-03-05 MED ORDER — VASHE WOUND IRRIGATION OPTIME
TOPICAL | Status: DC | PRN
  Administered 2023-03-05: 34 [oz_av]

## 2023-03-05 MED ORDER — SODIUM CHLORIDE 0.9% FLUSH: 3.0000 mL | INTRAVENOUS | Status: DC | PRN

## 2023-03-05 MED ORDER — HYDROMORPHONE HCL 1 MG/ML IJ SOLN
INTRAMUSCULAR | Status: AC
  Filled 2023-03-05: qty 1

## 2023-03-05 MED ORDER — ONDANSETRON HCL 4 MG PO TABS: 4.0000 mg | ORAL_TABLET | Freq: Four times a day (QID) | ORAL | Status: DC | PRN

## 2023-03-05 MED ORDER — DEXAMETHASONE SODIUM PHOSPHATE 10 MG/ML IJ SOLN
INTRAMUSCULAR | Status: DC | PRN
  Administered 2023-03-05: 4 mg via INTRAVENOUS

## 2023-03-05 MED ORDER — INSULIN ASPART 100 UNIT/ML IJ SOLN
0.0000 [IU] | INTRAMUSCULAR | Status: DC | PRN
Start: 2023-03-05 — End: 2023-03-05

## 2023-03-05 MED ORDER — 0.9 % SODIUM CHLORIDE (POUR BTL) OPTIME
TOPICAL | Status: DC | PRN
  Administered 2023-03-05: 1000 mL

## 2023-03-05 MED ORDER — ORAL CARE MOUTH RINSE: 15.0000 mL | Freq: Once | OROMUCOSAL | Status: AC

## 2023-03-05 MED ORDER — SENNA 8.6 MG PO TABS
1.0000 | ORAL_TABLET | Freq: Two times a day (BID) | ORAL | Status: DC
  Administered 2023-03-05 – 2023-03-06 (×2): 8.6 mg via ORAL
  Filled 2023-03-05 (×2): qty 1

## 2023-03-05 MED ORDER — CHLORHEXIDINE GLUCONATE CLOTH 2 % EX PADS
6.0000 | MEDICATED_PAD | Freq: Once | CUTANEOUS | Status: DC
Start: 2023-03-05 — End: 2023-03-05

## 2023-03-05 MED ORDER — FENTANYL CITRATE (PF) 250 MCG/5ML IJ SOLN
INTRAMUSCULAR | Status: DC | PRN
  Administered 2023-03-05: 75 ug via INTRAVENOUS
  Administered 2023-03-05 (×2): 50 ug via INTRAVENOUS

## 2023-03-05 MED ORDER — CHLORHEXIDINE GLUCONATE 0.12 % MT SOLN
OROMUCOSAL | Status: AC
  Administered 2023-03-05: 15 mL via OROMUCOSAL
  Filled 2023-03-05: qty 15

## 2023-03-05 MED ORDER — HYDROMORPHONE HCL 1 MG/ML IJ SOLN
0.2500 mg | INTRAMUSCULAR | Status: DC | PRN
  Administered 2023-03-05 (×2): 0.5 mg via INTRAVENOUS

## 2023-03-05 MED ORDER — VANCOMYCIN HCL IN DEXTROSE 1-5 GM/200ML-% IV SOLN
INTRAVENOUS | Status: AC
  Administered 2023-03-05: 1000 mg via INTRAVENOUS
  Filled 2023-03-05: qty 200

## 2023-03-05 MED ORDER — THROMBIN 20000 UNITS EX SOLR
CUTANEOUS | Status: AC
  Filled 2023-03-05: qty 20000

## 2023-03-05 MED ORDER — FENTANYL CITRATE (PF) 250 MCG/5ML IJ SOLN
INTRAMUSCULAR | Status: AC
  Filled 2023-03-05: qty 5

## 2023-03-05 MED ORDER — EPHEDRINE SULFATE-NACL 50-0.9 MG/10ML-% IV SOSY
PREFILLED_SYRINGE | INTRAVENOUS | Status: DC | PRN
  Administered 2023-03-05: 10 mg via INTRAVENOUS

## 2023-03-05 MED ORDER — THROMBIN 5000 UNITS EX SOLR: OROMUCOSAL | Status: DC | PRN

## 2023-03-05 MED ORDER — THROMBIN 5000 UNITS EX SOLR
CUTANEOUS | Status: AC
Start: 2023-03-05 — End: ?
  Filled 2023-03-05: qty 5000

## 2023-03-05 MED ORDER — LIDOCAINE 2% (20 MG/ML) 5 ML SYRINGE
INTRAMUSCULAR | Status: AC
  Filled 2023-03-05: qty 5

## 2023-03-05 MED ORDER — LACTATED RINGERS IV SOLN
INTRAVENOUS | Status: DC
Start: 2023-03-05 — End: 2023-03-05

## 2023-03-05 MED ORDER — OXYCODONE HCL 5 MG PO TABS
10.0000 mg | ORAL_TABLET | ORAL | Status: DC | PRN
  Administered 2023-03-05 – 2023-03-06 (×6): 10 mg via ORAL
  Filled 2023-03-05 (×6): qty 2

## 2023-03-05 MED ORDER — CEFAZOLIN SODIUM-DEXTROSE 2-4 GM/100ML-% IV SOLN
2.0000 g | Freq: Three times a day (TID) | INTRAVENOUS | Status: AC
Start: 2023-03-05 — End: 2023-03-06
  Administered 2023-03-05 – 2023-03-06 (×2): 2 g via INTRAVENOUS
  Filled 2023-03-05 (×2): qty 100

## 2023-03-05 MED ORDER — PHENOL 1.4 % MT LIQD: 1.0000 | OROMUCOSAL | Status: DC | PRN

## 2023-03-05 MED ORDER — TAMSULOSIN HCL 0.4 MG PO CAPS
0.4000 mg | ORAL_CAPSULE | Freq: Every day | ORAL | Status: DC
  Administered 2023-03-05: 0.4 mg via ORAL
  Filled 2023-03-05: qty 1

## 2023-03-05 MED ORDER — MENTHOL 3 MG MT LOZG: 1.0000 | LOZENGE | OROMUCOSAL | Status: DC | PRN

## 2023-03-05 MED ORDER — ACETAMINOPHEN 500 MG PO TABS
ORAL_TABLET | ORAL | Status: AC
  Administered 2023-03-05: 1000 mg via ORAL
  Filled 2023-03-05: qty 2

## 2023-03-05 MED ORDER — ROCURONIUM BROMIDE 10 MG/ML (PF) SYRINGE
PREFILLED_SYRINGE | INTRAVENOUS | Status: DC | PRN
  Administered 2023-03-05: 5 mg via INTRAVENOUS
  Administered 2023-03-05: 70 mg via INTRAVENOUS

## 2023-03-05 MED ORDER — CHLORHEXIDINE GLUCONATE CLOTH 2 % EX PADS: 6.0000 | MEDICATED_PAD | Freq: Once | CUTANEOUS | Status: DC

## 2023-03-05 MED ORDER — LIDOCAINE HCL (PF) 2 % IJ SOLN
INTRAMUSCULAR | Status: DC | PRN
  Administered 2023-03-05: 1 mg/kg/h via INTRADERMAL

## 2023-03-05 MED ORDER — ALBUMIN HUMAN 5 % IV SOLN: INTRAVENOUS | Status: DC | PRN

## 2023-03-05 MED ORDER — PROPOFOL 10 MG/ML IV BOLUS
INTRAVENOUS | Status: DC | PRN
  Administered 2023-03-05: 40 mg via INTRAVENOUS
  Administered 2023-03-05: 100 mg via INTRAVENOUS

## 2023-03-05 MED ORDER — GABAPENTIN 300 MG PO CAPS: 300.0000 mg | ORAL_CAPSULE | ORAL | Status: DC

## 2023-03-05 MED ORDER — PHENYLEPHRINE HCL-NACL 20-0.9 MG/250ML-% IV SOLN
INTRAVENOUS | Status: DC | PRN
  Administered 2023-03-05: 10 ug/min via INTRAVENOUS

## 2023-03-05 MED ORDER — CEFAZOLIN SODIUM-DEXTROSE 2-4 GM/100ML-% IV SOLN
INTRAVENOUS | Status: AC
  Filled 2023-03-05: qty 100

## 2023-03-05 SURGICAL SUPPLY — 53 items
ALLOGRAFT BONE FIBER KORE 5 (Bone Implant) IMPLANT
BAG COUNTER SPONGE SURGICOUNT (BAG) ×1 IMPLANT
BASKET BONE COLLECTION (BASKET) ×1 IMPLANT
BENZOIN TINCTURE PRP APPL 2/3 (GAUZE/BANDAGES/DRESSINGS) ×1 IMPLANT
BLADE BONE MILL MEDIUM (MISCELLANEOUS) ×1 IMPLANT
BLADE CLIPPER SURG (BLADE) IMPLANT
BUR CARBIDE MATCH 3.0 (BURR) ×1 IMPLANT
CANISTER SUCT 3000ML PPV (MISCELLANEOUS) ×1 IMPLANT
CNTNR URN SCR LID CUP LEK RST (MISCELLANEOUS) ×1 IMPLANT
COVER BACK TABLE 60X90IN (DRAPES) ×1 IMPLANT
DERMABOND ADVANCED .7 DNX12 (GAUZE/BANDAGES/DRESSINGS) ×1 IMPLANT
DRAPE C-ARM 42X72 X-RAY (DRAPES) ×2 IMPLANT
DRAPE C-ARMOR (DRAPES) ×1 IMPLANT
DRAPE LAPAROTOMY 100X72X124 (DRAPES) ×1 IMPLANT
DRAPE SURG 17X23 STRL (DRAPES) ×1 IMPLANT
DRSG OPSITE POSTOP 4X6 (GAUZE/BANDAGES/DRESSINGS) IMPLANT
DURAPREP 26ML APPLICATOR (WOUND CARE) ×1 IMPLANT
ELECT REM PT RETURN 9FT ADLT (ELECTROSURGICAL) ×1
ELECTRODE REM PT RTRN 9FT ADLT (ELECTROSURGICAL) ×1 IMPLANT
EVACUATOR 1/8 PVC DRAIN (DRAIN) ×1 IMPLANT
GAUZE 4X4 16PLY ~~LOC~~+RFID DBL (SPONGE) IMPLANT
GLOVE BIO SURGEON STRL SZ7 (GLOVE) IMPLANT
GLOVE BIO SURGEON STRL SZ8 (GLOVE) ×2 IMPLANT
GLOVE BIOGEL PI IND STRL 7.0 (GLOVE) IMPLANT
GOWN STRL REUS W/ TWL LRG LVL3 (GOWN DISPOSABLE) IMPLANT
GOWN STRL REUS W/ TWL XL LVL3 (GOWN DISPOSABLE) ×2 IMPLANT
GOWN STRL REUS W/TWL 2XL LVL3 (GOWN DISPOSABLE) IMPLANT
HEMOSTAT POWDER KIT SURGIFOAM (HEMOSTASIS) ×1 IMPLANT
KIT BASIN OR (CUSTOM PROCEDURE TRAY) ×1 IMPLANT
KIT TURNOVER KIT B (KITS) ×1 IMPLANT
MILL BONE PREP (MISCELLANEOUS) ×1 IMPLANT
NDL HYPO 25X1 1.5 SAFETY (NEEDLE) ×1 IMPLANT
NEEDLE HYPO 25X1 1.5 SAFETY (NEEDLE) ×1 IMPLANT
NS IRRIG 1000ML POUR BTL (IV SOLUTION) ×1 IMPLANT
PACK LAMINECTOMY NEURO (CUSTOM PROCEDURE TRAY) ×1 IMPLANT
PAD ARMBOARD 7.5X6 YLW CONV (MISCELLANEOUS) ×3 IMPLANT
ROD LORD LIPPED TI 5.5X35 (Rod) IMPLANT
ROD LORD LIPPED TI 5.5X40 (Rod) IMPLANT
SCREW CORT SHANK MOD 6.5X40 (Screw) IMPLANT
SCREW KODIAK 6.5X45 (Screw) IMPLANT
SCREW POLYAXIAL TULIP (Screw) IMPLANT
SET SCREW SPNE (Screw) IMPLANT
SOLUTION IRRIG SURGIPHOR (IV SOLUTION) ×1 IMPLANT
SPACER IDENTITI PS 10X9X25 10D (Spacer) IMPLANT
SPONGE SURGIFOAM ABS GEL 100 (HEMOSTASIS) ×1 IMPLANT
SPONGE T-LAP 4X18 ~~LOC~~+RFID (SPONGE) IMPLANT
STRIP CLOSURE SKIN 1/2X4 (GAUZE/BANDAGES/DRESSINGS) ×2 IMPLANT
SUT VIC AB 0 CT1 18XCR BRD8 (SUTURE) ×1 IMPLANT
SUT VIC AB 2-0 CP2 18 (SUTURE) ×1 IMPLANT
SUT VIC AB 3-0 SH 8-18 (SUTURE) ×2 IMPLANT
TOWEL GREEN STERILE (TOWEL DISPOSABLE) ×1 IMPLANT
TOWEL GREEN STERILE FF (TOWEL DISPOSABLE) ×1 IMPLANT
WATER STERILE IRR 1000ML POUR (IV SOLUTION) ×1 IMPLANT

## 2023-03-05 NOTE — Transfer of Care (Signed)
Immediate Anesthesia Transfer of Care Note  Patient: Vincent [name]  Procedure(s) Performed: Posterior Lumbar Interbody Fusion, Lumbar Three-Lumbar Four, Posterior lateral and Interbody Fusion (Spine Lumbar)  Patient Location: PACU  Anesthesia Type:General  Level of Consciousness: patient cooperative  Airway & Oxygen Therapy: Patient Spontanous Breathing and Patient connected to face mask oxygen  Post-op Assessment: Report given to RN and Post -op Vital signs reviewed and stable  Post vital signs: Reviewed and stable  Last Vitals:  Vitals Value Taken Time  BP 121/101 03/05/23 1541  Temp    Pulse 91 03/05/23 1545  Resp 15 03/05/23 1545  SpO2 100 % 03/05/23 1545  Vitals shown include unfiled device data.  Last Pain:  Vitals:   03/05/23 1055  PainSc: 9       Patients Stated Pain Goal: 0 (03/05/23 1055)  Complications: No notable events documented.

## 2023-03-05 NOTE — H&P (Signed)
Subjective: Patient is a 67 y.o. male admitted for back pain and leg pain. Onset of symptoms was several years ago, gradually worsening since that time.  The pain is rated severe, and is located at the across the lower back and radiates to legs. The pain is described as aching and occurs all day. The symptoms have been progressive. Symptoms are exacerbated by coughing, exercise, extension, and standing. 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    Nerve pain    per patient, both legs   Pneumonia     Past Surgical History:  Procedure Laterality Date   ANTERIOR CERVICAL DECOMP/DISCECTOMY FUSION N/A 05/20/2022   Procedure: ACDF - C3-C4 - C4-C5 - C5-C6;  Surgeon: Tia Alert, MD;  Location: [facility] OR;  Service: Neurosurgery;  Laterality: N/A;   BACK SURGERY     x2   BRONCHIAL BIOPSY  03/15/2021   Procedure: BRONCHIAL BIOPSIES;  Surgeon: Tomma Lightning, MD;  Location: [facility] ENDOSCOPY;  Service: Pulmonary;;   BRONCHIAL WASHINGS  03/15/2021   Procedure: BRONCHIAL WASHINGS;  Surgeon: Tomma Lightning, MD;  Location: [facility]OSCOPY;  Service: Pulmonary;;   HERNIA REPAIR     umbilical and right inguinal hernia   INCISION AND DRAINAGE OF WOUND Left 10/27/2016   Procedure: IRRIGATION AND DEBRIDEMENT LEFT PATELLA;  Surgeon: Cammy Copa, MD;  Location: [facility] OR;  Service: Orthopedics;  Laterality: Left;   LESION REMOVAL Left 11/01/2019   Procedure: LEFT FACIAL LESION REMOVAL;  Surgeon: Serena Colonel, MD;  Location: [facility] OR;   Service: ENT;  Laterality: Left;   RADIOLOGY WITH ANESTHESIA N/A 02/05/2022   Procedure: MRI WITH ANESTHESIA OF CERVICAL SPINE WITHOUT CONTRAST;  Surgeon: Radiologist, Medication, MD;  Location: [facility] OR;  Service: Radiology;  Laterality: N/A;   SPINE SURGERY     x2   VIDEO BRONCHOSCOPY N/A 03/15/2021   Procedure: VIDEO BRONCHOSCOPY WITH FLUORO;  Surgeon: Tomma Lightning, MD;  Location: [facility];  Service: Pulmonary;  Laterality: N/A;    Prior to Admission medications   Medication Sig Start Date End Date Taking? Authorizing Provider  diclofenac (VOLTAREN) 75 MG EC tablet Take 75 mg by mouth 2 (two) times daily.  02/18/16  Yes [provider]  diclofenac Sodium (VOLTAREN) 1 % GEL Apply 1 application topically daily as needed (back pain).   Yes [provider]  gabapentin (NEURONTIN) 300 MG capsule Take 300-600 mg by mouth 4 (four) times daily.   Yes [provider]  metFORMIN (GLUCOPHAGE-XR) 500 MG 24 hr tablet Take 500 mg by mouth 2 (two) times daily.   Yes [provider]  oxyCODONE (ROXICODONE) 15 MG immediate release tablet Take 15 mg by mouth every 8 (eight) hours as needed for pain. 02/24/23  Yes [provider]  tamsulosin (FLOMAX) 0.4 MG CAPS capsule Take 0.4 mg by mouth daily after supper.   Yes [provider]  blood glucose meter kit and supplies Dispense based on patient  and insurance preference. Use up to four times daily as directed. (FOR ICD-10 E11.8). 10/29/16   Johnson, Clanford L, MD  methocarbamol (ROBAXIN-750) 750 MG tablet Take 1 tablet (750 mg total) by mouth 4 (four) times daily. Patient not taking: Reported on 02/25/2023 05/21/22   Meyran, Tiana Loft, NP  oxyCODONE-acetaminophen (PERCOCET) 10-325 MG tablet Take 1 tablet by mouth every 4 (four) hours as needed for pain. Patient not taking: Reported on 02/25/2023 05/21/22 05/21/23  Sherryl Manges, NP   Allergies  Allergen Reactions   Ivp Dye [Iodinated  Contrast Media] Nausea And Vomiting    Severe vomiting   Penicillins Nausea And Vomiting    Social History   Tobacco Use   Smoking status: Former    Current packs/day: 0.00    Types: Cigarettes    Quit date: 03/25/1988    Years since quitting: 34.9    Passive exposure: Past   Smokeless tobacco: Never  Substance Use Topics   Alcohol use: No    History reviewed. No pertinent family history.   Review of Systems  Positive ROS: 1+  All other systems have been reviewed and were otherwise negative with the exception of those mentioned in the HPI and as above.  Objective: Vital signs in last 24 hours: Temp:  [99.3 F (37.4 C)] 99.3 F (37.4 C) (12/11 1011) Pulse Rate:  [100] 100 (12/11 1011) Resp:  [17] 17 (12/11 1011) BP: (129)/(70) 129/70 (12/11 1011) SpO2:  [98 %] 98 % (12/11 1011) Weight:  [72.6 kg] 72.6 kg (12/11 1011)  General Appearance: Alert, cooperative, no distress, appears stated age Head: Normocephalic, without obvious abnormality, atraumatic Eyes: PERRL, conjunctiva/corneas clear, EOM's intact    Neck: Supple, symmetrical, trachea midline Back: Symmetric, no curvature, ROM normal, no CVA tenderness Lungs:  respirations unlabored Heart: Regular rate and rhythm Abdomen: Soft, non-tender Extremities: Extremities normal, atraumatic, no cyanosis or edema Pulses: 2+ and symmetric all extremities Skin: Skin color, texture, turgor normal, no rashes or lesions  NEUROLOGIC:   Mental status: Alert and oriented x4,  no aphasia, good attention span, fund of knowledge, and memory Motor Exam - grossly normal Sensory Exam - grossly normal Reflexes: 1+ Coordination - grossly normal Gait - grossly normal Balance - grossly normal Cranial Nerves: I: smell Not tested  II: visual acuity  OS: nl    OD: nl  II: visual fields Full to confrontation  II: pupils Equal, round, reactive to light  III,VII: ptosis None  III,IV,VI: extraocular muscles  Full ROM  V: mastication  Normal  V: facial light touch sensation  Normal  V,VII: corneal reflex  Present  VII: facial muscle function - upper  Normal  VII: facial muscle function - lower Normal  VIII: hearing Not tested  IX: soft palate elevation  Normal  IX,X: gag reflex Present  XI: trapezius strength  5/5  XI: sternocleidomastoid strength 5/5  XI: neck flexion strength  5/5  XII: tongue strength  Normal    Data Review Lab Results  Component Value Date   WBC 5.9 02/28/2023   HGB 10.1 (L) 02/28/2023   HCT 30.3 (L) 02/28/2023   MCV 83.5 02/28/2023   PLT 154 02/28/2023   Lab Results  Component Value Date   NA 137 02/28/2023   K 4.4 02/28/2023   CL 105 02/28/2023   CO2 24 02/28/2023   BUN 34 (H) 02/28/2023   CREATININE 1.99 (H) 02/28/2023   GLUCOSE 114 (H) 02/28/2023   Lab Results  Component Value Date  INR 1.2 02/28/2023    Assessment/Plan:  Estimated body mass index is 22.96 kg/m as calculated from the following:   Height as of this encounter: 5\' 10"  (1.778 m).   Weight as of this encounter: 72.6 kg. Patient admitted for PLIF L3-4. Patient has failed a reasonable attempt at conservative therapy.  I explained the condition and procedure to the patient and answered any questions.  Patient wishes to proceed with procedure as planned. Understands risks/ benefits and typical outcomes of procedure.   Tia Alert 03/05/2023 12:02 PM '

## 2023-03-05 NOTE — Anesthesia Procedure Notes (Signed)
Procedure Name: Intubation Date/Time: 03/05/2023 1:01 PM  Performed by: Wilder Glade, CRNAPre-anesthesia Checklist: Patient identified, Emergency Drugs available, Suction available, Patient being monitored and Timeout performed Patient Re-evaluated:Patient Re-evaluated prior to induction Oxygen Delivery Method: Circle system utilized Preoxygenation: Pre-oxygenation with 100% oxygen Induction Type: IV induction Ventilation: Mask ventilation without difficulty Laryngoscope Size: Miller and 2 Grade View: Grade I Tube type: Oral Tube size: 7.5 mm Number of attempts: 1 (brief atraumatic dentition unchanged from preoperative status) Airway Equipment and Method: Stylet Placement Confirmation: ETT inserted through vocal cords under direct vision, positive ETCO2 and breath sounds checked- equal and bilateral Secured at: 22 cm Tube secured with: Tape Dental Injury: Teeth and Oropharynx as per pre-operative assessment

## 2023-03-05 NOTE — Op Note (Signed)
03/05/2023  3:29 PM  PATIENT:  Vincent [name]  67 y.o. 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  POST-OPERATIVE DIAGNOSIS:  same  PROCEDURE:   1. Decompressive lumbar laminectomy, hemi facetectomy and foraminotomies L3-4 requiring more work than would be required for a simple exposure of the disk for PLIF in order to adequately decompress the neural elements and address the spinal stenosis 2. Posterior lumbar interbody fusion L3-4 using PTI interbody cages packed with morcellized allograft and autograft  3. Posterior fixation L3-4 using ATEC cortical pedicle screws.  4. Intertransverse arthrodesis L3-4 using morcellized autograft and allograft.  SURGEON:  Marikay Alar, MD  ASSISTANTS: Verlin Dike, FNP  ANESTHESIA:  General  EBL: 300 ml  Total I/O In: 850 [I.V.:500; IV Piggyback:350] Out: 300 [Blood:300]  BLOOD ADMINISTERED:none  DRAINS: none   INDICATION FOR PROCEDURE: This patient presented with severe back pain and left leg pain. Imaging revealed severe spinal stenosis at L3-4 with a spondylolisthesis, flatback, and a large left L3-4 herniated disc. The patient tried a reasonable attempt at conservative medical measures without relief. I recommended decompression and instrumented fusion to address the stenosis as well as the segmental  instability.  Patient understood the risks, benefits, and alternatives and potential outcomes and wished to proceed.  PROCEDURE DETAILS:  The patient was brought to the operating room. After induction of generalized endotracheal anesthesia the patient was rolled into the prone position on chest rolls and all pressure points were padded. The patient's lumbar region was cleaned and then prepped with DuraPrep and draped in the usual sterile fashion. Anesthesia was injected and then a dorsal midline incision was made and carried down to the lumbosacral fascia. The  fascia was opened and the paraspinous musculature was taken down in a subperiosteal fashion to expose L3-4. A self-retaining retractor was placed. Intraoperative fluoroscopy confirmed my level, and I started with placement of the L3 cortical pedicle screws. The pedicle screw entry zones were identified utilizing surface landmarks and  AP and lateral fluoroscopy. I scored the cortex with the high-speed drill and then used the hand drill to drill an upward and outward direction into the pedicle. I then tapped line to line. I then placed a 6.5 x 40 mm cortical pedicle screw into the pedicles of L3 bilaterally.    I then turned my attention to the decompression and complete lumbar laminectomies, hemi- facetectomies, and foraminotomies were performed at L3-4.  My nurse practitioner was directly involved in the decompression and exposure of the neural elements. the patient had significant spinal stenosis and this required more work than would be required for a simple exposure of the disc for posterior lumbar interbody fusion which would only require a limited laminotomy. Much more generous decompression and generous foraminotomy was undertaken in order to adequately decompress the neural elements and address the patient's leg pain. The yellow ligament was removed to expose the underlying dura and nerve roots, and generous foraminotomies were performed to adequately decompress the neural elements. Both the exiting and traversing nerve roots were decompressed on both sides until a coronary dilator passed easily along the nerve roots. Once the decompression was complete, I turned my attention to the posterior lower lumbar interbody fusion. The epidural venous vasculature was coagulated and cut sharply. Disc space was incised and the initial discectomy was performed with pituitary rongeurs. The disc space was distracted with sequential distractors to a height of 10 mm. We then used a series of  scrapers and shavers to  prepare the endplates for fusion. The midline was prepared with Epstein curettes. Once the complete discectomy was finished, we packed an appropriate sized interbody cage with local autograft and morcellized allograft, gently retracted the nerve root, and tapped the cage into position at L3-4.  The midline between the cages was packed with morselized autograft and allograft.   We then turned our attention to the placement of the lower pedicle screws. The pedicle screw entry zones were identified utilizing surface landmarks and fluoroscopy. I drilled into each pedicle utilizing the hand drill, and tapped each pedicle with the appropriate tap. We palpated with a ball probe to assure no break in the cortex. We then placed 6.5 x 45 mm pedicle screws into the pedicles bilaterally at L4.  My nurse practitioner assisted in placement of the pedicle screws.  We then decorticated the transverse processes and laid a mixture of morcellized autograft and allograft out over these to perform intertransverse arthrodesis at L3-4. We then placed lordotic rods into the multiaxial screw heads of the pedicle screws and locked these in position with the locking caps and anti-torque device. We then checked our construct with AP and lateral fluoroscopy. Irrigated with copious amounts of Vashe followed by saline solution. Inspected the nerve roots once again to assure adequate decompression, lined to the dura with Gelfoam,  and then we closed the muscle and the fascia with 0 Vicryl. Closed the subcutaneous tissues with 2-0 Vicryl and subcuticular tissues with 3-0 Vicryl. The skin was closed with benzoin and Steri-Strips. Dressing was then applied, the patient was awakened from general anesthesia and transported to the recovery room in stable condition. At the end of the procedure all sponge, needle and instrument counts were correct.   PLAN OF CARE: admit to inpatient  PATIENT DISPOSITION:  PACU - hemodynamically stable.   Delay  start of Pharmacological VTE agent (>24hrs) due to surgical blood loss or risk of bleeding:  yes

## 2023-03-05 NOTE — Anesthesia Postprocedure Evaluation (Signed)
Anesthesia Post Note  Patient: Vincent [name]  Procedure(s) Performed: Posterior Lumbar Interbody Fusion, Lumbar Three-Lumbar Four, Posterior lateral and Interbody Fusion (Spine Lumbar)     Patient location during evaluation: PACU Anesthesia Type: General Level of consciousness: awake and alert Pain management: pain level controlled Vital Signs Assessment: post-procedure vital signs reviewed and stable Respiratory status: spontaneous breathing, nonlabored ventilation and respiratory function stable Cardiovascular status: blood pressure returned to baseline and stable Postop Assessment: no apparent nausea or vomiting Anesthetic complications: no  No notable events documented.  Last Vitals:  Vitals:   03/05/23 1630 03/05/23 1645  BP: 133/78 136/69  Pulse: 93 95  Resp: 20 16  Temp:    SpO2: 95% 94%    Last Pain:  Vitals:   03/05/23 1630  PainSc: 10-Worst pain ever                 Mariah Harn,W. EDMOND

## 2023-03-06 DIAGNOSIS — M4316 Spondylolisthesis, lumbar region: Secondary | ICD-10-CM | POA: Diagnosis not present

## 2023-03-06 LAB — GLUCOSE, CAPILLARY: Glucose-Capillary: 132 mg/dL — ABNORMAL HIGH (ref 70–99)

## 2023-03-06 MED ORDER — OXYCODONE HCL 15 MG PO TABS: 15.0000 mg | ORAL_TABLET | Freq: Four times a day (QID) | ORAL | 0 refills | Status: AC | PRN

## 2023-03-06 MED ORDER — METHOCARBAMOL 750 MG PO TABS: 750.0000 mg | ORAL_TABLET | Freq: Four times a day (QID) | ORAL | 0 refills | Status: DC

## 2023-03-06 NOTE — Evaluation (Signed)
Occupational Therapy Evaluation Patient Details Name: Vincent [name] MRN: [id number] DOB: Dec 28, 1955 Today's Date: 03/06/2023   History of Present Illness The pt is a 67 yo male presenting 12/11 for PLIF L3-4. PMH includes: arthritis, CKD, DM II, ACDF C3-6, and back surgery x2.   Clinical Impression   Pt is typically independent in ADL and mobility. Former Nutritional therapist and raises ducks (mallards). Pt able to recall 3/3 back precautions from previous sx. Pt today is largely functioning at supervision level for ADL - both upper and lower body. He needed assist with shoes, and brace. Education provided for don/doff brace and Pt able to complete teachback on brace. Pt provided with elastic shoe laces - and he can perform figure 4 to access LB for bathing/dressing. He has a shower chair that he is going to get his friend to bring into the house today. Reviewed toilet aide and pt able to perform teach back on how to use and maintain precautions. At this time plan on post-acute HHOT to maximize safety and independence - especially since Pt lives alone.       If plan is discharge home, recommend the following: A little help with bathing/dressing/bathroom;Assistance with cooking/housework;Assist for transportation    Functional Status Assessment  Patient has had a recent decline in their functional status and demonstrates the ability to make significant improvements in function in a reasonable and predictable amount of time.  Equipment Recommendations  None recommended by OT (Pt has appropriate DME, provided toilet aide and elastic shoe laces)    Recommendations for Other Services PT consult     Precautions / Restrictions Precautions Precautions: Back Precaution Booklet Issued: Yes (comment) Required Braces or Orthoses: Spinal Brace Spinal Brace: Lumbar corset;Applied in standing position Restrictions Weight Bearing Restrictions Per Provider Order: No      Mobility Bed Mobility Overal bed  mobility: Needs Assistance Bed Mobility: Rolling, Sidelying to Sit Rolling: Supervision Sidelying to sit: Min assist       General bed mobility comments: trunk elevation    Transfers Overall transfer level: Needs assistance Equipment used: None Transfers: Sit to/from Stand Sit to Stand: Supervision           General transfer comment: no LOB, slightly unsteady      Balance Overall balance assessment: Mild deficits observed, not formally tested                                         ADL either performed or assessed with clinical judgement   ADL Overall ADL's : Needs assistance/impaired Eating/Feeding: Independent   Grooming: Supervision/safety   Upper Body Bathing: Supervision/ safety   Lower Body Bathing: Supervison/ safety;Sitting/lateral leans Lower Body Bathing Details (indicate cue type and reason): can perform figure 4 Upper Body Dressing : Modified independent Upper Body Dressing Details (indicate cue type and reason): including brace Lower Body Dressing: Minimal assistance;Sit to/from stand Lower Body Dressing Details (indicate cue type and reason): able to perform everything independently with the exception of shoes. Provided with elastic shoe strings. Toilet Transfer: Ambulation;Supervision/safety   Toileting- Clothing Manipulation and Hygiene: Minimal assistance;Cueing for compensatory techniques;Sitting/lateral lean Toileting - Clothing Manipulation Details (indicate cue type and reason): provided with toilet aide, and verbal, demonstration, and teach back education. Tub/ Shower Transfer: Supervision/safety   Functional mobility during ADLs: Supervision/safety (no DME in room)       Vision Ability to See in Adequate Light:  0 Adequate Patient Visual Report: No change from baseline Vision Assessment?: No apparent visual deficits     Perception         Praxis         Pertinent Vitals/Pain Pain Assessment Pain Assessment:  0-10 Pain Score: 7  Pain Location: back at incision Pain Descriptors / Indicators: Discomfort, Grimacing, Operative site guarding Pain Intervention(s): Limited activity within patient's tolerance, Monitored during session, Repositioned     Extremity/Trunk Assessment Upper Extremity Assessment Upper Extremity Assessment: Overall WFL for tasks assessed   Lower Extremity Assessment Lower Extremity Assessment: Defer to PT evaluation   Cervical / Trunk Assessment Cervical / Trunk Assessment: Back Surgery   Communication Communication Communication: No apparent difficulties   Cognition Arousal: Alert Behavior During Therapy: WFL for tasks assessed/performed Overall Cognitive Status: Within Functional Limits for tasks assessed                                       General Comments       Exercises     Shoulder Instructions      Home Living Family/patient expects to be discharged to:: Private residence Living Arrangements: Alone Available Help at Discharge: Friend(s);Family;Available PRN/intermittently Type of Home: House Home Access: Level entry;Stairs to enter Entrance Stairs-Number of Steps: 5-7 Entrance Stairs-Rails: Right;Left Home Layout: One level     Bathroom Shower/Tub: Tub/shower unit;Curtain   Firefighter: Standard     Home Equipment: Cane - single point;Shower seat   Additional Comments: keeps ducks Industrial/product designer) as pets      Prior Functioning/Environment Prior Level of Function : Independent/Modified Independent             Mobility Comments: worked as a Nutritional therapist for a long time, keeps ducks, no DME typically ADLs Comments: does his own bathing/dressing, doesn't cook much        OT Problem List: Decreased activity tolerance;Decreased knowledge of use of DME or AE;Decreased knowledge of precautions;Pain      OT Treatment/Interventions:      OT Goals(Current goals can be found in the care plan section) Acute Rehab OT  Goals Patient Stated Goal: decrease pain, be able to take care of ducks OT Goal Formulation: With patient Time For Goal Achievement: 03/20/23 Potential to Achieve Goals: Good  OT Frequency:      Co-evaluation              AM-PAC OT "6 Clicks" Daily Activity     Outcome Measure Help from another person eating meals?: None Help from another person taking care of personal grooming?: None Help from another person toileting, which includes using toliet, bedpan, or urinal?: A Little Help from another person bathing (including washing, rinsing, drying)?: None Help from another person to put on and taking off regular upper body clothing?: None Help from another person to put on and taking off regular lower body clothing?: A Little 6 Click Score: 22   End of Session Equipment Utilized During Treatment: Back brace Nurse Communication: Mobility status;Precautions  Activity Tolerance: Patient tolerated treatment well Patient left: in chair  OT Visit Diagnosis: Other abnormalities of gait and mobility (R26.89);Muscle weakness (generalized) (M62.81);Pain Pain - Right/Left:  (central) Pain - part of body:  (back)                Time: 0981-1914 OT Time Calculation (min): 20 min Charges:  OT General Charges $OT Visit: 1 Visit OT Evaluation $  OT Eval Low Complexity: 1 Low  Nyoka Cowden OTR/L Acute Rehabilitation Services Office: [phone]  Evern Bio [facility] 03/06/2023, 9:34 AM

## 2023-03-06 NOTE — Plan of Care (Signed)
Pt given D/C instructions with verbal understanding. Rx's were sent to the pharmacy by MD. Pt's incision is clean and dry with no sign of infection. Pt's IV was removed prior to D/C. Pt D/C'd home via wheelchair per MD order. Pt is stable @ D/C and has no other needs at this time. Jaysean Manville, RN  

## 2023-03-06 NOTE — Progress Notes (Signed)
Pt refused home health therapies at home. He has transportation home and someone to check on him at home.

## 2023-03-06 NOTE — Evaluation (Signed)
Physical Therapy Evaluation Patient Details Name: Vincent [name] MRN: [id number] DOB: [birth year] Today's Date: 03/06/2023  History of Present Illness  The pt is a 67 yo male presenting 12/11 for PLIF L3-4. PMH includes: arthritis, CKD, DM II, ACDF C3-6, and back surgery x2.   Clinical Impression  Pt up in chair upon arrival of PT, agreeable to evaluation at this time. Prior to admission the pt was independent without use of DME, living alone in a home with level entry in the back. The pt now presents with minor limitations in functional mobility, power, stability, and endurance due to above dx and resulting pain, and will continue to benefit from skilled PT to address these deficits. The pt was able to complete sit-stand transfers and hallway ambulation without physical assistance, does rely on UE support to power up to standing and prefers single UE support for balance with walking. Will benefit from continued skilled PT to progress mobility back to full independence without UE support on uneven surfaces to allow for pt goal of independent walking outside. He is safe to return home with intermittent support and HHPT.          If plan is discharge home, recommend the following: A little help with walking and/or transfers;Help with stairs or ramp for entrance   Can travel by private vehicle        Equipment Recommendations None recommended by PT  Recommendations for Other Services       Functional Status Assessment Patient has had a recent decline in their functional status and demonstrates the ability to make significant improvements in function in a reasonable and predictable amount of time.     Precautions / Restrictions Precautions Precautions: Back Precaution Booklet Issued: Yes (comment) Precaution Comments: cues with mobility Required Braces or Orthoses: Spinal Brace Spinal Brace: Lumbar corset;Applied in standing position Restrictions Weight Bearing Restrictions Per Provider  Order: No      Mobility  Bed Mobility Overal bed mobility: Needs Assistance             General bed mobility comments: pt OOB with OT upon arrival    Transfers Overall transfer level: Needs assistance Equipment used: None Transfers: Sit to/from Stand Sit to Stand: Supervision           General transfer comment: no LOB, slightly unsteady    Ambulation/Gait Ambulation/Gait assistance: Contact guard assist Gait Distance (Feet): 250 Feet Assistive device: None (hallway rail) Gait Pattern/deviations: Step-through pattern, Decreased stride length Gait velocity: decreased Gait velocity interpretation: <1.31 ft/sec, indicative of household ambulator   General Gait Details: pt with slightly increased sway, no overt LOB. prefers single UE support on hallway rail. discussed use of cane at home    Balance Overall balance assessment: Mild deficits observed, not formally tested                                           Pertinent Vitals/Pain Pain Assessment Pain Assessment: Faces Faces Pain Scale: Hurts even more Pain Location: back at incision Pain Descriptors / Indicators: Discomfort, Grimacing, Operative site guarding    Home Living Family/patient expects to be discharged to:: Private residence Living Arrangements: Alone Available Help at Discharge: Friend(s);Family;Available PRN/intermittently Type of Home: House Home Access: Level entry;Stairs to enter Entrance Stairs-Rails: Right;Left Entrance Stairs-Number of Steps: 5-7   Home Layout: One level Home Equipment: Cane - single point;Shower seat Additional Comments:  keeps ducks Industrial/product designer) as pets    Prior Function Prior Level of Function : Independent/Modified Independent             Mobility Comments: worked as a Nutritional therapist for a long time, keeps ducks, no DME typically, no falls, no exercise, just activity ADLs Comments: does his own bathing/dressing, doesn't cook much      Extremity/Trunk Assessment   Upper Extremity Assessment Upper Extremity Assessment: Defer to OT evaluation    Lower Extremity Assessment Lower Extremity Assessment: Overall WFL for tasks assessed (reports no numbness like prior ot surgery. hx of bilateral neuropathy)    Cervical / Trunk Assessment Cervical / Trunk Assessment: Back Surgery  Communication   Communication Communication: No apparent difficulties  Cognition Arousal: Alert Behavior During Therapy: WFL for tasks assessed/performed Overall Cognitive Status: Within Functional Limits for tasks assessed                                          General Comments General comments (skin integrity, edema, etc.): VSS on RA, pt educated on spinal precautions and progressive walking    Exercises     Assessment/Plan    PT Assessment Patient needs continued PT services  PT Problem List Decreased strength;Decreased balance;Decreased activity tolerance;Decreased mobility;Pain       PT Treatment Interventions DME instruction;Gait training;Stair training;Functional mobility training;Therapeutic activities;Therapeutic exercise;Balance training    PT Goals (Current goals can be found in the Care Plan section)  Acute Rehab PT Goals Patient Stated Goal: to return to walking outside without cane PT Goal Formulation: With patient Time For Goal Achievement: 03/20/23 Potential to Achieve Goals: Good    Frequency Min 5X/week        AM-PAC PT "6 Clicks" Mobility  Outcome Measure Help needed turning from your back to your side while in a flat bed without using bedrails?: A Little Help needed moving from lying on your back to sitting on the side of a flat bed without using bedrails?: A Little Help needed moving to and from a bed to a chair (including a wheelchair)?: A Little Help needed standing up from a chair using your arms (e.g., wheelchair or bedside chair)?: A Little Help needed to walk in hospital room?:  A Little Help needed climbing 3-5 steps with a railing? : A Little 6 Click Score: 18    End of Session Equipment Utilized During Treatment: Gait belt;Back brace Activity Tolerance: Patient tolerated treatment well Patient left: in chair;with call bell/phone within reach Nurse Communication: Mobility status PT Visit Diagnosis: Unsteadiness on feet (R26.81);Other abnormalities of gait and mobility (R26.89);Muscle weakness (generalized) (M62.81);Pain Pain - part of body:  (back)    Time: 1610-9604 PT Time Calculation (min) (ACUTE ONLY): 18 min   Charges:   PT Evaluation $PT Eval Low Complexity: 1 Low   PT General Charges $$ ACUTE PT VISIT: 1 Visit         Vickki Muff, PT, DPT   Acute Rehabilitation Department Office [phone] Secure Chat Communication Preferred  Ronnie Derby 03/06/2023, 11:40 AM

## 2023-03-06 NOTE — Discharge Summary (Signed)
Physician Discharge Summary  Patient ID: Vincent [name] MRN: [id number] DOB/AGE: 1955-08-03 67 y.o.  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    Discharge Diagnoses: same   Discharged Condition: good  Hospital Course: The patient was admitted on 03/05/2023 and taken to the operating room where the patient underwent PLIF L3-4. The patient tolerated the procedure well and was taken to the recovery room and then to the floor in stable condition. The hospital course was routine. There were no complications. The wound remained clean dry and intact. Pt had appropriate back soreness. No complaints of leg pain or new N/T/W. The patient remained afebrile with stable vital signs, and tolerated a regular diet. The patient continued to increase activities, and pain was well controlled with oral pain medications.   Consults: None  Significant Diagnostic Studies:  Results for orders placed or performed during the hospital encounter of 03/05/23  Glucose, capillary   Collection Time: 03/05/23 10:11 AM  Result Value Ref Range   Glucose-Capillary 131 (H) 70 - 99 mg/dL  Glucose, capillary   Collection Time: 03/05/23 12:19 PM  Result Value Ref Range   Glucose-Capillary 129 (H) 70 - 99 mg/dL  Glucose, capillary   Collection Time: 03/05/23  2:43 PM  Result Value Ref Range   Glucose-Capillary 125 (H) 70 - 99 mg/dL  Glucose, capillary   Collection Time: 03/05/23  4:08 PM  Result Value Ref Range   Glucose-Capillary 128 (H) 70 - 99 mg/dL  Glucose, capillary   Collection Time: 03/05/23  9:01 PM  Result Value Ref Range   Glucose-Capillary 193 (H) 70 - 99 mg/dL   Comment 1 Notify RN    Comment 2 Document in Chart   Glucose, capillary   Collection Time: 03/06/23  6:11 AM  Result Value Ref Range   Glucose-Capillary 132 (H) 70 - 99 mg/dL   Comment 1 Notify RN    Comment 2  Document in Chart     DG Lumbar Spine 2-3 Views Result Date: 03/05/2023 CLINICAL DATA:  Elective surgery. EXAM: LUMBAR SPINE - 2-3 VIEW COMPARISON:  Preoperative imaging FINDINGS: Three fluoroscopic spot views of the lumbar spine obtained in the operating room. New posterior rod with intrapedicular screw fusion L3-L4 with interbody spacer. Fluoroscopy time 1 minutes. Dose 36.56 mGy. IMPRESSION: Intraoperative fluoroscopy during L3-L4 fusion. Electronically Signed   By: Narda Rutherford M.D.   On: 03/05/2023 17:21   DG C-Arm 1-60 Min-No Report Result Date: 03/05/2023 Fluoroscopy was utilized by the requesting physician.  No radiographic interpretation.   DG C-Arm 1-60 Min-No Report Result Date: 03/05/2023 Fluoroscopy was utilized by the requesting physician.  No radiographic interpretation.    Antibiotics:  Anti-infectives (From admission, onward)    Start     Dose/Rate Route Frequency Ordered Stop   03/05/23 2100  ceFAZolin (ANCEF) IVPB 2g/100 mL premix        2 g 200 mL/hr over 30 Minutes Intravenous Every 8 hours 03/05/23 1820 03/06/23 0430   03/05/23 1100  vancomycin (VANCOCIN) IVPB 1000 mg/200 mL premix        1,000 mg 200 mL/hr over 60 Minutes Intravenous 60 min pre-op 03/05/23 1015 03/05/23 1150   03/05/23 1045  ceFAZolin (ANCEF) IVPB 2g/100 mL premix        2 g 200 mL/hr over 30 Minutes Intravenous On call to O.R. 03/05/23 1014 03/05/23 1338   03/05/23 1025  ceFAZolin (ANCEF) 2-4 GM/100ML-%  IVPB       Note to Pharmacy: Gleason, Ginger E: cabinet override      03/05/23 1025 03/05/23 1320       Discharge Exam: Blood pressure 124/64, pulse 92, temperature 99.9 F (37.7 C), temperature source Oral, resp. rate 18, height 5\' 10"  (1.778 m), weight 72.6 kg, SpO2 99%. Neurologic: Grossly normal Ambulating and voiding well incision cdi   Discharge Medications:   Allergies as of 03/06/2023       Reactions   Ivp Dye [iodinated Contrast Media] Nausea And Vomiting   Severe  vomiting   Penicillins Nausea And Vomiting        Medication List     STOP taking these medications    oxyCODONE-acetaminophen 10-325 MG tablet Commonly known as: Percocet       TAKE these medications    blood glucose meter kit and supplies Dispense based on patient and insurance preference. Use up to four times daily as directed. (FOR ICD-10 E11.8).   diclofenac 75 MG EC tablet Commonly known as: VOLTAREN Take 75 mg by mouth 2 (two) times daily.   diclofenac Sodium 1 % Gel Commonly known as: VOLTAREN Apply 1 application topically daily as needed (back pain).   gabapentin 300 MG capsule Commonly known as: NEURONTIN Take 300-600 mg by mouth 4 (four) times daily.   metFORMIN 500 MG 24 hr tablet Commonly known as: GLUCOPHAGE-XR Take 500 mg by mouth 2 (two) times daily.   methocarbamol 750 MG tablet Commonly known as: Robaxin-750 Take 1 tablet (750 mg total) by mouth 4 (four) times daily.   oxyCODONE 15 MG immediate release tablet Commonly known as: ROXICODONE Take 1 tablet (15 mg total) by mouth every 6 (six) hours as needed for pain. What changed: when to take this   tamsulosin 0.4 MG Caps capsule Commonly known as: FLOMAX Take 0.4 mg by mouth daily after supper.               Durable Medical Equipment  (From admission, onward)           Start     Ordered   03/05/23 1821  DME Walker rolling  Once       Question:  Patient needs a walker to treat with the following condition  Answer:  S/P lumbar fusion   03/05/23 1820   03/05/23 1821  DME 3 n 1  Once        03/05/23 1820            Disposition: home   Final Dx: PLIF L3-4  Discharge Instructions      Remove dressing in 72 hours   Complete by: As directed    Call MD for:   Complete by: As directed    Call MD for:  difficulty breathing, headache or visual disturbances   Complete by: As directed    Call MD for:  hives   Complete by: As directed    Call MD for:  persistant nausea and  vomiting   Complete by: As directed    Call MD for:  redness, tenderness, or signs of infection (pain, swelling, redness, odor or green/yellow discharge around incision site)   Complete by: As directed    Call MD for:  severe uncontrolled pain   Complete by: As directed    Call MD for:  temperature >100.4   Complete by: As directed    Diet - low sodium heart healthy   Complete by: As directed    Driving Restrictions  Complete by: As directed    No driving for 2 weeks, no riding in the car for 1 week   Increase activity slowly   Complete by: As directed    Lifting restrictions   Complete by: As directed    No lifting more than 8 lbs          Signed: Tiana Loft Adrienne Trombetta 03/06/2023, 7:47 AM

## 2023-06-30 ENCOUNTER — Other Ambulatory Visit: Payer: Self-pay

## 2023-06-30 ENCOUNTER — Inpatient Hospital Stay (HOSPITAL_COMMUNITY)

## 2023-06-30 ENCOUNTER — Inpatient Hospital Stay (HOSPITAL_COMMUNITY)
Admission: EM | Admit: 2023-06-30 | Discharge: 2023-07-05 | DRG: 177 | Disposition: A | Discharge status: Home / Self Care | Attending: Internal Medicine | Admitting: Internal Medicine

## 2023-06-30 ENCOUNTER — Encounter (HOSPITAL_COMMUNITY): Payer: Self-pay | Admitting: *Deleted

## 2023-06-30 ENCOUNTER — Emergency Department (HOSPITAL_COMMUNITY)

## 2023-06-30 DIAGNOSIS — Z79899 Other long term (current) drug therapy: Secondary | ICD-10-CM

## 2023-06-30 DIAGNOSIS — Z91041 Radiographic dye allergy status: Secondary | ICD-10-CM

## 2023-06-30 DIAGNOSIS — U099 Post covid-19 condition, unspecified: Secondary | ICD-10-CM | POA: Diagnosis present

## 2023-06-30 DIAGNOSIS — E871 Hypo-osmolality and hyponatremia: Secondary | ICD-10-CM | POA: Diagnosis present

## 2023-06-30 DIAGNOSIS — J850 Gangrene and necrosis of lung: Secondary | ICD-10-CM | POA: Diagnosis present

## 2023-06-30 DIAGNOSIS — R059 Cough, unspecified: Secondary | ICD-10-CM | POA: Diagnosis present

## 2023-06-30 DIAGNOSIS — R1314 Dysphagia, pharyngoesophageal phase: Secondary | ICD-10-CM | POA: Diagnosis present

## 2023-06-30 DIAGNOSIS — Z7984 Long term (current) use of oral hypoglycemic drugs: Secondary | ICD-10-CM | POA: Diagnosis not present

## 2023-06-30 DIAGNOSIS — Z85828 Personal history of other malignant neoplasm of skin: Secondary | ICD-10-CM

## 2023-06-30 DIAGNOSIS — Z87891 Personal history of nicotine dependence: Secondary | ICD-10-CM

## 2023-06-30 DIAGNOSIS — Z880 Allergy status to penicillin: Secondary | ICD-10-CM | POA: Diagnosis not present

## 2023-06-30 DIAGNOSIS — D631 Anemia in chronic kidney disease: Secondary | ICD-10-CM | POA: Diagnosis present

## 2023-06-30 DIAGNOSIS — E1165 Type 2 diabetes mellitus with hyperglycemia: Secondary | ICD-10-CM | POA: Diagnosis present

## 2023-06-30 DIAGNOSIS — Z981 Arthrodesis status: Secondary | ICD-10-CM

## 2023-06-30 DIAGNOSIS — R42 Dizziness and giddiness: Secondary | ICD-10-CM | POA: Diagnosis present

## 2023-06-30 DIAGNOSIS — N1832 Chronic kidney disease, stage 3b: Secondary | ICD-10-CM | POA: Diagnosis present

## 2023-06-30 DIAGNOSIS — M549 Dorsalgia, unspecified: Secondary | ICD-10-CM | POA: Diagnosis present

## 2023-06-30 DIAGNOSIS — R61 Generalized hyperhidrosis: Secondary | ICD-10-CM | POA: Diagnosis present

## 2023-06-30 DIAGNOSIS — N179 Acute kidney failure, unspecified: Secondary | ICD-10-CM | POA: Diagnosis present

## 2023-06-30 DIAGNOSIS — M545 Low back pain, unspecified: Secondary | ICD-10-CM

## 2023-06-30 DIAGNOSIS — E118 Type 2 diabetes mellitus with unspecified complications: Secondary | ICD-10-CM

## 2023-06-30 DIAGNOSIS — F1190 Opioid use, unspecified, uncomplicated: Secondary | ICD-10-CM | POA: Diagnosis present

## 2023-06-30 DIAGNOSIS — R634 Abnormal weight loss: Secondary | ICD-10-CM | POA: Diagnosis present

## 2023-06-30 DIAGNOSIS — J189 Pneumonia, unspecified organism: Principal | ICD-10-CM

## 2023-06-30 DIAGNOSIS — R112 Nausea with vomiting, unspecified: Secondary | ICD-10-CM | POA: Diagnosis present

## 2023-06-30 DIAGNOSIS — Z79891 Long term (current) use of opiate analgesic: Secondary | ICD-10-CM

## 2023-06-30 DIAGNOSIS — E860 Dehydration: Secondary | ICD-10-CM

## 2023-06-30 DIAGNOSIS — U071 COVID-19: Secondary | ICD-10-CM | POA: Diagnosis present

## 2023-06-30 DIAGNOSIS — E1122 Type 2 diabetes mellitus with diabetic chronic kidney disease: Secondary | ICD-10-CM | POA: Diagnosis present

## 2023-06-30 DIAGNOSIS — Z681 Body mass index (BMI) 19 or less, adult: Secondary | ICD-10-CM

## 2023-06-30 DIAGNOSIS — G8929 Other chronic pain: Secondary | ICD-10-CM | POA: Diagnosis present

## 2023-06-30 DIAGNOSIS — Z8701 Personal history of pneumonia (recurrent): Secondary | ICD-10-CM

## 2023-06-30 DIAGNOSIS — J181 Lobar pneumonia, unspecified organism: Secondary | ICD-10-CM | POA: Diagnosis not present

## 2023-06-30 DIAGNOSIS — J851 Abscess of lung with pneumonia: Secondary | ICD-10-CM | POA: Diagnosis present

## 2023-06-30 DIAGNOSIS — R1310 Dysphagia, unspecified: Secondary | ICD-10-CM | POA: Diagnosis not present

## 2023-06-30 LAB — CBC
HCT: 34.4 % — ABNORMAL LOW (ref 39.0–52.0)
Hemoglobin: 10.8 g/dL — ABNORMAL LOW (ref 13.0–17.0)
MCH: 24.5 pg — ABNORMAL LOW (ref 26.0–34.0)
MCHC: 31.4 g/dL (ref 30.0–36.0)
MCV: 78 fL — ABNORMAL LOW (ref 80.0–100.0)
Platelets: 198 10*3/uL (ref 150–400)
RBC: 4.41 MIL/uL (ref 4.22–5.81)
RDW: 15.4 % (ref 11.5–15.5)
WBC: 10.6 10*3/uL — ABNORMAL HIGH (ref 4.0–10.5)
nRBC: 0 % (ref 0.0–0.2)

## 2023-06-30 LAB — BASIC METABOLIC PANEL WITH GFR
Anion gap: 12 (ref 5–15)
BUN: 28 mg/dL — ABNORMAL HIGH (ref 8–23)
CO2: 25 mmol/L (ref 22–32)
Calcium: 8.7 mg/dL — ABNORMAL LOW (ref 8.9–10.3)
Chloride: 92 mmol/L — ABNORMAL LOW (ref 98–111)
Creatinine, Ser: 2.1 mg/dL — ABNORMAL HIGH (ref 0.61–1.24)
GFR, Estimated: 34 mL/min — ABNORMAL LOW (ref >60)
Glucose, Bld: 207 mg/dL — ABNORMAL HIGH (ref 70–99)
Potassium: 3.9 mmol/L (ref 3.5–5.1)
Sodium: 129 mmol/L — ABNORMAL LOW (ref 135–145)

## 2023-06-30 LAB — I-STAT CG4 LACTIC ACID, ED: Lactic Acid, Venous: 1.6 mmol/L (ref 0.5–1.9)

## 2023-06-30 LAB — TROPONIN I (HIGH SENSITIVITY)
Troponin I (High Sensitivity): 7 ng/L (ref <18)
Troponin I (High Sensitivity): 7 ng/L (ref <18)

## 2023-06-30 MED ORDER — ONDANSETRON HCL 4 MG/2ML IJ SOLN
4.0000 mg | Freq: Three times a day (TID) | INTRAMUSCULAR | Status: DC | PRN
  Administered 2023-07-02 – 2023-07-04 (×3): 4 mg via INTRAVENOUS
  Filled 2023-06-30 (×3): qty 2

## 2023-06-30 MED ORDER — SODIUM CHLORIDE 0.9 % IV SOLN
500.0000 mg | INTRAVENOUS | Status: DC
  Administered 2023-07-01 – 2023-07-04 (×3): 500 mg via INTRAVENOUS
  Filled 2023-06-30 (×4): qty 5

## 2023-06-30 MED ORDER — SODIUM CHLORIDE 0.9 % IV SOLN
500.0000 mg | Freq: Once | INTRAVENOUS | Status: AC
  Administered 2023-06-30: 500 mg via INTRAVENOUS
  Filled 2023-06-30: qty 5

## 2023-06-30 MED ORDER — ONDANSETRON HCL 4 MG/2ML IJ SOLN
4.0000 mg | Freq: Once | INTRAMUSCULAR | Status: AC
  Administered 2023-06-30: 4 mg via INTRAVENOUS
  Filled 2023-06-30: qty 2

## 2023-06-30 MED ORDER — OXYCODONE HCL 5 MG PO TABS
15.0000 mg | ORAL_TABLET | Freq: Four times a day (QID) | ORAL | Status: DC | PRN
  Administered 2023-06-30 – 2023-07-05 (×14): 15 mg via ORAL
  Filled 2023-06-30 (×15): qty 3

## 2023-06-30 MED ORDER — ACETAMINOPHEN 500 MG PO TABS: 500.0000 mg | ORAL_TABLET | Freq: Four times a day (QID) | ORAL | Status: DC | PRN

## 2023-06-30 MED ORDER — SODIUM CHLORIDE 0.9 % IV SOLN
2.0000 g | INTRAVENOUS | Status: DC
  Administered 2023-07-01 – 2023-07-02 (×2): 2 g via INTRAVENOUS
  Filled 2023-06-30 (×2): qty 20

## 2023-06-30 MED ORDER — FLORANEX PO PACK
1.0000 g | PACK | Freq: Three times a day (TID) | ORAL | Status: DC
  Administered 2023-07-01 – 2023-07-04 (×11): 1 g via ORAL
  Filled 2023-06-30 (×16): qty 1

## 2023-06-30 MED ORDER — LACTATED RINGERS IV BOLUS
1000.0000 mL | Freq: Once | INTRAVENOUS | Status: AC
  Administered 2023-06-30: 1000 mL via INTRAVENOUS

## 2023-06-30 MED ORDER — MELATONIN 3 MG PO TABS: 3.0000 mg | ORAL_TABLET | Freq: Every evening | ORAL | Status: DC | PRN

## 2023-06-30 MED ORDER — ENOXAPARIN SODIUM 30 MG/0.3ML IJ SOSY
30.0000 mg | PREFILLED_SYRINGE | INTRAMUSCULAR | Status: DC
  Filled 2023-06-30: qty 0.3

## 2023-06-30 MED ORDER — SENNA 8.6 MG PO TABS: 2.0000 | ORAL_TABLET | Freq: Every day | ORAL | Status: DC | PRN

## 2023-06-30 MED ORDER — SODIUM CHLORIDE 0.9 % IV SOLN
1.0000 g | Freq: Once | INTRAVENOUS | Status: AC
Start: 2023-06-30 — End: 2023-06-30
  Administered 2023-06-30: 1 g via INTRAVENOUS
  Filled 2023-06-30: qty 10

## 2023-06-30 MED ORDER — DICLOFENAC SODIUM 1 % EX GEL: 2.0000 g | Freq: Every day | CUTANEOUS | Status: DC | PRN

## 2023-06-30 MED ORDER — IBUPROFEN 200 MG PO TABS
400.0000 mg | ORAL_TABLET | Freq: Three times a day (TID) | ORAL | Status: DC
  Administered 2023-07-01: 400 mg via ORAL
  Filled 2023-06-30: qty 2

## 2023-06-30 MED ORDER — SODIUM CHLORIDE 0.9 % IV SOLN: 2.0000 g | INTRAVENOUS | Status: DC

## 2023-06-30 NOTE — H&P (Signed)
 History and Physical    Patient: Vincent [name] ZOX:[id number] DOB: 06/05/1955 DOA: 06/30/2023 DOS: the patient was seen and examined on 06/30/2023 PCP: [name], MD  Patient coming from: Home  Chief Complaint:  Chief Complaint  Patient presents with   Cough   HPI: Vincent [name] is a 68 y.o. male with medical history significant for right lower lobe cavitary pneumonia 2 and half years ago, Dmt2, and oxycodone use due to chronic back pain sent in by his primary provider because the patient has been vomiting and cannot keep the oral antibiotics that he was prescribed down. Patient describes severe productive cough and drenching night sweats for the last 3 weeks.  He also has had no appetite and recurrent vomiting.  He says no bowel movement in the last 2 weeks.  No diarrhea.  This is exactly how he felt prior to his last hospitalization for pneumonia 2-1/2 years ago.  He was diagnosed with a cavitary pneumonia at that time and ended up having bronchoscopy. Patient tells me that TB was ruled out and eventually he got better just with a prolonged course of antibiotics.  He is 4 months status post decompressive lumbar laminectomy. Patient does follow with pain management for his narcotics.  Review of Systems: As mentioned in the history of present illness. All other systems reviewed and are negative. 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    Nerve pain    per patient, both legs   Pneumonia    Past Surgical History:  Procedure Laterality Date   ANTERIOR  CERVICAL DECOMP/DISCECTOMY FUSION N/A 05/20/2022   Procedure: ACDF - C3-C4 - C4-C5 - C5-C6;  Surgeon: Tia Alert, MD;  Location: [facility] OR;  Service: Neurosurgery;  Laterality: N/A;   BACK SURGERY     x2   BRONCHIAL BIOPSY  03/15/2021   Procedure: BRONCHIAL BIOPSIES;  Surgeon: Tomma Lightning, MD;  Location: [facility] ENDOSCOPY;  Service: Pulmonary;;   BRONCHIAL WASHINGS  03/15/2021   Procedure: BRONCHIAL WASHINGS;  Surgeon: Tomma Lightning, MD;  Location: [facility] ENDOSCOPY;  Service: Pulmonary;;   HERNIA REPAIR     umbilical and right inguinal hernia   INCISION AND DRAINAGE OF WOUND Left 10/27/2016   Procedure: IRRIGATION AND DEBRIDEMENT LEFT PATELLA;  Surgeon: Cammy Copa, MD;  Location: [facility] OR;  Service: Orthopedics;  Laterality: Left;   LESION REMOVAL Left 11/01/2019   Procedure: LEFT FACIAL LESION REMOVAL;  Surgeon: Serena Colonel, MD;  Location: [facility] OR;  Service: ENT;  Laterality: Left;   RADIOLOGY WITH ANESTHESIA N/A 02/05/2022   Procedure: MRI WITH ANESTHESIA OF CERVICAL SPINE WITHOUT CONTRAST;  Surgeon: Radiologist, Medication, MD;  Location: [facility] OR;  Service: Radiology;  Laterality: N/A;   SPINE SURGERY     x2   VIDEO BRONCHOSCOPY N/A 03/15/2021   Procedure: VIDEO BRONCHOSCOPY WITH FLUORO;  Surgeon: Tomma Lightning, MD;  Location: [facility] ENDOSCOPY;  Service: Pulmonary;  Laterality: N/A;   Social History:  reports that he quit smoking about 35  years ago. His smoking use included cigarettes. He has been exposed to tobacco smoke. He has never used smokeless tobacco. He reports that he does not drink alcohol and does not use drugs.  Allergies  Allergen Reactions   Ivp Dye [Iodinated Contrast Media] Nausea And Vomiting    Severe vomiting   Penicillins Nausea And Vomiting    History reviewed. No pertinent family history.  Prior to Admission medications   Medication Sig Start Date End Date Taking? Authorizing Provider  amoxicillin-clavulanate (AUGMENTIN) 875-125 MG tablet Take 1  tablet by mouth every 12 (twelve) hours.   Yes [provider]  diclofenac (VOLTAREN) 75 MG EC tablet Take 75 mg by mouth 2 (two) times daily.  02/18/16  Yes [provider]  diclofenac Sodium (VOLTAREN) 1 % GEL Apply 1 application topically daily as needed (back pain).   Yes [provider]  ondansetron (ZOFRAN) 8 MG tablet Take 8 mg by mouth every 8 (eight) hours as needed. 06/18/23  Yes [provider]  oxyCODONE (ROXICODONE) 15 MG immediate release tablet Take 1 tablet (15 mg total) by mouth every 6 (six) hours as needed for pain. 03/06/23  Yes Meyran, Tiana Loft, NP  azithromycin (ZITHROMAX) 250 MG tablet Take 250 mg by mouth as directed. Patient not taking: Reported on 06/30/2023 06/23/23   [provider]  blood glucose meter kit and supplies Dispense based on patient and insurance preference. Use up to four times daily as directed. (FOR ICD-10 E11.8). 10/29/16   Johnson, Clanford L, MD  gabapentin (NEURONTIN) 300 MG capsule Take 300-600 mg by mouth 4 (four) times daily. Patient not taking: Reported on 06/30/2023    [provider]  metFORMIN (GLUCOPHAGE-XR) 500 MG 24 hr tablet Take 500 mg by mouth 2 (two) times daily. Patient not taking: Reported on 06/30/2023    [provider]  tamsulosin (FLOMAX) 0.4 MG CAPS capsule Take 0.4 mg by mouth daily after supper. Patient not taking: Reported on 06/30/2023    [provider]    Physical Exam: Vitals:   06/30/23 1424  BP: 102/62  Pulse: (!) 113  Resp: 16  Temp: 98.3 F (36.8 C)  SpO2: 100%    Data Reviewed:  Physical Exam:  General: No acute distress, well developed, well nourished HEENT: Normocephalic, atraumatic, PERRL Cardiovascular: Normal rate and rhythm. Distal pulses intact. Pulmonary: Normal pulmonary effort, normal breath sounds Gastrointestinal: Nondistended abdomen, soft, non-tender, normoactive bowel sounds, no organomegaly Musculoskeletal:Normal ROM,  no lower ext edema Lymphadenopathy: No cervical LAD. Skin: Skin is warm and dry. Neuro: No focal deficits noted, AAOx3. PSYCH: Attentive and cooperative Results for orders placed or performed during the hospital encounter of 06/30/23 (from the past 24 hours)  Basic metabolic panel     Status: Abnormal   Collection Time: 06/30/23  2:29 PM  Result Value Ref Range   Sodium 129 (L) 135 - 145 mmol/L   Potassium 3.9 3.5 - 5.1 mmol/L   Chloride 92 (L) 98 - 111 mmol/L   CO2 25 22 - 32 mmol/L   Glucose, Bld 207 (H) 70 - 99 mg/dL   BUN 28 (H) 8 - 23 mg/dL   Creatinine, Ser 4.09 (H) 0.61 - 1.24 mg/dL   Calcium 8.7 (L) 8.9 - 10.3 mg/dL   GFR, Estimated 34 (L) >60 mL/min   Anion gap 12 5 - 15  CBC     Status: Abnormal   Collection Time: 06/30/23  2:29 PM  Result Value Ref Range   WBC 10.6 (  H) 4.0 - 10.5 K/uL   RBC 4.41 4.22 - 5.81 MIL/uL   Hemoglobin 10.8 (L) 13.0 - 17.0 g/dL   HCT 91.4 (L) 78.2 - 95.6 %   MCV 78.0 (L) 80.0 - 100.0 fL   MCH 24.5 (L) 26.0 - 34.0 pg   MCHC 31.4 30.0 - 36.0 g/dL   RDW 21.3 08.6 - 57.8 %   Platelets 198 150 - 400 K/uL   nRBC 0.0 0.0 - 0.2 %  Troponin I (High Sensitivity)     Status: None   Collection Time: 06/30/23  2:29 PM  Result Value Ref Range   Troponin I (High Sensitivity) 7 <18 ng/L  I-Stat CG4 Lactic Acid     Status: None   Collection Time: 06/30/23  2:46 PM  Result Value Ref Range   Lactic Acid, Venous 1.6 0.5 - 1.9 mmol/L     Assessment and Plan: RLL pneumonia, failed outpatient treatment/ h/o cavitary RLL pna - -patient received Rocephin and Zithromax in the emergency department - Awaiting CT findings to decide on antibiotics going forward.  2.  Recurrent vomiting- I suspect constipation given his chronic narcotic use and dehydration. He reports no bowel movement in over 2 weeks but says he does not feel constipated. - CT pending  3. Mild AKI - IV Fluids  4. Hyponatremia - likely due to volume depletion.   - IV fluids - Follow  5.  DMT2 - Hold Metformin after CT. - Corrective dose insulin    Advance Care Planning:   Code Status: Full Code the patient wants be full code and names his longtime friend Lucrezia Europe as his Social research officer, government.  Consults: none  Family Communication: none  Severity of Illness: The appropriate patient status for this patient is INPATIENT. Inpatient status is judged to be reasonable and necessary in order to provide the required intensity of service to ensure the patient's safety. The patient's presenting symptoms, physical exam findings, and initial radiographic and laboratory data in the context of their chronic comorbidities is felt to place them at high risk for further clinical deterioration. Furthermore, it is not anticipated that the patient will be medically stable for discharge from the hospital within 2 midnights of admission.   * I certify that at the point of admission it is my clinical judgment that the patient will require inpatient hospital care spanning beyond 2 midnights from the point of admission due to high intensity of service, high risk for further deterioration and high frequency of surveillance required.*  Author: Buena Irish, MD 06/30/2023 6:45 PM  For on call review [email or web address].

## 2023-06-30 NOTE — ED Notes (Signed)
 LR administration delayed. IV antibiotics NOT compatible with LR.    One point of IV access at this time.

## 2023-06-30 NOTE — ED Provider Notes (Signed)
 [place] [facility] DEPARTMENT AT [facility] Provider Note   CSN: [id number] Arrival date & time: 06/30/23  1416     History  Chief Complaint  Patient presents with   Cough    Vincent [name] is a 68 y.o. male.  Pt with c/o recurrent nausea/vomiting in past 1.5 months, and recurrent/persistent cough in past two weeks. Notes feels similar to as when had pneumonia in past. Prior to cough starting, denies any choking or aspirating events. No specific known ill contacts. +greenish phlegm. No sore throat or runny nose. No chest pain. Dyspnea w exertion or coughing. Former smoker. No drug use. Denies headache. No abd pain. No gu c/o. No extremity pain or swelling. Pcp had given rx augmentin  but has not tolerated due to reporting recurrent nv - indicates pcp wants admitted for ivf and iv antibiotics. No wt loss. No night sweats.   The history is provided by the patient and medical records.  Cough Associated symptoms: fever   Associated symptoms: no chest pain, no headaches, no rash and no sore throat        Home Medications Prior to Admission medications   Medication Sig Start Date End Date Taking? Authorizing Provider  blood glucose meter kit and supplies Dispense based on patient and insurance preference. Use up to four times daily as directed. (FOR ICD-10 E11.8). 10/29/16   Johnson, Clanford L, MD  diclofenac  (VOLTAREN ) 75 MG EC tablet Take 75 mg by mouth 2 (two) times daily.  02/18/16   [provider]  diclofenac  Sodium (VOLTAREN ) 1 % GEL Apply 1 application topically daily as needed (back pain).    [provider]  gabapentin  (NEURONTIN ) 300 MG capsule Take 300-600 mg by mouth 4 (four) times daily.    [provider]  metFORMIN  (GLUCOPHAGE -XR) 500 MG 24 hr tablet Take 500 mg by mouth 2 (two) times daily.    [provider]  methocarbamol  (ROBAXIN -750) 750 MG tablet Take 1 tablet (750 mg total) by mouth 4 (four) times daily. 03/06/23    Meyran, Kenard Paul, NP  oxyCODONE  (ROXICODONE ) 15 MG immediate release tablet Take 1 tablet (15 mg total) by mouth every 6 (six) hours as needed for pain. 03/06/23   Meyran, Kenard Paul, NP  tamsulosin  (FLOMAX ) 0.4 MG CAPS capsule Take 0.4 mg by mouth daily after supper.    [provider]      Allergies    Ivp dye [iodinated contrast media] and Penicillins    Review of Systems   Review of Systems  Constitutional:  Positive for fever.  HENT:  Negative for sore throat.   Eyes:  Negative for redness.  Respiratory:  Positive for cough.   Cardiovascular:  Negative for chest pain and leg swelling.  Gastrointestinal:  Positive for nausea and vomiting. Negative for abdominal distention, abdominal pain, constipation and diarrhea.  Genitourinary:  Negative for dysuria and flank pain.  Musculoskeletal:  Negative for back pain, neck pain and neck stiffness.  Skin:  Negative for rash.  Neurological:  Negative for headaches.    Physical Exam Updated Vital Signs BP 102/62   Pulse (!) 113   Temp 98.3 F (36.8 C)   Resp 16   SpO2 100%  Physical Exam Vitals and nursing note reviewed.  Constitutional:      Appearance: Normal appearance. He is well-developed.  HENT:     Head: Atraumatic.     Nose: Nose normal.     Mouth/Throat:     Mouth: Mucous membranes  are moist.     Pharynx: Oropharynx is clear. No oropharyngeal exudate or posterior oropharyngeal erythema.  Eyes:     General: No scleral icterus.    Conjunctiva/sclera: Conjunctivae normal.  Neck:     Trachea: No tracheal deviation.  Cardiovascular:     Rate and Rhythm: Regular rhythm. Tachycardia present.     Pulses: Normal pulses.     Heart sounds: Normal heart sounds. No murmur heard.    No friction rub. No gallop.  Pulmonary:     Effort: Pulmonary effort is normal. No accessory muscle usage or respiratory distress.     Breath sounds: Rhonchi present.  Abdominal:     General: Bowel sounds are normal. There is  no distension.     Palpations: Abdomen is soft. There is no mass.     Tenderness: There is no abdominal tenderness. There is no guarding or rebound.     Hernia: No hernia is present.  Genitourinary:    Comments: No cva tenderness. Musculoskeletal:        General: No swelling or tenderness.     Cervical back: Normal range of motion and neck supple. No rigidity.     Right lower leg: No edema.     Left lower leg: No edema.  Skin:    General: Skin is warm and dry.     Findings: No rash.  Neurological:     Mental Status: He is alert.     Comments: Alert, speech clear.   Psychiatric:        Mood and Affect: Mood normal.     ED Results / Procedures / Treatments   Labs (all labs ordered are listed, but only abnormal results are displayed) Results for orders placed or performed during the hospital encounter of 06/30/23  Basic metabolic panel   Collection Time: 06/30/23  2:29 PM  Result Value Ref Range   Sodium 129 (L) 135 - 145 mmol/L   Potassium 3.9 3.5 - 5.1 mmol/L   Chloride 92 (L) 98 - 111 mmol/L   CO2 25 22 - 32 mmol/L   Glucose, Bld 207 (H) 70 - 99 mg/dL   BUN 28 (H) 8 - 23 mg/dL   Creatinine, Ser 1.61 (H) 0.61 - 1.24 mg/dL   Calcium 8.7 (L) 8.9 - 10.3 mg/dL   GFR, Estimated 34 (L) >60 mL/min   Anion gap 12 5 - 15  CBC   Collection Time: 06/30/23  2:29 PM  Result Value Ref Range   WBC 10.6 (H) 4.0 - 10.5 K/uL   RBC 4.41 4.22 - 5.81 MIL/uL   Hemoglobin 10.8 (L) 13.0 - 17.0 g/dL   HCT 09.6 (L) 04.5 - 40.9 %   MCV 78.0 (L) 80.0 - 100.0 fL   MCH 24.5 (L) 26.0 - 34.0 pg   MCHC 31.4 30.0 - 36.0 g/dL   RDW 81.1 91.4 - 78.2 %   Platelets 198 150 - 400 K/uL   nRBC 0.0 0.0 - 0.2 %  Troponin I (High Sensitivity)   Collection Time: 06/30/23  2:29 PM  Result Value Ref Range   Troponin I (High Sensitivity) 7 <18 ng/L  I-Stat CG4 Lactic Acid   Collection Time: 06/30/23  2:46 PM  Result Value Ref Range   Lactic Acid, Venous 1.6 0.5 - 1.9 mmol/L   DG Chest 2 View Result  Date: 06/30/2023 CLINICAL DATA:  Chest pain. EXAM: CHEST - 2 VIEW COMPARISON:  CT chest dated 05/26/2021. FINDINGS: The heart size and mediastinal contours are within  normal limits. Focal opacification of the superior aspect of the right lower lobe. The left lung is clear. No pleural effusion or pneumothorax. No acute osseous abnormality. IMPRESSION: Consolidative opacity in the superior aspect of the right lower lobe is concerning for pneumonia. Followup PA and lateral chest X-ray is recommended in 3-4 weeks following trial of antibiotic therapy to ensure resolution. Electronically Signed   By: Mannie Seek M.D.   On: 06/30/2023 16:02     EKG EKG Interpretation Date/Time:  Monday June 30 2023 14:36:38 EDT Ventricular Rate:  112 PR Interval:  118 QRS Duration:  84 QT Interval:  350 QTC Calculation: 477 R Axis:   73  Text Interpretation: Sinus tachycardia Confirmed by Guadalupe Lee ([id number]) on 06/30/2023 4:28:53 PM  Radiology DG Chest 2 View Result Date: 06/30/2023 CLINICAL DATA:  Chest pain. EXAM: CHEST - 2 VIEW COMPARISON:  CT chest dated 05/26/2021. FINDINGS: The heart size and mediastinal contours are within normal limits. Focal opacification of the superior aspect of the right lower lobe. The left lung is clear. No pleural effusion or pneumothorax. No acute osseous abnormality. IMPRESSION: Consolidative opacity in the superior aspect of the right lower lobe is concerning for pneumonia. Followup PA and lateral chest X-ray is recommended in 3-4 weeks following trial of antibiotic therapy to ensure resolution. Electronically Signed   By: Mannie Seek M.D.   On: 06/30/2023 16:02    Procedures Procedures    Medications Ordered in ED Medications  lactated ringers  bolus 1,000 mL (has no administration in time range)  ondansetron  (ZOFRAN ) injection 4 mg (has no administration in time range)  cefTRIAXone  (ROCEPHIN ) 1 g in sodium chloride  0.9 % 100 mL IVPB (has no administration in time  range)  azithromycin  (ZITHROMAX ) 500 mg in sodium chloride  0.9 % 250 mL IVPB (has no administration in time range)    ED Course/ Medical Decision Making/ A&P                                 Medical Decision Making Problems Addressed: Community acquired pneumonia of right lower lobe of lung: acute illness or injury with systemic symptoms that poses a threat to life or bodily functions Dehydration: acute illness or injury with systemic symptoms that poses a threat to life or bodily functions Nausea and vomiting in adult: acute illness or injury with systemic symptoms  Amount and/or Complexity of Data Reviewed External Data Reviewed: notes. Labs: ordered. Decision-making details documented in ED Course. Radiology: ordered and independent interpretation performed. Decision-making details documented in ED Course. ECG/medicine tests: ordered and independent interpretation performed. Decision-making details documented in ED Course. Discussion of management or test interpretation with external provider(s): medicine  Risk Prescription drug management. Decision regarding hospitalization.   Iv ns. Continuous pulse ox and cardiac monitoring. Labs ordered/sent. Imaging ordered.   Differential diagnosis includes pna, dehydration, etc. Dispo decision including potential need for admission considered - will get labs and imaging and reassess.   Reviewed nursing notes and prior charts for additional history. External reports reviewed.   Cardiac monitor: sinus rhythm, rate 105.   Labs reviewed/interpreted by me - wbc 10, hgb 10. Mildly increased bun/cr from baseline, aki, ivf bolus rocephin  and zithromax  iv.   Xrays reviewed/interpreted by me -  right lower infiltrate.    Medicine service consulted for admission - further eval/tx per inpatient medical team and subsequent f/u arranged by them.  Final Clinical Impression(s) / ED Diagnoses Final diagnoses:  None    Rx /  DC Orders ED Discharge Orders     None         Guadalupe Lee, MD 07/14/23 1012

## 2023-06-30 NOTE — ED Triage Notes (Signed)
 Pt states that he's had a productive cough and emesis ongoing for 40 days. Seen outpatient and diagnosed with PNA on Thursday. Unable to tolerate abx d/t emesis. Pt also endorses CP and SOB.

## 2023-06-30 NOTE — ED Notes (Signed)
 Delay in establishing IV access. Poor venous access.

## 2023-07-01 DIAGNOSIS — R634 Abnormal weight loss: Secondary | ICD-10-CM

## 2023-07-01 DIAGNOSIS — U071 COVID-19: Secondary | ICD-10-CM | POA: Diagnosis not present

## 2023-07-01 DIAGNOSIS — E871 Hypo-osmolality and hyponatremia: Secondary | ICD-10-CM | POA: Diagnosis present

## 2023-07-01 DIAGNOSIS — E118 Type 2 diabetes mellitus with unspecified complications: Secondary | ICD-10-CM | POA: Diagnosis not present

## 2023-07-01 DIAGNOSIS — E860 Dehydration: Secondary | ICD-10-CM | POA: Diagnosis not present

## 2023-07-01 DIAGNOSIS — J189 Pneumonia, unspecified organism: Secondary | ICD-10-CM

## 2023-07-01 DIAGNOSIS — R1310 Dysphagia, unspecified: Secondary | ICD-10-CM | POA: Diagnosis not present

## 2023-07-01 DIAGNOSIS — R112 Nausea with vomiting, unspecified: Secondary | ICD-10-CM | POA: Diagnosis not present

## 2023-07-01 DIAGNOSIS — N189 Chronic kidney disease, unspecified: Secondary | ICD-10-CM | POA: Diagnosis present

## 2023-07-01 DIAGNOSIS — N179 Acute kidney failure, unspecified: Secondary | ICD-10-CM | POA: Diagnosis present

## 2023-07-01 LAB — EXPECTORATED SPUTUM ASSESSMENT W GRAM STAIN, RFLX TO RESP C: Special Requests: NORMAL

## 2023-07-01 LAB — PROCALCITONIN: Procalcitonin: <0.1 ng/mL

## 2023-07-01 LAB — RESP PANEL BY RT-PCR (RSV, FLU A&B, COVID)  RVPGX2
Influenza A by PCR: NEGATIVE
Influenza B by PCR: NEGATIVE
Resp Syncytial Virus by PCR: NEGATIVE
SARS Coronavirus 2 by RT PCR: POSITIVE — AB

## 2023-07-01 LAB — STREP PNEUMONIAE URINARY ANTIGEN: Strep Pneumo Urinary Antigen: NEGATIVE

## 2023-07-01 LAB — BASIC METABOLIC PANEL WITH GFR
Anion gap: 10 (ref 5–15)
BUN: 22 mg/dL (ref 8–23)
CO2: 26 mmol/L (ref 22–32)
Calcium: 8.5 mg/dL — ABNORMAL LOW (ref 8.9–10.3)
Chloride: 95 mmol/L — ABNORMAL LOW (ref 98–111)
Creatinine, Ser: 1.68 mg/dL — ABNORMAL HIGH (ref 0.61–1.24)
GFR, Estimated: 44 mL/min — ABNORMAL LOW (ref >60)
Glucose, Bld: 139 mg/dL — ABNORMAL HIGH (ref 70–99)
Potassium: 4.3 mmol/L (ref 3.5–5.1)
Sodium: 131 mmol/L — ABNORMAL LOW (ref 135–145)

## 2023-07-01 LAB — HEMOGLOBIN A1C
Hgb A1c MFr Bld: 6.6 % — ABNORMAL HIGH (ref 4.8–5.6)
Mean Plasma Glucose: 142.72 mg/dL

## 2023-07-01 LAB — GLUCOSE, CAPILLARY
Glucose-Capillary: 103 mg/dL — ABNORMAL HIGH (ref 70–99)
Glucose-Capillary: 164 mg/dL — ABNORMAL HIGH (ref 70–99)
Glucose-Capillary: 154 mg/dL — ABNORMAL HIGH (ref 70–99)

## 2023-07-01 MED ORDER — SODIUM CHLORIDE 0.9 % IV SOLN
INTRAVENOUS | Status: AC
  Administered 2023-07-01: 1000 mL via INTRAVENOUS

## 2023-07-01 MED ORDER — INSULIN ASPART 100 UNIT/ML IJ SOLN: 0.0000 [IU] | Freq: Every day | INTRAMUSCULAR | Status: DC

## 2023-07-01 MED ORDER — MORPHINE SULFATE (PF) 2 MG/ML IV SOLN: 1.0000 mg | INTRAVENOUS | Status: DC | PRN

## 2023-07-01 MED ORDER — LINEZOLID 600 MG PO TABS
600.0000 mg | ORAL_TABLET | Freq: Two times a day (BID) | ORAL | Status: DC
  Administered 2023-07-01 – 2023-07-02 (×3): 600 mg via ORAL
  Filled 2023-07-01 (×4): qty 1

## 2023-07-01 MED ORDER — INSULIN ASPART 100 UNIT/ML IJ SOLN
0.0000 [IU] | Freq: Three times a day (TID) | INTRAMUSCULAR | Status: DC
  Administered 2023-07-01: 2 [IU] via SUBCUTANEOUS
  Administered 2023-07-02 (×2): 1 [IU] via SUBCUTANEOUS
  Administered 2023-07-02 – 2023-07-03 (×2): 2 [IU] via SUBCUTANEOUS

## 2023-07-01 NOTE — Plan of Care (Signed)
  Problem: Education: Goal: Knowledge of General Education information will improve Description: Including pain rating scale, medication(s)/side effects and non-pharmacologic comfort measures Outcome: Progressing   Problem: Health Behavior/Discharge Planning: Goal: Ability to manage health-related needs will improve Outcome: Progressing   Problem: Clinical Measurements: Goal: Ability to maintain clinical measurements within normal limits will improve Outcome: Progressing Goal: Will remain free from infection Outcome: Progressing Goal: Diagnostic test results will improve Outcome: Progressing Goal: Respiratory complications will improve Outcome: Progressing Goal: Cardiovascular complication will be avoided Outcome: Progressing   Problem: Activity: Goal: Risk for activity intolerance will decrease Outcome: Progressing   Problem: Nutrition: Goal: Adequate nutrition will be maintained Outcome: Progressing   Problem: Coping: Goal: Level of anxiety will decrease Outcome: Progressing   Problem: Elimination: Goal: Will not experience complications related to bowel motility Outcome: Progressing Goal: Will not experience complications related to urinary retention Outcome: Progressing   Problem: Pain Managment: Goal: General experience of comfort will improve and/or be controlled Outcome: Progressing   Problem: Safety: Goal: Ability to remain free from injury will improve Outcome: Progressing   Problem: Skin Integrity: Goal: Risk for impaired skin integrity will decrease Outcome: Progressing   Problem: Activity: Goal: Ability to tolerate increased activity will improve Outcome: Progressing   Problem: Clinical Measurements: Goal: Ability to maintain a body temperature in the normal range will improve Outcome: Progressing   Problem: Respiratory: Goal: Ability to maintain adequate ventilation will improve Outcome: Progressing Goal: Ability to maintain a clear airway  will improve Outcome: Progressing   Problem: Education: Goal: Knowledge of risk factors and measures for prevention of condition will improve Outcome: Progressing   Problem: Coping: Goal: Psychosocial and spiritual needs will be supported Outcome: Progressing   Problem: Respiratory: Goal: Will maintain a patent airway Outcome: Progressing Goal: Complications related to the disease process, condition or treatment will be avoided or minimized Outcome: Progressing

## 2023-07-01 NOTE — Progress Notes (Signed)
 [facility]                                                                              Vincent [name], is a 68 y.o. male, DOB - [birth year], [id number] Admit date - 06/30/2023    Outpatient Primary MD for the patient is [name], MD  LOS - 1  days  Chief Complaint  Patient presents with   Cough       Brief summary   Patient is a 68 year old male with CKD stage IIIb, diabetes mellitus type 2, prior history of cavitary pneumonia, chronic back pain, presented with vomiting, severe productive cough, shortness of breath, drenching night sweats for the last 3 weeks.  Patient also reported pleuritic chest pain on the right side.  He was placed on oral antibiotics but was unable to tolerate. In ED, temp 98.6 F, RR 20, pulse 89, BP 114/62 WBCs 10.6, hemoglobin 10.8, MCV 78.0, platelets 198.  Sodium 129, potassium 3.9, BUN 28, creatinine 2.1.  Baseline creatinine 1.6-1.9 Chest x-ray showed consolidative opacity in the superior aspect of the right lower lobe concerning for pneumonia. CT chest abdomen pelvis showed partial right upper lobe consolidation, confluent with more rounded masslike consolidation in superior right lower lobe, demonstrates internal low-density areas and gas bubbles, suspicious for necrotic pneumonia and developing cavitation or possible abscess.  Additional areas of suspected inflammation and infection in the left upper lobe.  Assessment & Plan    Principal Problem: Right-sided cavitary pneumonia, left upper lobe pneumonia, multifocal -CT chest abdomen pelvis reviewed, currently on IV Zithromax, Rocephin, allergic to penicillins. - May need bronchoscopy, pulmonology consulted   Active Problems: COVID+ -respiratory virus panel positive for COVID - Currently no hypoxia, ongoing symptoms for last 3 weeks - Will follow pulmonology recommendations regarding antivirals.   Acute kidney injury superimposed on chronic kidney disease stage IIIb  (HCC) - Baseline creatinine 1.6-1.9.  Received 1 L IV fluids in ED - Presented with creatinine of 2.1, placed on gentle hydration - Recheck BMP  Hyponatremia, acute - Likely due to dehydration, nausea and vomiting, - continue IV fluids    Chronic narcotic use, chronic back pain -Continue pain control    Diabetes mellitus type II uncontrolled with hyperglycemia with complication (HCC), CKD -Not taking metformin outpatient - Hemoglobin A1c 6.6 - Placed on sliding scale insulin, sensitive while inpatient  Estimated body mass index is 22.96 kg/m as calculated from the following:   Height as of 03/05/23: 5\' 10"  (1.778 m).   Weight as of 03/05/23: 72.6 kg.  Code Status: Full CODE STATUS DVT Prophylaxis:  enoxaparin (LOVENOX) injection 30 mg Start: 06/30/23 2100   Level of Care: Level of care: Telemetry Medical Family Communication: Updated patient Disposition Plan:      Remains inpatient appropriate:      Procedures:    Consultants:   Pulmonology  Antimicrobials:   Anti-infectives (From admission, onward)    Start     Dose/Rate Route Frequency Ordered Stop   07/01/23 1700  azithromycin (ZITHROMAX) 500 mg in sodium chloride 0.9 % 250 mL IVPB        500 mg 250 mL/hr over  60 Minutes Intravenous Every 24 hours 06/30/23 1842 07/06/23 1659   07/01/23 1000  cefTRIAXone (ROCEPHIN) 2 g in sodium chloride 0.9 % 100 mL IVPB        2 g 200 mL/hr over 30 Minutes Intravenous Every 24 hours 06/30/23 1845 07/06/23 0959   07/01/23 0000  cefTRIAXone (ROCEPHIN) 2 g in sodium chloride 0.9 % 100 mL IVPB  Status:  Discontinued        2 g 200 mL/hr over 30 Minutes Intravenous Every 24 hours 06/30/23 1842 06/30/23 1845   06/30/23 1700  cefTRIAXone (ROCEPHIN) 1 g in sodium chloride 0.9 % 100 mL IVPB        1 g 200 mL/hr over 30 Minutes Intravenous  Once 06/30/23 1645 06/30/23 1903   06/30/23 1700  azithromycin (ZITHROMAX) 500 mg in sodium chloride 0.9 % 250 mL IVPB        500 mg 250 mL/hr  over 60 Minutes Intravenous  Once 06/30/23 1645 06/30/23 1923          Medications  enoxaparin (LOVENOX) injection  30 mg Subcutaneous Q24H   ibuprofen  400 mg Oral TID   lactobacillus  1 g Oral TID WC      Subjective:   Vincent [name] was seen and examined today.  Having chronic back pain, pleuritic right-sided chest pain, no ongoing vomiting, fevers or chills.  Patient denies dizziness,  new weakness, numbess, tingling. No acute events overnight.    Objective:   Vitals:   06/30/23 2103 07/01/23 0024 07/01/23 0508 07/01/23 0832  BP: 114/62 (!) 101/57 (!) 103/57 121/63  Pulse: 89 74 89 (!) 58  Resp: 20 20 20 16   Temp: 99.3 F (37.4 C) 98.3 F (36.8 C) 98.7 F (37.1 C) 98.2 F (36.8 C)  TempSrc:      SpO2: 100% 100% 96% 97%   No intake or output data in the 24 hours ending 07/01/23 0907   Wt Readings from Last 3 Encounters:  03/05/23 72.6 kg  02/28/23 75.3 kg  05/20/22 76.7 kg     Exam General: Alert and oriented x 3, NAD Cardiovascular: S1 S2 auscultated,  RRR Respiratory: Diminished breath sounds at the bases, R>L Gastrointestinal: Soft, nontender, nondistended, + bowel sounds Ext: no pedal edema bilaterally Neuro: Strength 5/5 upper and lower extremities bilaterally Psych: Normal affect     Data Reviewed:  I have personally reviewed following labs    CBC Lab Results  Component Value Date   WBC 10.6 (H) 06/30/2023   RBC 4.41 06/30/2023   HGB 10.8 (L) 06/30/2023   HCT 34.4 (L) 06/30/2023   MCV 78.0 (L) 06/30/2023   MCH 24.5 (L) 06/30/2023   PLT 198 06/30/2023   MCHC 31.4 06/30/2023   RDW 15.4 06/30/2023   LYMPHSABS 1.2 03/13/2021   MONOABS 0.4 03/13/2021   EOSABS 0.1 03/13/2021   BASOSABS 0.0 03/13/2021     Last metabolic panel Lab Results  Component Value Date   NA 129 (L) 06/30/2023   K 3.9 06/30/2023   CL 92 (L) 06/30/2023   CO2 25 06/30/2023   BUN 28 (H) 06/30/2023   CREATININE 2.10 (H) 06/30/2023   GLUCOSE 207 (H) 06/30/2023    GFRNONAA 34 (L) 06/30/2023   GFRAA >60 10/26/2019   CALCIUM 8.7 (L) 06/30/2023   PROT 6.4 (L) 01/01/2022   ALBUMIN 3.6 01/01/2022   BILITOT 0.6 01/01/2022   ALKPHOS 93 01/01/2022   AST 17 01/01/2022   ALT 14 01/01/2022   ANIONGAP 12 06/30/2023  CBG (last 3)  No results for input(s): "GLUCAP" in the last 72 hours.    Coagulation Profile: No results for input(s): "INR", "PROTIME" in the last 168 hours.   Radiology Studies: I have personally reviewed the imaging studies  CT CHEST ABDOMEN PELVIS WO CONTRAST Result Date: 06/30/2023 CLINICAL DATA:  Sepsis emesis EXAM: CT CHEST, ABDOMEN AND PELVIS WITHOUT CONTRAST TECHNIQUE: Multidetector CT imaging of the chest, abdomen and pelvis was performed following the standard protocol without IV contrast. RADIATION DOSE REDUCTION: This exam was performed according to the departmental dose-optimization program which includes automated exposure control, adjustment of the mA and/or kV according to patient size and/or use of iterative reconstruction technique. COMPARISON:  Chest x-ray 06/30/2023, chest CT 05/25/2021 FINDINGS: CT CHEST FINDINGS Cardiovascular: Limited evaluation without intravenous contrast. Mild aortic atherosclerosis. No aneurysm. Coronary vascular calcification. Normal cardiac size. No pericardial effusion Mediastinum/Nodes: Patent trachea. No thyroid mass. Mildly prominent precarinal lymph node measuring up to 12 mm. Subcarinal lymph node measures 11 mm. Esophagus within normal limits Lungs/Pleura: Emphysema. Bandlike density in the right lower lobe with mild bronchiectasis consistent with scarring. Partial right upper lobe consolidation, confluent with more rounded masslike consolidation in the superior right lower lobe. This demonstrates internal low-density areas and gas bubbles. Peribronchovascular ground-glass nodularity inferior to the consolidation. Small foci of ground-glass density in the left upper lobe is well. Musculoskeletal:  Sternum appears intact. Scoliosis of the spine. No acute osseous abnormality CT ABDOMEN PELVIS FINDINGS Hepatobiliary: Gallstone.  No biliary dilatation Pancreas: Unremarkable. No pancreatic ductal dilatation or surrounding inflammatory changes. Spleen: Enlarged, measures 16 cm craniocaudal. Adrenals/Urinary Tract: Adrenal glands are normal. Kidneys show no hydronephrosis. The bladder is normal Stomach/Bowel: Stomach nonenlarged. No dilated small bowel. No acute bowel wall thickening. Negative appendix. Vascular/Lymphatic: Aortic atherosclerosis. No enlarged abdominal or pelvic lymph nodes. Reproductive: Enlarged prostate Other: Negative for pelvic effusion or free air Musculoskeletal: Partially visualized anterior fusion hardware in the cervical spine. Posterior spinal fusion hardware at L3-L4 with interbody device. Fixating screws at L3 appear to breach the superior endplate of L3. Prominent lucency at the left fixating screw of L4. Right-sided fixating screw at L4 along the lateral cortical margin of L4 vertebral body. Apparent 2 interbody devices at L3-L4, with the more left-sided device tilted anterior and inferior. IMPRESSION: 1. Partial right upper lobe consolidation, confluent with more rounded masslike consolidation in the superior right lower lobe. This demonstrates internal low-density areas and gas bubbles, suspicious for necrotic pneumonia and developing cavitation or possible abscess. Imaging follow-up to resolution advised to exclude underlying mass given the rounded masslike features of consolidation at the superior right lower lobe. Additional areas of suspected inflammation or infection in the left upper lobe. 2. Mildly prominent mediastinal lymph nodes, likely reactive. 3. Emphysema. 4. Gallstone. 5. Splenomegaly. Electronically Signed   By: Jasmine Pang M.D.   On: 06/30/2023 21:42   DG Chest 2 View Result Date: 06/30/2023 CLINICAL DATA:  Chest pain. EXAM: CHEST - 2 VIEW COMPARISON:  CT chest  dated 05/26/2021. FINDINGS: The heart size and mediastinal contours are within normal limits. Focal opacification of the superior aspect of the right lower lobe. The left lung is clear. No pleural effusion or pneumothorax. No acute osseous abnormality. IMPRESSION: Consolidative opacity in the superior aspect of the right lower lobe is concerning for pneumonia. Followup PA and lateral chest X-ray is recommended in 3-4 weeks following trial of antibiotic therapy to ensure resolution. Electronically Signed   By: Hart Robinsons M.D.   On: 06/30/2023  16:02       Leopold Smyers M.D. [facility] 07/01/2023, 9:07 AM  Available via Epic secure chat 7am-7pm After 7 pm, please refer to night coverage provider listed on amion.

## 2023-07-01 NOTE — Consult Note (Addendum)
 NAME:  Vincent [name], MRN:  [id number], DOB:  12/06/1955, LOS: 1 ADMISSION DATE:  06/30/2023, CONSULTATION DATE: 07/01/2023 REFERRING MD: Dr. Isidoro Donning CHIEF COMPLAINT: Cavitary pneumonia  History of Present Illness:   68 year old with history of diabetes, facial basal cell cancer, pneumonia, COVID-19 infection 2023 presents with recurrent cavitary pneumonia   He has a history of pneumonia in 2022, which was in the right lower lobe secondary to Streptococcus intermedius and resolved with treatment, leaving some scarring.  He had a bronchoscopy with BAL and lung biopsy at that time.  He denies current smoking but has a history of smoking 30 years ago. He denies exposure to tuberculosis, has never been homeless or incarcerated, and is a native of [place]. He reports bringing up green sputum.  He experienced significant weight loss following COVID-19, dropping from approximately 185-190 pounds to 160 pounds, and currently weighs 137 pounds. He describes himself as 'skinny bones.'  He denies choking on food or having difficulty swallowing, although he mentions that his throat feels smaller since having COVID-19 in 2022, making it difficult to swallow large pills.  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  Pertinent  Medical History    has a past medical history of Arthritis, CKD (chronic kidney disease), Complication of anesthesia, COVID (2022), Diabetes mellitus without complication (HCC), Facial basal cell cancer, Heart murmur, Nerve pain, and Pneumonia.   Significant Hospital Events: Including procedures, antibiotic start and stop dates in addition to other pertinent events     Interim History / Subjective:    Objective   Blood pressure 105/65, pulse 84, temperature 97.9 F (36.6 C), resp. rate 16, height 5' 10.5" (1.791 m), weight 62 kg, SpO2 100%.       No intake or output data in the 24 hours  ending 07/01/23 1454 Filed Weights   07/01/23 1231  Weight: 62 kg    Examination: Blood pressure 105/65, pulse 84, temperature 97.9 F (36.6 C), resp. rate 16, height 5' 10.5" (1.791 m), weight 62 kg, SpO2 100%. Gen:      No acute distress HEENT:  EOMI, sclera anicteric Neck:     No masses; no thyromegaly Lungs:    Clear to auscultation bilaterally; normal respiratory effort CV:         Regular rate and rhythm; no murmurs Abd:      + bowel sounds; soft, non-tender; no palpable masses, no distension Ext:    No edema; adequate peripheral perfusion Skin:      Warm and dry; no rash Neuro: alert and oriented x 3 Psych: normal mood and affect   Lab/imaging review Significant for sodium 131, WBC 10.6 Creatinine 1.68 CT chest reviewed with right upper lobe consolidation with cavitation, with additional consolidation in the right lower lobe.  No pleural effusion  Resolved Hospital Problem list     Assessment & Plan:  Recurrent pneumonia Recurrent pneumonia in the right upper lobe. Previous pneumonia in 2022 in the right lower lobe resolved with scarring. Current pneumonia may be due to aspiration, as he reports dysphagia since COVID-19 in 2022. Significant weight loss from 185-190 lbs to 137 lbs. No current smoking, but smoked 30 years ago. No known TB exposure or other risk factors for recurrent pneumonia. Green sputum suggests bacterial infection. No need for lung surgery; focus on antibiotics and assessing for aspiration.  - Order sputum cultures to identify the causative organism - Continue  antibiotic therapy.  Agree with Zyvox coverage as he is MRSA positive - Order a barium swallow study to assess for aspiration - Monitor response to antibiotics and adjust treatment as necessary  Positive COVID Noted to be COVID-positive on admission.  CT chest is not consistent with COVID-pneumonia and it may be an incidental finding Supportive care  Dysphagia Dysphagia since COVID-19 in 2022,  potentially contributing to recurrent pneumonia due to aspiration. Significant weight loss since onset. - Order a barium swallow study to evaluate swallowing function and check for aspiration  Weight loss Significant weight loss from 185-190 lbs to 137 lbs since COVID-19 in 2022, likely related to dysphagia and potential malnutrition. - Evaluate nutritional status and consider referral to a dietitian if necessary   Signature:   Chilton Greathouse MD [facility] See Amion for pager  If no response to pager , please call [phone] until 7pm After 7:00 pm call Elink  [phone] 07/01/2023, 3:02 PM

## 2023-07-01 NOTE — Plan of Care (Signed)
 Patient AAOx4. Telemetry monitoring in progress. Covid precautions maintained. Sputum sample sent, awaiting new urine sample. NS running at 165mL/hr via RPIV. Safety precautions maintained.     Problem: Education: Goal: Knowledge of General Education information will improve Description: Including pain rating scale, medication(s)/side effects and non-pharmacologic comfort measures Outcome: Progressing   Problem: Health Behavior/Discharge Planning: Goal: Ability to manage health-related needs will improve Outcome: Progressing   Problem: Clinical Measurements: Goal: Ability to maintain clinical measurements within normal limits will improve Outcome: Progressing   Problem: Activity: Goal: Risk for activity intolerance will decrease Outcome: Progressing   Problem: Nutrition: Goal: Adequate nutrition will be maintained Outcome: Progressing

## 2023-07-02 ENCOUNTER — Inpatient Hospital Stay (HOSPITAL_COMMUNITY)

## 2023-07-02 DIAGNOSIS — J189 Pneumonia, unspecified organism: Secondary | ICD-10-CM | POA: Diagnosis not present

## 2023-07-02 DIAGNOSIS — J850 Gangrene and necrosis of lung: Secondary | ICD-10-CM

## 2023-07-02 DIAGNOSIS — R634 Abnormal weight loss: Secondary | ICD-10-CM | POA: Diagnosis not present

## 2023-07-02 DIAGNOSIS — U071 COVID-19: Secondary | ICD-10-CM | POA: Diagnosis not present

## 2023-07-02 DIAGNOSIS — E860 Dehydration: Secondary | ICD-10-CM | POA: Diagnosis not present

## 2023-07-02 DIAGNOSIS — R112 Nausea with vomiting, unspecified: Secondary | ICD-10-CM | POA: Diagnosis not present

## 2023-07-02 DIAGNOSIS — R1310 Dysphagia, unspecified: Secondary | ICD-10-CM | POA: Diagnosis not present

## 2023-07-02 LAB — GLUCOSE, CAPILLARY
Glucose-Capillary: 132 mg/dL — ABNORMAL HIGH (ref 70–99)
Glucose-Capillary: 148 mg/dL — ABNORMAL HIGH (ref 70–99)
Glucose-Capillary: 163 mg/dL — ABNORMAL HIGH (ref 70–99)

## 2023-07-02 MED ORDER — LINEZOLID 600 MG/300ML IV SOLN
600.0000 mg | Freq: Two times a day (BID) | INTRAVENOUS | Status: DC
  Administered 2023-07-02 – 2023-07-05 (×6): 600 mg via INTRAVENOUS
  Filled 2023-07-02 (×7): qty 300

## 2023-07-02 MED ORDER — SODIUM CHLORIDE 0.9 % IV SOLN
3.0000 g | Freq: Four times a day (QID) | INTRAVENOUS | Status: DC
  Administered 2023-07-02 – 2023-07-05 (×12): 3 g via INTRAVENOUS
  Filled 2023-07-02 (×12): qty 8

## 2023-07-02 NOTE — Plan of Care (Signed)
  Problem: Education: Goal: Knowledge of General Education information will improve Description: Including pain rating scale, medication(s)/side effects and non-pharmacologic comfort measures Outcome: Progressing   Problem: Health Behavior/Discharge Planning: Goal: Ability to manage health-related needs will improve Outcome: Progressing   Problem: Clinical Measurements: Goal: Ability to maintain clinical measurements within normal limits will improve Outcome: Progressing Goal: Will remain free from infection Outcome: Progressing Goal: Diagnostic test results will improve Outcome: Progressing Goal: Respiratory complications will improve Outcome: Progressing Goal: Cardiovascular complication will be avoided Outcome: Progressing   Problem: Activity: Goal: Risk for activity intolerance will decrease Outcome: Progressing   Problem: Nutrition: Goal: Adequate nutrition will be maintained Outcome: Progressing   Problem: Coping: Goal: Level of anxiety will decrease Outcome: Progressing   Problem: Elimination: Goal: Will not experience complications related to bowel motility Outcome: Progressing Goal: Will not experience complications related to urinary retention Outcome: Progressing   Problem: Pain Managment: Goal: General experience of comfort will improve and/or be controlled Outcome: Progressing   Problem: Safety: Goal: Ability to remain free from injury will improve Outcome: Progressing   Problem: Skin Integrity: Goal: Risk for impaired skin integrity will decrease Outcome: Progressing   Problem: Activity: Goal: Ability to tolerate increased activity will improve Outcome: Progressing   Problem: Clinical Measurements: Goal: Ability to maintain a body temperature in the normal range will improve Outcome: Progressing   Problem: Respiratory: Goal: Ability to maintain adequate ventilation will improve Outcome: Progressing Goal: Ability to maintain a clear airway  will improve Outcome: Progressing   Problem: Education: Goal: Knowledge of risk factors and measures for prevention of condition will improve Outcome: Progressing   Problem: Coping: Goal: Psychosocial and spiritual needs will be supported Outcome: Progressing   Problem: Respiratory: Goal: Will maintain a patent airway Outcome: Progressing Goal: Complications related to the disease process, condition or treatment will be avoided or minimized Outcome: Progressing   Problem: Education: Goal: Ability to describe self-care measures that may prevent or decrease complications (Diabetes Survival Skills Education) will improve Outcome: Progressing Goal: Individualized Educational Video(s) Outcome: Progressing   Problem: Coping: Goal: Ability to adjust to condition or change in health will improve Outcome: Progressing   Problem: Fluid Volume: Goal: Ability to maintain a balanced intake and output will improve Outcome: Progressing   Problem: Health Behavior/Discharge Planning: Goal: Ability to identify and utilize available resources and services will improve Outcome: Progressing Goal: Ability to manage health-related needs will improve Outcome: Progressing   Problem: Metabolic: Goal: Ability to maintain appropriate glucose levels will improve Outcome: Progressing   Problem: Nutritional: Goal: Maintenance of adequate nutrition will improve Outcome: Progressing Goal: Progress toward achieving an optimal weight will improve Outcome: Progressing   Problem: Skin Integrity: Goal: Risk for impaired skin integrity will decrease Outcome: Progressing   Problem: Tissue Perfusion: Goal: Adequacy of tissue perfusion will improve Outcome: Progressing

## 2023-07-02 NOTE — Progress Notes (Signed)
 [facility]                                                                              Vincent [name], is a 68 y.o. male, DOB - 19-Nov-1955, ZOX:[id number] Admit date - 06/30/2023    Outpatient Primary MD for the patient is [name], MD  LOS - 2  days  Chief Complaint  Patient presents with   Cough       Brief summary   Patient is a 68 year old male with CKD stage IIIb, diabetes mellitus type 2, prior history of cavitary pneumonia, chronic back pain, presented with vomiting, severe productive cough, shortness of breath, drenching night sweats for the last 3 weeks.  Patient also reported pleuritic chest pain on the right side.  He was placed on oral antibiotics but was unable to tolerate. In ED, temp 98.6 F, RR 20, pulse 89, BP 114/62 WBCs 10.6, hemoglobin 10.8, MCV 78.0, platelets 198.  Sodium 129, potassium 3.9, BUN 28, creatinine 2.1.  Baseline creatinine 1.6-1.9 Chest x-ray showed consolidative opacity in the superior aspect of the right lower lobe concerning for pneumonia. CT chest abdomen pelvis showed partial right upper lobe consolidation, confluent with more rounded masslike consolidation in superior right lower lobe, demonstrates internal low-density areas and gas bubbles, suspicious for necrotic pneumonia and developing cavitation or possible abscess.  Additional areas of suspected inflammation and infection in the left upper lobe.  Assessment & Plan    Principal Problem: Right-sided cavitary pneumonia, left upper lobe pneumonia, multifocal -CT chest abdomen pelvis reviewed, - PCCM consulted, currently on IV Zithromax, Rocephin, Zyvox (MRSA positive) - SLP pending   Active Problems: COVID+ -respiratory virus panel positive for COVID - Currently no hypoxia, ongoing symptoms for last 3 weeks - Likely incidental finding, continue supportive care   Acute kidney injury superimposed on chronic kidney disease stage IIIb (HCC) - Baseline  creatinine 1.6-1.9.  Received 1 L IV fluids in ED - Presented with creatinine of 2.1, placed on gentle hydration - Creatinine improving, 1.6  Hyponatremia, acute - Likely due to dehydration, nausea and vomiting, sodium 129 on admission - continue IV fluids - Sodium improving    Chronic narcotic use, chronic back pain -Continue pain control    Diabetes mellitus type II uncontrolled with hyperglycemia with complication (HCC), CKD -Not taking metformin outpatient - Hemoglobin A1c 6.6 - Placed on sliding scale insulin, sensitive while inpatient CBG (last 3)  Recent Labs    07/01/23 1636 07/01/23 2035 07/02/23 0749  GLUCAP 164* 154* 132*     Estimated body mass index is 19.32 kg/m as calculated from the following:   Height as of this encounter: 5' 10.5" (1.791 m).   Weight as of this encounter: 62 kg.  Code Status: Full CODE STATUS DVT Prophylaxis:  enoxaparin (LOVENOX) injection 30 mg Start: 06/30/23 2100   Level of Care: Level of care: Telemetry Medical Family Communication: Updated patient Disposition Plan:      Remains inpatient appropriate:      Procedures:    Consultants:   Pulmonology  Antimicrobials:   Anti-infectives (From admission, onward)    Start     Dose/Rate  Route Frequency Ordered Stop   07/01/23 1700  azithromycin (ZITHROMAX) 500 mg in sodium chloride 0.9 % 250 mL IVPB        500 mg 250 mL/hr over 60 Minutes Intravenous Every 24 hours 06/30/23 1842 07/06/23 1659   07/01/23 1215  linezolid (ZYVOX) tablet 600 mg        600 mg Oral Every 12 hours 07/01/23 1121     07/01/23 1000  cefTRIAXone (ROCEPHIN) 2 g in sodium chloride 0.9 % 100 mL IVPB        2 g 200 mL/hr over 30 Minutes Intravenous Every 24 hours 06/30/23 1845 07/06/23 0959   07/01/23 0000  cefTRIAXone (ROCEPHIN) 2 g in sodium chloride 0.9 % 100 mL IVPB  Status:  Discontinued        2 g 200 mL/hr over 30 Minutes Intravenous Every 24 hours 06/30/23 1842 06/30/23 1845   06/30/23 1700   cefTRIAXone (ROCEPHIN) 1 g in sodium chloride 0.9 % 100 mL IVPB        1 g 200 mL/hr over 30 Minutes Intravenous  Once 06/30/23 1645 06/30/23 1903   06/30/23 1700  azithromycin (ZITHROMAX) 500 mg in sodium chloride 0.9 % 250 mL IVPB        500 mg 250 mL/hr over 60 Minutes Intravenous  Once 06/30/23 1645 06/30/23 1923          Medications  enoxaparin (LOVENOX) injection  30 mg Subcutaneous Q24H   insulin aspart  0-5 Units Subcutaneous QHS   insulin aspart  0-9 Units Subcutaneous TID WC   lactobacillus  1 g Oral TID WC   linezolid  600 mg Oral Q12H      Subjective:   Vincent [name] was seen and examined today.  States having a lot of cough and phlegm but feeling somewhat better today.  No acute nausea or vomiting.  No fevers, dizziness, chest pain  Vitals:   07/01/23 1634 07/01/23 2034 07/02/23 0501 07/02/23 0848  BP:  (!) 100/52 (!) 116/57 109/61  Pulse: 81 84 85 93  Resp:  18 18 18   Temp:  98.7 F (37.1 C) 98.2 F (36.8 C) 98.6 F (37 C)  TempSrc:  Oral Oral   SpO2: 100% 99% 97% 98%  Weight:      Height:        Intake/Output Summary (Last 24 hours) at 07/02/2023 1016 Last data filed at 07/02/2023 0310 Gross per 24 hour  Intake 1391.37 ml  Output --  Net 1391.37 ml     Wt Readings from Last 3 Encounters:  07/01/23 62 kg  03/05/23 72.6 kg  02/28/23 75.3 kg   Physical Exam General: Alert and oriented x 3, NAD Cardiovascular: S1 S2 clear, RRR.  Respiratory: CTAB, no wheezing Gastrointestinal: Soft, nontender, nondistended, NBS Ext: no pedal edema bilaterally Neuro: no new deficits Psych: Normal affect   Data Reviewed:  I have personally reviewed following labs    CBC Lab Results  Component Value Date   WBC 10.6 (H) 06/30/2023   RBC 4.41 06/30/2023   HGB 10.8 (L) 06/30/2023   HCT 34.4 (L) 06/30/2023   MCV 78.0 (L) 06/30/2023   MCH 24.5 (L) 06/30/2023   PLT 198 06/30/2023   MCHC 31.4 06/30/2023   RDW 15.4 06/30/2023   LYMPHSABS 1.2 03/13/2021    MONOABS 0.4 03/13/2021   EOSABS 0.1 03/13/2021   BASOSABS 0.0 03/13/2021     Last metabolic panel Lab Results  Component Value Date   NA 131 (L) 07/01/2023  K 4.3 07/01/2023   CL 95 (L) 07/01/2023   CO2 26 07/01/2023   BUN 22 07/01/2023   CREATININE 1.68 (H) 07/01/2023   GLUCOSE 139 (H) 07/01/2023   GFRNONAA 44 (L) 07/01/2023   GFRAA >60 10/26/2019   CALCIUM 8.5 (L) 07/01/2023   PROT 6.4 (L) 01/01/2022   ALBUMIN 3.6 01/01/2022   BILITOT 0.6 01/01/2022   ALKPHOS 93 01/01/2022   AST 17 01/01/2022   ALT 14 01/01/2022   ANIONGAP 10 07/01/2023    CBG (last 3)  Recent Labs    07/01/23 1636 07/01/23 2035 07/02/23 0749  GLUCAP 164* 154* 132*      Coagulation Profile: No results for input(s): "INR", "PROTIME" in the last 168 hours.   Radiology Studies: I have personally reviewed the imaging studies  CT CHEST ABDOMEN PELVIS WO CONTRAST Result Date: 06/30/2023 CLINICAL DATA:  Sepsis emesis EXAM: CT CHEST, ABDOMEN AND PELVIS WITHOUT CONTRAST TECHNIQUE: Multidetector CT imaging of the chest, abdomen and pelvis was performed following the standard protocol without IV contrast. RADIATION DOSE REDUCTION: This exam was performed according to the departmental dose-optimization program which includes automated exposure control, adjustment of the mA and/or kV according to patient size and/or use of iterative reconstruction technique. COMPARISON:  Chest x-ray 06/30/2023, chest CT 05/25/2021 FINDINGS: CT CHEST FINDINGS Cardiovascular: Limited evaluation without intravenous contrast. Mild aortic atherosclerosis. No aneurysm. Coronary vascular calcification. Normal cardiac size. No pericardial effusion Mediastinum/Nodes: Patent trachea. No thyroid mass. Mildly prominent precarinal lymph node measuring up to 12 mm. Subcarinal lymph node measures 11 mm. Esophagus within normal limits Lungs/Pleura: Emphysema. Bandlike density in the right lower lobe with mild bronchiectasis consistent with scarring.  Partial right upper lobe consolidation, confluent with more rounded masslike consolidation in the superior right lower lobe. This demonstrates internal low-density areas and gas bubbles. Peribronchovascular ground-glass nodularity inferior to the consolidation. Small foci of ground-glass density in the left upper lobe is well. Musculoskeletal: Sternum appears intact. Scoliosis of the spine. No acute osseous abnormality CT ABDOMEN PELVIS FINDINGS Hepatobiliary: Gallstone.  No biliary dilatation Pancreas: Unremarkable. No pancreatic ductal dilatation or surrounding inflammatory changes. Spleen: Enlarged, measures 16 cm craniocaudal. Adrenals/Urinary Tract: Adrenal glands are normal. Kidneys show no hydronephrosis. The bladder is normal Stomach/Bowel: Stomach nonenlarged. No dilated small bowel. No acute bowel wall thickening. Negative appendix. Vascular/Lymphatic: Aortic atherosclerosis. No enlarged abdominal or pelvic lymph nodes. Reproductive: Enlarged prostate Other: Negative for pelvic effusion or free air Musculoskeletal: Partially visualized anterior fusion hardware in the cervical spine. Posterior spinal fusion hardware at L3-L4 with interbody device. Fixating screws at L3 appear to breach the superior endplate of L3. Prominent lucency at the left fixating screw of L4. Right-sided fixating screw at L4 along the lateral cortical margin of L4 vertebral body. Apparent 2 interbody devices at L3-L4, with the more left-sided device tilted anterior and inferior. IMPRESSION: 1. Partial right upper lobe consolidation, confluent with more rounded masslike consolidation in the superior right lower lobe. This demonstrates internal low-density areas and gas bubbles, suspicious for necrotic pneumonia and developing cavitation or possible abscess. Imaging follow-up to resolution advised to exclude underlying mass given the rounded masslike features of consolidation at the superior right lower lobe. Additional areas of  suspected inflammation or infection in the left upper lobe. 2. Mildly prominent mediastinal lymph nodes, likely reactive. 3. Emphysema. 4. Gallstone. 5. Splenomegaly. Electronically Signed   By: Jasmine Pang M.D.   On: 06/30/2023 21:42   DG Chest 2 View Result Date: 06/30/2023 CLINICAL DATA:  Chest pain.  EXAM: CHEST - 2 VIEW COMPARISON:  CT chest dated 05/26/2021. FINDINGS: The heart size and mediastinal contours are within normal limits. Focal opacification of the superior aspect of the right lower lobe. The left lung is clear. No pleural effusion or pneumothorax. No acute osseous abnormality. IMPRESSION: Consolidative opacity in the superior aspect of the right lower lobe is concerning for pneumonia. Followup PA and lateral chest X-ray is recommended in 3-4 weeks following trial of antibiotic therapy to ensure resolution. Electronically Signed   By: Hart Robinsons M.D.   On: 06/30/2023 16:02       Nuri Larmer M.D. [facility] 07/02/2023, 10:16 AM  Available via Epic secure chat 7am-7pm After 7 pm, please refer to night coverage provider listed on amion.

## 2023-07-02 NOTE — Procedures (Signed)
 Modified Barium Swallow Study  Patient Details  Name: Vincent [name] MRN: [id number] Date of Birth: 21-Apr-1955  Today's Date: 07/02/2023  Modified Barium Swallow completed.  Full report located under Chart Review in the Imaging Section.  History of Present Illness Patient is a 68 y.o. male with PMH: DM, facial basal cell cancer, h/o tobacco use, PNA, COVID-19 infection 2022 who presented to the hospital on 06/30/23 with recurrent cavitary PNA. He has lost significant amount of weight since having COVID-19 in 2022, reporting he dropped from 185 to 160 and currently weighing 137lbs. He feels that his throat is smaller since having Covid and this makes it difficult to swallow large pills. MBS ordered to r/o aspiration as contributing to recurrent PNA.   Clinical Impression Patient presents with an oropharyngeal swallow that appears [facility] and is without aspiration of any tested barium consistency. He did exhibit an instance of flash penetration of thin liquids which remained well above vocal cords and fully exitied laryngeal vestibule. Only trace amount of vallecular sinus residuals observed s/p initial swallows with liquids. Prominent cricopharyngeal bar observed and this, in combination with ACDF hardware, resulted in mild narrowing of esophagus just below level of PES but no significant barium residuals observed. During esophageal sweep, SLP did observe appearance of suspected barium stasis in distal esophagus but no retrograde movement of barium. SLP is not recommmending further skilled services, however a barium esophagram may be beneficial to fully assess patient's esophageal function. (See MBS image slide RF #13). Factors that may increase risk of adverse event in presence of aspiration [employer] 2021): Poor general health and/or compromised immunity  Swallow Evaluation Recommendations Recommendations: PO diet PO Diet Recommendation: Regular;Thin liquids (Level 0) Liquid Administration via:  Cup;Straw Medication Administration: Other (Comment) (as tolerated) Supervision: Patient able to self-feed Swallowing strategies  : Slow rate;Small bites/sips Postural changes: Position pt fully upright for meals;Stay upright 30-60 min after meals Oral care recommendations: Oral care BID (2x/day)     Angela Nevin, MA, CCC-SLP Speech Therapy

## 2023-07-02 NOTE — Plan of Care (Signed)
 Patient AAOx4. Covid precautions in place. LBM today. PRN oxycodone given with + effect, patient unable to give number for effectiveness. When hanging Azithromycin, patient stated IV was burning. Medication paused. Attempted twice for new IV, unsuccessful. IV consult placed, still awaiting at end of shift. Night shift RN made aware that abx needs to be started once new IV placed. Safety precautions maintained.     Problem: Health Behavior/Discharge Planning: Goal: Ability to manage health-related needs will improve Outcome: Progressing   Problem: Clinical Measurements: Goal: Respiratory complications will improve Outcome: Progressing   Problem: Nutrition: Goal: Adequate nutrition will be maintained Outcome: Progressing   Problem: Elimination: Goal: Will not experience complications related to bowel motility Outcome: Progressing   Problem: Pain Managment: Goal: General experience of comfort will improve and/or be controlled Outcome: Progressing

## 2023-07-02 NOTE — Progress Notes (Signed)
 NAME:  Vincent [name], MRN:  [id number], DOB:  Feb 16, 1956, LOS: 2 ADMISSION DATE:  06/30/2023, CONSULTATION DATE: 07/01/2023 REFERRING MD: Dr. Isidoro Donning CHIEF COMPLAINT: Cavitary pneumonia  History of Present Illness:   68 year old with history of diabetes, facial basal cell cancer, pneumonia, COVID-19 infection 2023 presents with recurrent cavitary pneumonia   He has a history of pneumonia in 2022, which was in the right lower lobe secondary to Streptococcus intermedius and resolved with treatment, leaving some scarring.  He had a bronchoscopy with BAL and lung biopsy at that time.  He denies current smoking but has a history of smoking 30 years ago. He denies exposure to tuberculosis, has never been homeless or incarcerated, and is a native of [place]. He reports bringing up green sputum.  He experienced significant weight loss following COVID-19, dropping from approximately 185-190 pounds to 160 pounds, and currently weighs 137 pounds. He describes himself as 'skinny bones.'  He denies choking on food or having difficulty swallowing, although he mentions that his throat feels smaller since having COVID-19 in 2022, making it difficult to swallow large pills.  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  Pertinent  Medical History    has a past medical history of Arthritis, CKD (chronic kidney disease), Complication of anesthesia, COVID (2022), Diabetes mellitus without complication (HCC), Facial basal cell cancer, Heart murmur, Nerve pain, and Pneumonia.   Significant Hospital Events: Including procedures, antibiotic start and stop dates in addition to other pertinent events   4/7 Admit  Interim History / Subjective:   Feels more fatigued. Cough has improved Sptum  color has changed from green to tan  Objective   Blood pressure 98/64, pulse 76, temperature 98.6 F (37 C), resp. rate 18, height 5' 10.5"  (1.791 m), weight 62 kg, SpO2 98%.        Intake/Output Summary (Last 24 hours) at 07/02/2023 1416 Last data filed at 07/02/2023 0310 Gross per 24 hour  Intake 1391.37 ml  Output --  Net 1391.37 ml   Filed Weights   07/01/23 1231  Weight: 62 kg    Examination: Gen:      No acute distress HEENT:  EOMI, sclera anicteric Neck:     No masses; no thyromegaly Lungs:    Clear to auscultation bilaterally; normal respiratory effort CV:         Regular rate and rhythm; no murmurs Abd:      + bowel sounds; soft, non-tender; no palpable masses, no distension Ext:    No edema; adequate peripheral perfusion Skin:      Warm and dry; no rash Neuro: alert and oriented x 3 Psych: normal mood and affect   Lab/imaging review CT chest reviewed with right upper lobe consolidation with cavitation, with additional consolidation in the right lower lobe.  No pleural effusion  Resolved Hospital Problem list     Assessment & Plan:  Recurrent pneumonia Recurrent pneumonia in the right upper lobe. Previous pneumonia in 2022 in the right lower lobe resolved with scarring. Current pneumonia may be due to aspiration, as he reports dysphagia since COVID-19 in 2022. Significant weight loss from 185-190 lbs to 137 lbs. No current smoking, but smoked 30 years ago. No known TB exposure or other risk factors for recurrent pneumonia. Green sputum suggests bacterial infection. No need for lung surgery; focus on antibiotics and assessing for aspiration.  - Follow sputum cultures to identify  the causative organism - Continue antibiotic therapy.  Agree with Zyvox coverage as he is MRSA positive - Barium swallow study is pending to assess for aspiration - Monitor response to antibiotics and adjust treatment as necessary  Positive COVID Noted to be COVID-positive on admission.  CT chest is not consistent with COVID-pneumonia and it may be an incidental finding Supportive care  Dysphagia Dysphagia since COVID-19 in  2022, potentially contributing to recurrent pneumonia due to aspiration. Significant weight loss since onset. - Ordered a barium swallow study to evaluate swallowing function and check for aspiration  Weight loss Significant weight loss from 185-190 lbs to 137 lbs since COVID-19 in 2022, likely related to dysphagia and potential malnutrition. - Evaluate nutritional status and consider referral to a dietitian if necessary   Signature:   Chilton Greathouse MD [facility] See Amion for pager  If no response to pager , please call [phone] until 7pm After 7:00 pm call Elink  [phone] 07/02/2023, 2:16 PM

## 2023-07-03 ENCOUNTER — Telehealth: Payer: Self-pay | Admitting: Physician Assistant

## 2023-07-03 DIAGNOSIS — R112 Nausea with vomiting, unspecified: Secondary | ICD-10-CM | POA: Diagnosis not present

## 2023-07-03 DIAGNOSIS — J850 Gangrene and necrosis of lung: Secondary | ICD-10-CM | POA: Diagnosis not present

## 2023-07-03 DIAGNOSIS — E860 Dehydration: Secondary | ICD-10-CM | POA: Diagnosis not present

## 2023-07-03 DIAGNOSIS — R634 Abnormal weight loss: Secondary | ICD-10-CM | POA: Diagnosis not present

## 2023-07-03 DIAGNOSIS — J189 Pneumonia, unspecified organism: Secondary | ICD-10-CM | POA: Diagnosis not present

## 2023-07-03 DIAGNOSIS — R1310 Dysphagia, unspecified: Secondary | ICD-10-CM | POA: Diagnosis not present

## 2023-07-03 DIAGNOSIS — U071 COVID-19: Secondary | ICD-10-CM | POA: Diagnosis not present

## 2023-07-03 DIAGNOSIS — J984 Other disorders of lung: Secondary | ICD-10-CM

## 2023-07-03 LAB — BASIC METABOLIC PANEL WITH GFR
Anion gap: 9 (ref 5–15)
BUN: 13 mg/dL (ref 8–23)
CO2: 24 mmol/L (ref 22–32)
Calcium: 8.1 mg/dL — ABNORMAL LOW (ref 8.9–10.3)
Chloride: 99 mmol/L (ref 98–111)
Creatinine, Ser: 1.39 mg/dL — ABNORMAL HIGH (ref 0.61–1.24)
GFR, Estimated: 56 mL/min — ABNORMAL LOW (ref >60)
Glucose, Bld: 179 mg/dL — ABNORMAL HIGH (ref 70–99)
Potassium: 4.2 mmol/L (ref 3.5–5.1)
Sodium: 132 mmol/L — ABNORMAL LOW (ref 135–145)

## 2023-07-03 LAB — CBC
HCT: 29.7 % — ABNORMAL LOW (ref 39.0–52.0)
Hemoglobin: 9.4 g/dL — ABNORMAL LOW (ref 13.0–17.0)
MCH: 24.3 pg — ABNORMAL LOW (ref 26.0–34.0)
MCHC: 31.6 g/dL (ref 30.0–36.0)
MCV: 76.7 fL — ABNORMAL LOW (ref 80.0–100.0)
Platelets: 161 10*3/uL (ref 150–400)
RBC: 3.87 MIL/uL — ABNORMAL LOW (ref 4.22–5.81)
RDW: 15.8 % — ABNORMAL HIGH (ref 11.5–15.5)
WBC: 8 10*3/uL (ref 4.0–10.5)
nRBC: 0 % (ref 0.0–0.2)

## 2023-07-03 LAB — RENAL FUNCTION PANEL
Albumin: 2.1 g/dL — ABNORMAL LOW (ref 3.5–5.0)
Anion gap: 8 (ref 5–15)
BUN: 14 mg/dL (ref 8–23)
CO2: 25 mmol/L (ref 22–32)
Calcium: 8.1 mg/dL — ABNORMAL LOW (ref 8.9–10.3)
Chloride: 99 mmol/L (ref 98–111)
Creatinine, Ser: 1.39 mg/dL — ABNORMAL HIGH (ref 0.61–1.24)
GFR, Estimated: 56 mL/min — ABNORMAL LOW (ref >60)
Glucose, Bld: 180 mg/dL — ABNORMAL HIGH (ref 70–99)
Phosphorus: 2.1 mg/dL — ABNORMAL LOW (ref 2.5–4.6)
Potassium: 4.2 mmol/L (ref 3.5–5.1)
Sodium: 132 mmol/L — ABNORMAL LOW (ref 135–145)

## 2023-07-03 LAB — LEGIONELLA PNEUMOPHILA SEROGP 1 UR AG: L. pneumophila Serogp 1 Ur Ag: NEGATIVE

## 2023-07-03 LAB — GLUCOSE, CAPILLARY
Glucose-Capillary: 115 mg/dL — ABNORMAL HIGH (ref 70–99)
Glucose-Capillary: 168 mg/dL — ABNORMAL HIGH (ref 70–99)

## 2023-07-03 MED ORDER — MECLIZINE HCL 25 MG PO TABS
25.0000 mg | ORAL_TABLET | Freq: Three times a day (TID) | ORAL | Status: DC
  Administered 2023-07-03 – 2023-07-04 (×3): 25 mg via ORAL
  Filled 2023-07-03 (×9): qty 1

## 2023-07-03 MED ORDER — ENOXAPARIN SODIUM 40 MG/0.4ML IJ SOSY
40.0000 mg | PREFILLED_SYRINGE | INTRAMUSCULAR | Status: DC
  Filled 2023-07-03: qty 0.4

## 2023-07-03 NOTE — Progress Notes (Signed)
 [facility]                                                                              [name], is a 68 y.o. male, DOB - [birth year], ZOX:[id number] Admit date - 06/30/2023    Outpatient Primary MD for the patient is Vincent Mask, MD  LOS - 3  days  Chief Complaint  Patient presents with   Cough       Brief summary   Patient is a 68 year old male with CKD stage IIIb, diabetes mellitus type 2, prior history of cavitary pneumonia, chronic back pain, presented with vomiting, severe productive cough, shortness of breath, drenching night sweats for the last 3 weeks.  Patient also reported pleuritic chest pain on the right side.  He was placed on oral antibiotics but was unable to tolerate. In ED, temp 98.6 F, RR 20, pulse 89, BP 114/62 WBCs 10.6, hemoglobin 10.8, MCV 78.0, platelets 198.  Sodium 129, potassium 3.9, BUN 28, creatinine 2.1.  Baseline creatinine 1.6-1.9 Chest x-ray showed consolidative opacity in the superior aspect of the right lower lobe concerning for pneumonia. CT chest abdomen pelvis showed partial right upper lobe consolidation, confluent with more rounded masslike consolidation in superior right lower lobe, demonstrates internal low-density areas and gas bubbles, suspicious for necrotic pneumonia and developing cavitation or possible abscess.  Additional areas of suspected inflammation and infection in the left upper lobe.  Assessment & Plan    Principal Problem: Right-sided cavitary pneumonia, left upper lobe pneumonia, multifocal -CT chest abdomen pelvis reviewed, - PCCM consulted - Continue IV Unasyn, Zithromax, Zyvox, (MRSA positive) -Urine strep antigen negative, sputum culture pending - Speech therapy recommended esophagogram   Active Problems: COVID+ -respiratory virus panel positive for COVID - Currently no hypoxia, ongoing symptoms for last 3 weeks - Likely incidental finding, continue supportive care   Acute kidney  injury superimposed on chronic kidney disease stage IIIb (HCC) - Baseline creatinine 1.6-1.9.  Received 1 L IV fluids in ED - Presented with creatinine of 2.1, was placed on gentle hydration - Creatinine improving, 1.6, labs still pending for today   Hyponatremia, acute - Likely due to dehydration, nausea and vomiting, sodium 129 on admission - continue IV fluids - Sodium improving, BMP pending    Chronic narcotic use, chronic back pain -Continue pain control    Diabetes mellitus type II uncontrolled with hyperglycemia with complication (HCC), CKD -Not taking metformin outpatient - Hemoglobin A1c 6.6 - Placed on sliding scale insulin, sensitive while inpatient CBG (last 3)  Recent Labs    07/02/23 1214 07/02/23 1718 07/03/23 0851  GLUCAP 148* 163* 115*     Estimated body mass index is 19.32 kg/m as calculated from the following:   Height as of this encounter: 5' 10.5" (1.791 m).   Weight as of this encounter: 62 kg.  Code Status: Full CODE STATUS DVT Prophylaxis:  enoxaparin (LOVENOX) injection 30 mg Start: 06/30/23 2100   Level of Care: Level of care: Telemetry Medical Family Communication: Updated patient Disposition Plan:      Remains inpatient appropriate:      Procedures:    Consultants:  Pulmonology  Antimicrobials:   Anti-infectives (From admission, onward)    Start     Dose/Rate Route Frequency Ordered Stop   07/02/23 2300  linezolid (ZYVOX) IVPB 600 mg        600 mg 300 mL/hr over 60 Minutes Intravenous Every 12 hours 07/02/23 2200     07/02/23 1600  Ampicillin-Sulbactam (UNASYN) 3 g in sodium chloride 0.9 % 100 mL IVPB        3 g 200 mL/hr over 30 Minutes Intravenous Every 6 hours 07/02/23 1433     07/01/23 1700  azithromycin (ZITHROMAX) 500 mg in sodium chloride 0.9 % 250 mL IVPB        500 mg 250 mL/hr over 60 Minutes Intravenous Every 24 hours 06/30/23 1842 07/06/23 1659   07/01/23 1215  linezolid (ZYVOX) tablet 600 mg  Status:  Discontinued         600 mg Oral Every 12 hours 07/01/23 1121 07/02/23 2200   07/01/23 1000  cefTRIAXone (ROCEPHIN) 2 g in sodium chloride 0.9 % 100 mL IVPB  Status:  Discontinued        2 g 200 mL/hr over 30 Minutes Intravenous Every 24 hours 06/30/23 1845 07/02/23 1433   07/01/23 0000  cefTRIAXone (ROCEPHIN) 2 g in sodium chloride 0.9 % 100 mL IVPB  Status:  Discontinued        2 g 200 mL/hr over 30 Minutes Intravenous Every 24 hours 06/30/23 1842 06/30/23 1845   06/30/23 1700  cefTRIAXone (ROCEPHIN) 1 g in sodium chloride 0.9 % 100 mL IVPB        1 g 200 mL/hr over 30 Minutes Intravenous  Once 06/30/23 1645 06/30/23 1903   06/30/23 1700  azithromycin (ZITHROMAX) 500 mg in sodium chloride 0.9 % 250 mL IVPB        500 mg 250 mL/hr over 60 Minutes Intravenous  Once 06/30/23 1645 06/30/23 1923          Medications  enoxaparin (LOVENOX) injection  30 mg Subcutaneous Q24H   insulin aspart  0-5 Units Subcutaneous QHS   insulin aspart  0-9 Units Subcutaneous TID WC   lactobacillus  1 g Oral TID WC      Subjective:   Vincent [name] was seen and examined today.  Feeling somewhat better, states phlegm is no longer green.  No acute nausea vomiting or fevers.   Vitals:   07/02/23 1631 07/02/23 2102 07/03/23 0440 07/03/23 0855  BP: (!) 101/58 101/63 123/74 130/72  Pulse: 91 91 74 82  Resp: 16 18 18    Temp: 98.9 F (37.2 C) 98.4 F (36.9 C) 98.6 F (37 C)   TempSrc:  Oral Oral   SpO2: 98% 99% 98% 98%  Weight:      Height:        Intake/Output Summary (Last 24 hours) at 07/03/2023 1122 Last data filed at 07/03/2023 0451 Gross per 24 hour  Intake 870.84 ml  Output --  Net 870.84 ml     Wt Readings from Last 3 Encounters:  07/01/23 62 kg  03/05/23 72.6 kg  02/28/23 75.3 kg   Physical Exam General: Alert and oriented x 3, NAD Cardiovascular: S1 S2 clear, RRR.  Respiratory: CTAB, no wheezing, rales Gastrointestinal: Soft, nontender, nondistended, NBS Ext: no pedal edema  bilaterally Neuro: no new deficits Skin: No rashes Psych: Normal affect  Data Reviewed:  I have personally reviewed following labs    CBC Lab Results  Component Value Date   WBC 10.6 (H) 06/30/2023  RBC 4.41 06/30/2023   HGB 10.8 (L) 06/30/2023   HCT 34.4 (L) 06/30/2023   MCV 78.0 (L) 06/30/2023   MCH 24.5 (L) 06/30/2023   PLT 198 06/30/2023   MCHC 31.4 06/30/2023   RDW 15.4 06/30/2023   LYMPHSABS 1.2 03/13/2021   MONOABS 0.4 03/13/2021   EOSABS 0.1 03/13/2021   BASOSABS 0.0 03/13/2021     Last metabolic panel Lab Results  Component Value Date   NA 131 (L) 07/01/2023   K 4.3 07/01/2023   CL 95 (L) 07/01/2023   CO2 26 07/01/2023   BUN 22 07/01/2023   CREATININE 1.68 (H) 07/01/2023   GLUCOSE 139 (H) 07/01/2023   GFRNONAA 44 (L) 07/01/2023   GFRAA >60 10/26/2019   CALCIUM 8.5 (L) 07/01/2023   PROT 6.4 (L) 01/01/2022   ALBUMIN 3.6 01/01/2022   BILITOT 0.6 01/01/2022   ALKPHOS 93 01/01/2022   AST 17 01/01/2022   ALT 14 01/01/2022   ANIONGAP 10 07/01/2023    CBG (last 3)  Recent Labs    07/02/23 1214 07/02/23 1718 07/03/23 0851  GLUCAP 148* 163* 115*      Coagulation Profile: No results for input(s): "INR", "PROTIME" in the last 168 hours.   Radiology Studies: I have personally reviewed the imaging studies  DG Swallowing Func-Speech Pathology Result Date: 07/02/2023 Table formatting from the original result was not included. Modified Barium Swallow Study Patient Details Name: THOMOS DOMINE MRN: [id number] Date of Birth: 18-Mar-1956 Today's Date: 07/02/2023 HPI/PMH: HPI: Patient is a 68 y.o. male with PMH: DM, facial basal cell cancer, h/o tobacco use, PNA, COVID-19 infection 2022 who presented to the hospital on 06/30/23 with recurrent cavitary PNA. He has lost significant amount of weight since having COVID-19 in 2022, reporting he dropped from 185 to 160 and currently weighing 137lbs. He feels that his throat is smaller since having Covid and this makes it  difficult to swallow large pills. MBS ordered to r/o aspiration as contributing to recurrent PNA. Clinical Impression: Clinical Impression: Patient presents with an oropharyngeal swallow that appears [facility] and is without aspiration of any tested barium consistency. He did exhibit an instance of flash penetration of thin liquids which remained well above vocal cords and fully exitied laryngeal vestibule. Only trace amount of vallecular sinus residuals observed s/p initial swallows with liquids. Prominent cricopharyngeal bar observed and this, in combination with ACDF hardware, resulted in mild narrowing of esophagus just below level of PES but no significant barium residuals observed. During esophageal sweep, SLP did observe appearance of suspected barium stasis in distal esophagus but no retrograde movement of barium. SLP is not recommmending further skilled services, however a barium esophagram may be beneficial to fully assess patient's esophageal function. (See MBS image slide RF #[id number]). Factors that may increase risk of adverse event in presence of aspiration Rubye Oaks [employer] Clearance Coots 2021): Factors that may increase risk of adverse event in presence of aspiration Rubye Oaks [employer] Clearance Coots 2021): Poor general health and/or compromised immunity Recommendations/Plan: Swallowing Evaluation Recommendations Swallowing Evaluation Recommendations Recommendations: PO diet PO Diet Recommendation: Regular; Thin liquids (Level 0) Liquid Administration via: Cup; Straw Medication Administration: Other (Comment) (as tolerated) Supervision: Patient able to self-feed Swallowing strategies  : Slow rate; Small bites/sips Postural changes: Position pt fully upright for meals; Stay upright 30-60 min after meals Oral care recommendations: Oral care BID (2x/day) Treatment Plan Treatment Plan Treatment recommendations: No treatment recommended at this time Follow-up recommendations: No SLP follow up Functional status assessment: Patient has not had a  recent decline in their functional status. Recommendations Recommendations for follow up therapy are one component of a multi-disciplinary discharge planning process, led by the attending physician.  Recommendations may be updated based on patient status, additional functional criteria and insurance authorization. Assessment: Orofacial Exam: Orofacial Exam Oral Cavity: Oral Hygiene: WFL Oral Cavity - Dentition: Edentulous; Poor condition Orofacial Anatomy: WFL Oral Motor/Sensory Function: WFL Anatomy: Anatomy: Presence of cervical hardware; Prominent cricopharyngeus Boluses Administered: Boluses Administered Boluses Administered: Thin liquids (Level 0); Mildly thick liquids (Level 2, nectar thick); Moderately thick liquids (Level 3, honey thick); Puree; Solid  Oral Impairment Domain: Oral Impairment Domain Lip Closure: No labial escape Tongue control during bolus hold: Cohesive bolus between tongue to palatal seal Bolus preparation/mastication: Timely and efficient chewing and mashing Bolus transport/lingual motion: Brisk tongue motion Oral residue: Complete oral clearance Location of oral residue : N/A Initiation of pharyngeal swallow : Valleculae  Pharyngeal Impairment Domain: Pharyngeal Impairment Domain Soft palate elevation: No bolus between soft palate (SP)/pharyngeal wall (PW) Laryngeal elevation: Complete superior movement of thyroid cartilage with complete approximation of arytenoids to epiglottic petiole Anterior hyoid excursion: Complete anterior movement Epiglottic movement: Complete inversion Laryngeal vestibule closure: Complete, no air/contrast in laryngeal vestibule Pharyngeal stripping wave : Present - complete Pharyngeal contraction (A/P view only): N/A Pharyngoesophageal segment opening: Complete distension and complete duration, no obstruction of flow Tongue base retraction: No contrast between tongue base and posterior pharyngeal wall (PPW) Pharyngeal residue: Trace residue within or on  pharyngeal structures Location of pharyngeal residue: Valleculae  Esophageal Impairment Domain: Esophageal Impairment Domain Esophageal clearance upright position: Esophageal retention with retrograde flow below pharyngoesophageal segment (PES) Pill: Pill Consistency administered: Thin liquids (Level 0) Thin liquids (Level 0): [facility] Penetration/Aspiration Scale Score: Penetration/Aspiration Scale Score 1.  Material does not enter airway: Mildly thick liquids (Level 2, nectar thick); Moderately thick liquids (Level 3, honey thick); Puree; Solid 2.  Material enters airway, remains ABOVE vocal cords then ejected out: Thin liquids (Level 0) Compensatory Strategies: Compensatory Strategies Compensatory strategies: Yes Straw: Effective Effective Straw: Thin liquid (Level 0)   General Information: No data recorded Diet Prior to this Study: Regular; Thin liquids (Level 0)   Temperature : Normal   Respiratory Status: WFL   Supplemental O2: None (Room air)   History of Recent Intubation: No  Behavior/Cognition: Alert; Cooperative; Pleasant mood Self-Feeding Abilities: Able to self-feed Baseline vocal quality/speech: Normal Volitional Cough: Able to elicit No data recorded Exam Limitations: No limitations Goal Planning: Prognosis for improved oropharyngeal function: Good No data recorded No data recorded No data recorded Consulted and agree with results and recommendations: Patient Pain: Pain Assessment Pain Assessment: No/denies pain End of Session: Start Time:No data recorded Stop Time: No data recorded Time Calculation:No data recorded Charges: No data recorded SLP visit diagnosis: SLP Visit Diagnosis: Dysphagia, pharyngoesophageal phase (R13.14) Past Medical History: Past Medical History: Diagnosis Date  Arthritis   CKD (chronic kidney disease)   Complication of anesthesia   Reported intraoperative bleeding and "almost lost him" with back surgey around age 44, but no bleeding issues with subsequent surgeries  COVID 2022   Hospital admission  Diabetes mellitus without complication (HCC)   per patient type 2  Facial basal cell cancer   Heart murmur   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   Nerve pain   per patient, both legs  Pneumonia  Past Surgical History: Past Surgical History: Procedure Laterality Date  ANTERIOR CERVICAL DECOMP/DISCECTOMY FUSION N/A 05/20/2022  Procedure: ACDF -  C3-C4 - C4-C5 - C5-C6;  Surgeon: Tia Alert, MD;  Location: [facility] OR;  Service: Neurosurgery;  Laterality: N/A;  BACK SURGERY    x2  BRONCHIAL BIOPSY  03/15/2021  Procedure: BRONCHIAL BIOPSIES;  Surgeon: Tomma Lightning, MD;  Location: [facility] ENDOSCOPY;  Service: Pulmonary;;  BRONCHIAL WASHINGS  03/15/2021  Procedure: BRONCHIAL WASHINGS;  Surgeon: Tomma Lightning, MD;  Location: [facility]COPY;  Service: Pulmonary;;  HERNIA REPAIR    umbilical and right inguinal hernia  INCISION AND DRAINAGE OF WOUND Left 10/27/2016  Procedure: IRRIGATION AND DEBRIDEMENT LEFT PATELLA;  Surgeon: Cammy Copa, MD;  Location: [facility] OR;  Service: Orthopedics;  Laterality: Left;  LESION REMOVAL Left 11/01/2019  Procedure: LEFT FACIAL LESION REMOVAL;  Surgeon: Serena Colonel, MD;  Location[facility] OR;  Service: ENT;  Laterality: Left;  RADIOLOGY WITH ANESTHESIA N/A 02/05/2022  Procedure: MRI WITH ANESTHESIA OF CERVICAL SPINE WITHOUT CONTRAST;  Surgeon: Radiologist, Medication, MD;  Location: [facility] OR;  Service: Radiology;  Laterality: N/A;  SPINE SURGERY    x2  VIDEO BRONCHOSCOPY N/A 03/15/2021  Procedure: VIDEO BRONCHOSCOPY WITH FLUORO;  Surgeon: Tomma Lightning, MD;  Location: [facility];  Service: Pulmonary;  Laterality: N/A; Angela Nevin, MA, CCC-SLP Speech Therapy       Thad Ranger M.D. [facility] 07/03/2023, 11:22 AM  Available via Epic secure chat 7am-7pm After 7 pm, please refer to night coverage provider listed on amion.

## 2023-07-03 NOTE — Progress Notes (Signed)
 Transition of Care [facility]) - Inpatient Brief Assessment   Patient Details  Name: Vincent [name] MRN: [id number] Date of Birth: [birth year]  [facility] CM/SW Contact:    Janae Bridgeman, RN Phone Number: 07/03/2023, 4:07 PM   Clinical Narrative: Patient admitted to the hospital for necrotizing pneumonia.  Patient was seen by PT and patient refusing home health services.  No TOC needs at this time.   Transition of Care Asessment: Insurance and Status: (P) Insurance coverage has been reviewed Patient has primary care physician: (P) Yes Home environment has been reviewed: (P) from home Prior level of function:: (P) Independent Prior/Current Home Services: (P) No current home services Social Drivers of Health Review: (P) SDOH reviewed no interventions necessary Readmission risk has been reviewed: (P) Yes Transition of care needs: (P) no transition of care needs at this time

## 2023-07-03 NOTE — Plan of Care (Signed)
  Problem: Education: Goal: Knowledge of General Education information will improve Description: Including pain rating scale, medication(s)/side effects and non-pharmacologic comfort measures Outcome: Progressing   Problem: Health Behavior/Discharge Planning: Goal: Ability to manage health-related needs will improve Outcome: Progressing   Problem: Clinical Measurements: Goal: Ability to maintain clinical measurements within normal limits will improve Outcome: Progressing Goal: Will remain free from infection Outcome: Progressing Goal: Diagnostic test results will improve Outcome: Progressing Goal: Respiratory complications will improve Outcome: Progressing Goal: Cardiovascular complication will be avoided Outcome: Progressing   Problem: Activity: Goal: Risk for activity intolerance will decrease Outcome: Progressing   Problem: Nutrition: Goal: Adequate nutrition will be maintained Outcome: Progressing   Problem: Coping: Goal: Level of anxiety will decrease Outcome: Progressing   Problem: Elimination: Goal: Will not experience complications related to bowel motility Outcome: Progressing Goal: Will not experience complications related to urinary retention Outcome: Progressing   Problem: Pain Managment: Goal: General experience of comfort will improve and/or be controlled Outcome: Progressing   Problem: Safety: Goal: Ability to remain free from injury will improve Outcome: Progressing   Problem: Skin Integrity: Goal: Risk for impaired skin integrity will decrease Outcome: Progressing   Problem: Activity: Goal: Ability to tolerate increased activity will improve Outcome: Progressing   Problem: Clinical Measurements: Goal: Ability to maintain a body temperature in the normal range will improve Outcome: Progressing   Problem: Respiratory: Goal: Ability to maintain adequate ventilation will improve Outcome: Progressing Goal: Ability to maintain a clear airway  will improve Outcome: Progressing   Problem: Education: Goal: Knowledge of risk factors and measures for prevention of condition will improve Outcome: Progressing   Problem: Coping: Goal: Psychosocial and spiritual needs will be supported Outcome: Progressing   Problem: Respiratory: Goal: Will maintain a patent airway Outcome: Progressing Goal: Complications related to the disease process, condition or treatment will be avoided or minimized Outcome: Progressing   Problem: Education: Goal: Ability to describe self-care measures that may prevent or decrease complications (Diabetes Survival Skills Education) will improve Outcome: Progressing Goal: Individualized Educational Video(s) Outcome: Progressing   Problem: Coping: Goal: Ability to adjust to condition or change in health will improve Outcome: Progressing   Problem: Fluid Volume: Goal: Ability to maintain a balanced intake and output will improve Outcome: Progressing   Problem: Health Behavior/Discharge Planning: Goal: Ability to identify and utilize available resources and services will improve Outcome: Progressing Goal: Ability to manage health-related needs will improve Outcome: Progressing   Problem: Metabolic: Goal: Ability to maintain appropriate glucose levels will improve Outcome: Progressing   Problem: Nutritional: Goal: Maintenance of adequate nutrition will improve Outcome: Progressing Goal: Progress toward achieving an optimal weight will improve Outcome: Progressing   Problem: Skin Integrity: Goal: Risk for impaired skin integrity will decrease Outcome: Progressing   Problem: Tissue Perfusion: Goal: Adequacy of tissue perfusion will improve Outcome: Progressing

## 2023-07-03 NOTE — Progress Notes (Signed)
 NAME:  Vincent [name], MRN:  [id number], DOB:  02-20-1956, LOS: 3 ADMISSION DATE:  06/30/2023, CONSULTATION DATE: 07/01/2023 REFERRING MD: Dr. Isidoro Donning CHIEF COMPLAINT: Cavitary pneumonia  History of Present Illness:   68 year old with history of diabetes, facial basal cell cancer, pneumonia, COVID-19 infection 2023 presents with recurrent cavitary pneumonia   He has a history of pneumonia in 2022, which was in the right lower lobe secondary to Streptococcus intermedius and resolved with treatment, leaving some scarring.  He had a bronchoscopy with BAL and lung biopsy at that time.  He denies current smoking but has a history of smoking 30 years ago. He denies exposure to tuberculosis, has never been homeless or incarcerated, and is a native of [place]. He reports bringing up green sputum.  He experienced significant weight loss following COVID-19, dropping from approximately 185-190 pounds to 160 pounds, and currently weighs 137 pounds. He describes himself as 'skinny bones.'  He denies choking on food or having difficulty swallowing, although he mentions that his throat feels smaller since having COVID-19 in 2022, making it difficult to swallow large pills.  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  Pertinent  Medical History    has a past medical history of Arthritis, CKD (chronic kidney disease), Complication of anesthesia, COVID (2022), Diabetes mellitus without complication (HCC), Facial basal cell cancer, Heart murmur, Nerve pain, and Pneumonia.   Significant Hospital Events: Including procedures, antibiotic start and stop dates in addition to other pertinent events   4/7 Admit  Interim History / Subjective:  On room air States he is able to walk and go to bathroom   Objective   Blood pressure 130/72, pulse 82, temperature 98.6 F (37 C), temperature source Oral, resp. rate 18, height 5' 10.5"  (1.791 m), weight 62 kg, SpO2 98%.        Intake/Output Summary (Last 24 hours) at 07/03/2023 1254 Last data filed at 07/03/2023 0451 Gross per 24 hour  Intake 870.84 ml  Output --  Net 870.84 ml   Filed Weights   07/01/23 1231  Weight: 62 kg    Examination: General:  NAD HEENT: MM pink/moist Neuro: Aox3; MAE CV: s1s2, RRR, no m/r/g PULM:  dim clear BS bilaterally; RA GI: soft, bsx4 active  Extremities: warm/dry, no edema  Skin: no rashes or lesions   Resolved Hospital Problem list     Assessment & Plan:  Recurrent pneumonia Recurrent pneumonia in the right upper lobe. Previous pneumonia in 2022 in the right lower lobe resolved with scarring. Current pneumonia may be due to aspiration, as he reports dysphagia since COVID-19 in 2022. Significant weight loss from 185-190 lbs to 137 lbs. No current smoking, but smoked 30 years ago. No known TB exposure or other risk factors for recurrent pneumonia. Green sputum suggests bacterial infection. No need for lung surgery; focus on antibiotics and assessing for aspiration. P: -cont unasyn, zithromax, zyvox -follow sputum culture -pulm toilet  Positive COVID Noted to be COVID-positive on admission.  CT chest is not consistent with COVID-pneumonia and it may be an incidental finding P: -Supportive care  Dysphagia Dysphagia since COVID-19 in 2022, potentially contributing to recurrent pneumonia due to aspiration. Significant weight loss since onset. P: - barrium swallow yesterday w/ no signs of aspiration - slp following; recommending esophagram  Weight loss Significant weight loss from 185-190 lbs to 137 lbs since COVID-19 in 2022, likely related to  dysphagia and potential malnutrition. P: - Evaluate nutritional status and consider referral to a dietitian if necessary   Signature:   Chilton Greathouse MD [facility] See Amion for pager  If no response to pager , please call [phone] until 7pm After  7:00 pm call Elink  [phone] 07/03/2023, 12:54 PM

## 2023-07-03 NOTE — Evaluation (Signed)
 Physical Therapy Evaluation Patient Details Name: Vincent [name] MRN: [id number] DOB: 06-27-1955 Today's Date: 07/03/2023  History of Present Illness  The pt is a 68 yo male presenting 06/30/23 with cough--RLL cavitary pna; COVID+; AKI,   PMH includes: arthritis, CKD, DM II, ACDF C3-6, and back surgery x2 (last 4 months ago)  Clinical Impression  Pt admitted secondary to problem above with deficits below. PTA patient was living alone and becoming weaker due to prolonged illness requiring multiple rounds of antibiotics. Pt currently requires CGA for ambulation due to vertigo and generalized weakness. Pt tested + for Rt posterior canal BPPV and underwent Epley treatment x 2 with ++nausea. Unable to tolerate a reassessment or additional treatment at this time. MD and RN made aware of situation and PT plans to see pt 4/11 a.m. in anticipation of ?discharge home 4/11.  Anticipate patient will benefit from PT to address problems listed below.Will continue to follow acutely to maximize functional mobility independence and safety.           If plan is discharge home, recommend the following: A little help with walking and/or transfers;A little help with bathing/dressing/bathroom;Assistance with cooking/housework;Assist for transportation;Help with stairs or ramp for entrance   Can travel by private vehicle        Equipment Recommendations None recommended by PT  Recommendations for Other Services       Functional Status Assessment Patient has had a recent decline in their functional status and demonstrates the ability to make significant improvements in function in a reasonable and predictable amount of time.     Precautions / Restrictions Precautions Precautions: Fall Recall of Precautions/Restrictions: Impaired Precaution/Restrictions Comments: Been getting up alone to go to bathroom despite vertigo      Mobility  Bed Mobility Overal bed mobility: Independent             General  bed mobility comments: +spinning with supine to sit at R EOB    Transfers Overall transfer level: Needs assistance Equipment used: None Transfers: Sit to/from Stand Sit to Stand: Contact guard assist           General transfer comment: +vertigo with sit to stand as assisting pt to bathroom    Ambulation/Gait Ambulation/Gait assistance: Contact guard assist Gait Distance (Feet): 12 Feet (x2) Assistive device: None Gait Pattern/deviations: Step-through pattern, Shuffle, Wide base of support       General Gait Details: pt reaching for furniture, counter, etc  Stairs            Wheelchair Mobility     Tilt Bed    Modified Rankin (Stroke Patients Only)       Balance Overall balance assessment: Needs assistance Sitting-balance support: No upper extremity supported, Feet supported Sitting balance-Leahy Scale: Good     Standing balance support: Single extremity supported Standing balance-Leahy Scale: Poor                               Pertinent Vitals/Pain Pain Assessment Pain Assessment: No/denies pain    Home Living Family/patient expects to be discharged to:: Private residence Living Arrangements: Alone Available Help at Discharge: Friend(s);Family;Available PRN/intermittently Type of Home: House Home Access: Level entry;Stairs to enter (back is level; front) Entrance Stairs-Rails: Right;Left Entrance Stairs-Number of Steps: 6   Home Layout: One level Home Equipment: Cane - single point;Shower seat      Prior Function Prior Level of Function : Independent/Modified Independent  Mobility Comments: no device       Extremity/Trunk Assessment   Upper Extremity Assessment Upper Extremity Assessment: Overall WFL for tasks assessed    Lower Extremity Assessment Lower Extremity Assessment: Generalized weakness    Cervical / Trunk Assessment Cervical / Trunk Assessment: Normal  Communication    Communication Communication: No apparent difficulties    Cognition Arousal: Alert Behavior During Therapy: WFL for tasks assessed/performed   PT - Cognitive impairments: No apparent impairments                         Following commands: Intact       Cueing Cueing Techniques: Verbal cues     General Comments General comments (skin integrity, edema, etc.): On return to bed; assessed R sidelying test with +upward beating, rt rotating nystagmus and vertigo; performed Rt Epley with limited ability to obtain postion 3. Performed Rt Dix-Hallpike with nystagmus less intense and lesser duration. Completed Rt Epley again with lesser symptoms throughout, however upon sitting up pt afraid he was going to vomit. Further BPPV assessment and treatment deferred.    Exercises     Assessment/Plan    PT Assessment Patient needs continued PT services  PT Problem List Decreased strength;Decreased activity tolerance;Decreased balance;Decreased mobility;Decreased knowledge of use of DME;Decreased safety awareness       PT Treatment Interventions DME instruction;Gait training;Functional mobility training;Therapeutic activities;Therapeutic exercise;Stair training;Balance training;Neuromuscular re-education;Cognitive remediation;Patient/family education;Canalith reposition    PT Goals (Current goals can be found in the Care Plan section)  Acute Rehab PT Goals Patient Stated Goal: not have vertigo when he goes home alone PT Goal Formulation: With patient Time For Goal Achievement: 07/17/23 Potential to Achieve Goals: Good    Frequency Min 3X/week     Co-evaluation               AM-PAC PT "6 Clicks" Mobility  Outcome Measure Help needed turning from your back to your side while in a flat bed without using bedrails?: None Help needed moving from lying on your back to sitting on the side of a flat bed without using bedrails?: None Help needed moving to and from a bed to a chair  (including a wheelchair)?: A Little Help needed standing up from a chair using your arms (e.g., wheelchair or bedside chair)?: A Little Help needed to walk in hospital room?: A Little Help needed climbing 3-5 steps with a railing? : A Little 6 Click Score: 20    End of Session   Activity Tolerance: Treatment limited secondary to medical complications (Comment) (vertigo and nausea) Patient left: in bed;with call bell/phone within reach Nurse Communication: Mobility status;Other (comment) (spinning started last evening) PT Visit Diagnosis: Other abnormalities of gait and mobility (R26.89);BPPV;Dizziness and giddiness (R42) BPPV - Right/Left : Right    Time: 1610-9604 PT Time Calculation (min) (ACUTE ONLY): 42 min   Charges:   PT Evaluation $PT Eval Moderate Complexity: 1 Mod PT Treatments $Canalith Rep Proc: 8-22 mins PT General Charges $$ ACUTE PT VISIT: 1 Visit          Jerolyn Center, PT Acute Rehabilitation Services  Office [phone]   Zena Amos 07/03/2023, 3:30 PM

## 2023-07-04 DIAGNOSIS — R42 Dizziness and giddiness: Secondary | ICD-10-CM

## 2023-07-04 DIAGNOSIS — J850 Gangrene and necrosis of lung: Secondary | ICD-10-CM | POA: Diagnosis not present

## 2023-07-04 DIAGNOSIS — R112 Nausea with vomiting, unspecified: Secondary | ICD-10-CM

## 2023-07-04 DIAGNOSIS — J189 Pneumonia, unspecified organism: Principal | ICD-10-CM

## 2023-07-04 DIAGNOSIS — E860 Dehydration: Secondary | ICD-10-CM

## 2023-07-04 LAB — CULTURE, RESPIRATORY W GRAM STAIN
Culture: NORMAL
Special Requests: NORMAL

## 2023-07-04 MED ORDER — LORATADINE 10 MG PO TABS
10.0000 mg | ORAL_TABLET | Freq: Every day | ORAL | Status: DC
  Administered 2023-07-04: 10 mg via ORAL
  Filled 2023-07-04 (×2): qty 1

## 2023-07-04 MED ORDER — K PHOS MONO-SOD PHOS DI & MONO 155-852-130 MG PO TABS
500.0000 mg | ORAL_TABLET | Freq: Two times a day (BID) | ORAL | Status: DC
  Administered 2023-07-04: 500 mg via ORAL
  Filled 2023-07-04 (×3): qty 2

## 2023-07-04 MED ORDER — ONDANSETRON HCL 4 MG/2ML IJ SOLN: 4.0000 mg | Freq: Four times a day (QID) | INTRAMUSCULAR | Status: DC | PRN

## 2023-07-04 NOTE — TOC Progression Note (Signed)
 Transition of Care [facility]) - Progression Note    Patient Details  Name: LYNETTE NOAH MRN: [id number] Date of Birth: 1955/10/05  [facility] CM/SW Contact  Janae Bridgeman, RN Phone Number: 07/04/2023, 2:04 PM  Clinical Narrative:    CM spoke with Pt this morning and patient was agreeable to have Outpatient PT referral placed since he needs vestibular outpatient therapy and not open to home health.  I placed Outpatient referral in hub and included follow up in the AVS.        Expected Discharge Plan and Services                                               Social Determinants of Health (SDOH) Interventions SDOH Screenings   Food Insecurity: Food Insecurity Present (06/30/2023)  Housing: High Risk (06/30/2023)  Transportation Needs: No Transportation Needs (06/30/2023)  Utilities: Not At Risk (06/30/2023)  Social Connections: Moderately Isolated (06/30/2023)  Tobacco Use: Medium Risk (06/30/2023)    Readmission Risk Interventions    07/03/2023    4:06 PM  Readmission Risk Prevention Plan  Transportation Screening Complete  PCP or Specialist Appt within 5-7 Days Complete  Home Care Screening Complete  Medication Review (RN CM) Complete

## 2023-07-04 NOTE — Progress Notes (Signed)
 Physical Therapy Treatment Patient Details Name: Vincent [name] MRN: [id number] DOB: 06-05-1955 Today's Date: 07/04/2023   History of Present Illness The pt is a 68 yo male presenting 06/30/23 with cough--RLL cavitary pna; COVID+; AKI,   PMH includes: arthritis, CKD, DM II, ACDF C3-6, and back surgery x2 (last 4 months ago)    PT Comments  Patient ambulating unaided in the room.  Flexed posture with chronic back issues.  Declined further vestibular treatment due to not wanting to aggravate it.  Explained importance of treating if we can to clear while here, though he continued to decline.  Agreed to outpatient PT follow up if needed and RN case manager aware.  Will follow up if not d/c.   If plan is discharge home, recommend the following: Assistance with cooking/housework;Assist for transportation;Help with stairs or ramp for entrance   Can travel by private vehicle        Equipment Recommendations  None recommended by PT    Recommendations for Other Services       Precautions / Restrictions Precautions Precautions: Fall Recall of Precautions/Restrictions: Intact     Mobility  Bed Mobility Overal bed mobility: Modified Independent                  Transfers Overall transfer level: Modified independent Equipment used: None Transfers: Sit to/from Stand Sit to Stand: Modified independent (Device/Increase time)                Ambulation/Gait Ambulation/Gait assistance: Supervision Gait Distance (Feet): 20 Feet (x 2) Assistive device: None Gait Pattern/deviations: Step-through pattern, Decreased stride length, Trunk flexed       General Gait Details: no UE support though flexed throughout, reports chonic back issues and feels more stiff due to bedrest   Stairs             Wheelchair Mobility     Tilt Bed    Modified Rankin (Stroke Patients Only)       Balance Overall balance assessment: Needs assistance   Sitting balance-Leahy Scale:  Good       Standing balance-Leahy Scale: Good Standing balance comment: no LOB in standing and with ambulation in the room without UE support                            Communication Communication Communication: No apparent difficulties  Cognition Arousal: Alert Behavior During Therapy: WFL for tasks assessed/performed   PT - Cognitive impairments: No apparent impairments                         Following commands: Intact      Cueing Cueing Techniques: Verbal cues  Exercises      General Comments General comments (skin integrity, edema, etc.): states has a cane and can use if needed.  Has concerns about HHPT due to condition of his home since he has been ill.  Declined vestibular treatment today as it is "better" and he does not want to aggravate it. Encouraged testing as we can treat again here if not all better.  He continued to decline though agreed to outpatient PT follow up if needed and has a friend that can take him.  Case Manger notified to set up outpatient vestibular follow up.      Pertinent Vitals/Pain Pain Assessment Pain Assessment: Faces Faces Pain Scale: Hurts little more Pain Location: back (chronic) Pain Descriptors / Indicators: Aching, Tightness Pain  Intervention(s): Monitored during session, Repositioned    Home Living                          Prior Function            PT Goals (current goals can now be found in the care plan section) Progress towards PT goals: Progressing toward goals    Frequency    Min 3X/week      PT Plan      Co-evaluation              AM-PAC PT "6 Clicks" Mobility   Outcome Measure  Help needed turning from your back to your side while in a flat bed without using bedrails?: None Help needed moving from lying on your back to sitting on the side of a flat bed without using bedrails?: None Help needed moving to and from a bed to a chair (including a wheelchair)?: None Help  needed standing up from a chair using your arms (e.g., wheelchair or bedside chair)?: None Help needed to walk in hospital room?: None Help needed climbing 3-5 steps with a railing? : Total 6 Click Score: 21    End of Session   Activity Tolerance: Patient tolerated treatment well Patient left: in bed;with call bell/phone within reach;with nursing/sitter in room   PT Visit Diagnosis: Other abnormalities of gait and mobility (R26.89);BPPV;Dizziness and giddiness (R42) BPPV - Right/Left : Right     Time: 1610-9604 PT Time Calculation (min) (ACUTE ONLY): 16 min  Charges:    $Gait Training: 8-22 mins PT General Charges $$ ACUTE PT VISIT: 1 Visit                     Sheran Lawless, PT Acute Rehabilitation Services [phone] 07/04/2023    Elray Mcgregor 07/04/2023, 9:58 AM

## 2023-07-04 NOTE — Telephone Encounter (Signed)
 I do not see an order for a ct to schedule

## 2023-07-04 NOTE — Progress Notes (Addendum)
 [facility]                                                                              Vincent [name], is a 68 y.o. male, DOB - March 04, 1956, UJW:[id number] Admit date - 06/30/2023    Outpatient Primary MD for the patient is [name], MD  LOS - 4  days  Chief Complaint  Patient presents with   Cough       Brief summary   Patient is a 68 year old male with CKD stage IIIb, diabetes mellitus type 2, prior history of cavitary pneumonia, chronic back pain, presented with vomiting, severe productive cough, shortness of breath, drenching night sweats for the last 3 weeks.  Patient also reported pleuritic chest pain on the right side.  He was placed on oral antibiotics but was unable to tolerate. In ED, temp 98.6 F, RR 20, pulse 89, BP 114/62 WBCs 10.6, hemoglobin 10.8, MCV 78.0, platelets 198.  Sodium 129, potassium 3.9, BUN 28, creatinine 2.1.  Baseline creatinine 1.6-1.9 Chest x-ray showed consolidative opacity in the superior aspect of the right lower lobe concerning for pneumonia. CT chest abdomen pelvis showed partial right upper lobe consolidation, confluent with more rounded masslike consolidation in superior right lower lobe, demonstrates internal low-density areas and gas bubbles, suspicious for necrotic pneumonia and developing cavitation or possible abscess.  Additional areas of suspected inflammation and infection in the left upper lobe.  Assessment & Plan    Principal Problem: Right-sided cavitary pneumonia, left upper lobe pneumonia, multifocal -CT chest abdomen pelvis reviewed, - PCCM consulted - Continue IV Unasyn, Zithromax, Zyvox, (MRSA positive) -Urine strep antigen negative, sputum culture showed normal respiratory flora - Speech therapy recommended esophagogram, will be done outpatient - Per pulmonology, continue antibiotics, recommend 4 weeks of DeBerry therapy with Augmentin.  Follow-up in clinic in 6 to 8 weeks and we will reimage with CT  and esophagram to look for esophageal dysmotility   Active Problems: COVID+ -respiratory virus panel positive for COVID - Currently no hypoxia, ongoing symptoms for last 3 weeks - Likely incidental finding, continue supportive care   Acute kidney injury superimposed on chronic kidney disease stage IIIb (HCC) - Baseline creatinine 1.6-1.9.  Received 1 L IV fluids in ED - Presented with creatinine of 2.1, was placed on gentle hydration - Creatinine improved, 1.3  Vertigo/dizziness - Complaining of vertigo since yesterday evening, worked with PT for vestibular evaluation yesterday and it helped but today declining to work with PT -  Discussed about imaging of the brain, CT versus MRI, patient declined - Continue meclizine, Claritin, antiemetics as needed - Obtain B12, cortisol level, folate, TSH  Hyponatremia, acute - Likely due to dehydration, nausea and vomiting, sodium 129 on admission - Received fluids - Sodium improved, tolerating diet    Chronic narcotic use, chronic back pain -Continue pain control    Diabetes mellitus type II uncontrolled with hyperglycemia with complication (HCC), CKD -Not taking metformin outpatient - Hemoglobin A1c 6.6 - Placed on sliding scale insulin, sensitive while inpatient CBG (last 3)  Recent Labs    07/02/23 1718 07/03/23 0851 07/03/23 1150  GLUCAP 163* 115* 168*  Chronic microcytic anemia - Obtain anemia panel, no bleeding issues  Estimated body mass index is 19.32 kg/m as calculated from the following:   Height as of this encounter: 5' 10.5" (1.791 m).   Weight as of this encounter: 62 kg.  Code Status: Full CODE STATUS DVT Prophylaxis:  enoxaparin (LOVENOX) injection 40 mg Start: 07/03/23 2100   Level of Care: Level of care: Telemetry Medical Family Communication: Updated patient Disposition Plan:      Remains inpatient appropriate:   Hopefully DC home in a.m., feels uncomfortable going home today due to  vertigo.   Procedures:    Consultants:   Pulmonology  Antimicrobials:   Anti-infectives (From admission, onward)    Start     Dose/Rate Route Frequency Ordered Stop   07/02/23 2300  linezolid (ZYVOX) IVPB 600 mg        600 mg 300 mL/hr over 60 Minutes Intravenous Every 12 hours 07/02/23 2200     07/02/23 1600  Ampicillin-Sulbactam (UNASYN) 3 g in sodium chloride 0.9 % 100 mL IVPB        3 g 200 mL/hr over 30 Minutes Intravenous Every 6 hours 07/02/23 1433 08/02/23 1559   07/01/23 1700  azithromycin (ZITHROMAX) 500 mg in sodium chloride 0.9 % 250 mL IVPB        500 mg 250 mL/hr over 60 Minutes Intravenous Every 24 hours 06/30/23 1842 07/06/23 1659   07/01/23 1215  linezolid (ZYVOX) tablet 600 mg  Status:  Discontinued        600 mg Oral Every 12 hours 07/01/23 1121 07/02/23 2200   07/01/23 1000  cefTRIAXone (ROCEPHIN) 2 g in sodium chloride 0.9 % 100 mL IVPB  Status:  Discontinued        2 g 200 mL/hr over 30 Minutes Intravenous Every 24 hours 06/30/23 1845 07/02/23 1433   07/01/23 0000  cefTRIAXone (ROCEPHIN) 2 g in sodium chloride 0.9 % 100 mL IVPB  Status:  Discontinued        2 g 200 mL/hr over 30 Minutes Intravenous Every 24 hours 06/30/23 1842 06/30/23 1845   06/30/23 1700  cefTRIAXone (ROCEPHIN) 1 g in sodium chloride 0.9 % 100 mL IVPB        1 g 200 mL/hr over 30 Minutes Intravenous  Once 06/30/23 1645 06/30/23 1903   06/30/23 1700  azithromycin (ZITHROMAX) 500 mg in sodium chloride 0.9 % 250 mL IVPB        500 mg 250 mL/hr over 60 Minutes Intravenous  Once 06/30/23 1645 06/30/23 1923          Medications  enoxaparin (LOVENOX) injection  40 mg Subcutaneous Q24H   insulin aspart  0-5 Units Subcutaneous QHS   insulin aspart  0-9 Units Subcutaneous TID WC   lactobacillus  1 g Oral TID WC   meclizine  25 mg Oral TID      Subjective:   Vincent [name] was seen and examined today.  States having vertigo since evening yesterday.  PT evaluation helped yesterday  but declining today.  No vomiting, fever chills, having some nausea.    Vitals:   07/03/23 0855 07/03/23 1700 07/03/23 2006 07/04/23 0500  BP: 130/72 130/63 117/70 128/66  Pulse: 82 83 75 74  Resp:  16 17 18   Temp:   97.6 F (36.4 C) 98.3 F (36.8 C)  TempSrc:   Oral Oral  SpO2: 98% 98% 99% 95%  Weight:      Height:        Intake/Output  Summary (Last 24 hours) at 07/04/2023 1138 Last data filed at 07/03/2023 2039 Gross per 24 hour  Intake 300 ml  Output --  Net 300 ml     Wt Readings from Last 3 Encounters:  07/01/23 62 kg  03/05/23 72.6 kg  02/28/23 75.3 kg    Physical Exam General: Alert and oriented x 3, NAD Cardiovascular: S1 S2 clear, RRR.  Respiratory: Diminished breath sound at the bases Gastrointestinal: Soft, nontender, nondistended, NBS Ext: no pedal edema bilaterally Neuro: no new deficits Psych: Normal affect   Data Reviewed:  I have personally reviewed following labs    CBC Lab Results  Component Value Date   WBC 8.0 07/03/2023   RBC 3.87 (L) 07/03/2023   HGB 9.4 (L) 07/03/2023   HCT 29.7 (L) 07/03/2023   MCV 76.7 (L) 07/03/2023   MCH 24.3 (L) 07/03/2023   PLT 161 07/03/2023   MCHC 31.6 07/03/2023   RDW 15.8 (H) 07/03/2023   LYMPHSABS 1.2 03/13/2021   MONOABS 0.4 03/13/2021   EOSABS 0.1 03/13/2021   BASOSABS 0.0 03/13/2021     Last metabolic panel Lab Results  Component Value Date   NA 132 (L) 07/03/2023   NA 132 (L) 07/03/2023   K 4.2 07/03/2023   K 4.2 07/03/2023   CL 99 07/03/2023   CL 99 07/03/2023   CO2 24 07/03/2023   CO2 25 07/03/2023   BUN 13 07/03/2023   BUN 14 07/03/2023   CREATININE 1.39 (H) 07/03/2023   CREATININE 1.39 (H) 07/03/2023   GLUCOSE 179 (H) 07/03/2023   GLUCOSE 180 (H) 07/03/2023   GFRNONAA 56 (L) 07/03/2023   GFRNONAA 56 (L) 07/03/2023   GFRAA >60 10/26/2019   CALCIUM 8.1 (L) 07/03/2023   CALCIUM 8.1 (L) 07/03/2023   PHOS 2.1 (L) 07/03/2023   PROT 6.4 (L) 01/01/2022   ALBUMIN 2.1 (L) 07/03/2023    BILITOT 0.6 01/01/2022   ALKPHOS 93 01/01/2022   AST 17 01/01/2022   ALT 14 01/01/2022   ANIONGAP 9 07/03/2023   ANIONGAP 8 07/03/2023    CBG (last 3)  Recent Labs    07/02/23 1718 07/03/23 0851 07/03/23 1150  GLUCAP 163* 115* 168*      Coagulation Profile: No results for input(s): "INR", "PROTIME" in the last 168 hours.   Radiology Studies: I have personally reviewed the imaging studies  DG Swallowing Func-Speech Pathology Result Date: 07/03/2023 CLINICAL DATA:  Assess oropharyngeal swallow. EXAM: MODIFIED BARIUM SWALLOW TECHNIQUE: Different consistencies of barium were administered orally to the patient by the Speech Pathologist. Imaging of the pharynx was performed in the lateral projection. FLUOROSCOPY TIME:  Radiation Exposure Index (as provided by the fluoroscopic device): 16.40 mGy Kerma COMPARISON:  None Available. FINDINGS: Modified barium swallow was performed by the speech pathologist. Radiologist was not involved with this exam. Please refer to the Speech Pathology report for results and recommendations. IMPRESSION: Please refer to the Speech Pathologists report for complete details and recommendations. Electronically Signed   By: Richarda Overlie M.D.   On: 07/03/2023 17:10       Cristobal Advani M.D. [facility] 07/04/2023, 11:38 AM  Available via Epic secure chat 7am-7pm After 7 pm, please refer to night coverage provider listed on amion.

## 2023-07-04 NOTE — Telephone Encounter (Signed)
 In 8 weeks Dx is Cavitary pneumonia

## 2023-07-04 NOTE — Telephone Encounter (Signed)
 Dr. Isaiah Serge, please advise. Would you like CT with or without contrast?

## 2023-07-04 NOTE — Plan of Care (Signed)
  Problem: Education: Goal: Knowledge of General Education information will improve Description: Including pain rating scale, medication(s)/side effects and non-pharmacologic comfort measures Outcome: Progressing   Problem: Health Behavior/Discharge Planning: Goal: Ability to manage health-related needs will improve Outcome: Progressing   Problem: Clinical Measurements: Goal: Ability to maintain clinical measurements within normal limits will improve Outcome: Progressing Goal: Will remain free from infection Outcome: Progressing Goal: Diagnostic test results will improve Outcome: Progressing Goal: Respiratory complications will improve Outcome: Progressing Goal: Cardiovascular complication will be avoided Outcome: Progressing   Problem: Activity: Goal: Risk for activity intolerance will decrease Outcome: Progressing   Problem: Nutrition: Goal: Adequate nutrition will be maintained Outcome: Progressing   Problem: Coping: Goal: Level of anxiety will decrease Outcome: Progressing   Problem: Elimination: Goal: Will not experience complications related to bowel motility Outcome: Progressing Goal: Will not experience complications related to urinary retention Outcome: Progressing   Problem: Pain Managment: Goal: General experience of comfort will improve and/or be controlled Outcome: Progressing   Problem: Safety: Goal: Ability to remain free from injury will improve Outcome: Progressing   Problem: Skin Integrity: Goal: Risk for impaired skin integrity will decrease Outcome: Progressing   Problem: Activity: Goal: Ability to tolerate increased activity will improve Outcome: Progressing   Problem: Clinical Measurements: Goal: Ability to maintain a body temperature in the normal range will improve Outcome: Progressing   Problem: Respiratory: Goal: Ability to maintain adequate ventilation will improve Outcome: Progressing Goal: Ability to maintain a clear airway  will improve Outcome: Progressing   Problem: Education: Goal: Knowledge of risk factors and measures for prevention of condition will improve Outcome: Progressing   Problem: Coping: Goal: Psychosocial and spiritual needs will be supported Outcome: Progressing   Problem: Respiratory: Goal: Will maintain a patent airway Outcome: Progressing Goal: Complications related to the disease process, condition or treatment will be avoided or minimized Outcome: Progressing   Problem: Education: Goal: Ability to describe self-care measures that may prevent or decrease complications (Diabetes Survival Skills Education) will improve Outcome: Progressing Goal: Individualized Educational Video(s) Outcome: Progressing   Problem: Coping: Goal: Ability to adjust to condition or change in health will improve Outcome: Progressing   Problem: Fluid Volume: Goal: Ability to maintain a balanced intake and output will improve Outcome: Progressing   Problem: Health Behavior/Discharge Planning: Goal: Ability to identify and utilize available resources and services will improve Outcome: Progressing Goal: Ability to manage health-related needs will improve Outcome: Progressing   Problem: Metabolic: Goal: Ability to maintain appropriate glucose levels will improve Outcome: Progressing   Problem: Nutritional: Goal: Maintenance of adequate nutrition will improve Outcome: Progressing Goal: Progress toward achieving an optimal weight will improve Outcome: Progressing   Problem: Skin Integrity: Goal: Risk for impaired skin integrity will decrease Outcome: Progressing   Problem: Tissue Perfusion: Goal: Adequacy of tissue perfusion will improve Outcome: Progressing

## 2023-07-04 NOTE — Plan of Care (Signed)
 Problem: Education: Goal: Knowledge of General Education information will improve Description: Including pain rating scale, medication(s)/side effects and non-pharmacologic comfort measures 07/04/2023 0527 by Noemi Chapel, RN Outcome: Progressing 07/03/2023 2001 by Noemi Chapel, RN Outcome: Progressing   Problem: Health Behavior/Discharge Planning: Goal: Ability to manage health-related needs will improve 07/04/2023 0527 by Noemi Chapel, RN Outcome: Progressing 07/03/2023 2001 by Noemi Chapel, RN Outcome: Progressing   Problem: Clinical Measurements: Goal: Ability to maintain clinical measurements within normal limits will improve 07/04/2023 0527 by Noemi Chapel, RN Outcome: Progressing 07/03/2023 2001 by Noemi Chapel, RN Outcome: Progressing Goal: Will remain free from infection 07/04/2023 0527 by Noemi Chapel, RN Outcome: Progressing 07/03/2023 2001 by Noemi Chapel, RN Outcome: Progressing Goal: Diagnostic test results will improve 07/04/2023 0527 by Noemi Chapel, RN Outcome: Progressing 07/03/2023 2001 by Noemi Chapel, RN Outcome: Progressing Goal: Respiratory complications will improve 07/04/2023 0527 by Noemi Chapel, RN Outcome: Progressing 07/03/2023 2001 by Noemi Chapel, RN Outcome: Progressing Goal: Cardiovascular complication will be avoided 07/04/2023 0527 by Noemi Chapel, RN Outcome: Progressing 07/03/2023 2001 by Noemi Chapel, RN Outcome: Progressing   Problem: Activity: Goal: Risk for activity intolerance will decrease 07/04/2023 0527 by Noemi Chapel, RN Outcome: Progressing 07/03/2023 2001 by Noemi Chapel, RN Outcome: Progressing   Problem: Nutrition: Goal: Adequate nutrition will be maintained 07/04/2023 0527 by Noemi Chapel, RN Outcome: Progressing 07/03/2023 2001 by Noemi Chapel, RN Outcome: Progressing   Problem: Coping: Goal: Level of anxiety will decrease 07/04/2023 0527 by Noemi Chapel, RN Outcome: Progressing 07/03/2023 2001 by Noemi Chapel, RN Outcome: Progressing   Problem: Elimination: Goal: Will not experience complications related to bowel motility 07/04/2023 0527 by Noemi Chapel, RN Outcome: Progressing 07/03/2023 2001 by Noemi Chapel, RN Outcome: Progressing Goal: Will not experience complications related to urinary retention 07/04/2023 0527 by Noemi Chapel, RN Outcome: Progressing 07/03/2023 2001 by Noemi Chapel, RN Outcome: Progressing   Problem: Pain Managment: Goal: General experience of comfort will improve and/or be controlled 07/04/2023 0527 by Noemi Chapel, RN Outcome: Progressing 07/03/2023 2001 by Noemi Chapel, RN Outcome: Progressing   Problem: Safety: Goal: Ability to remain free from injury will improve 07/04/2023 0527 by Noemi Chapel, RN Outcome: Progressing 07/03/2023 2001 by Noemi Chapel, RN Outcome: Progressing   Problem: Skin Integrity: Goal: Risk for impaired skin integrity will decrease 07/04/2023 0527 by Noemi Chapel, RN Outcome: Progressing 07/03/2023 2001 by Noemi Chapel, RN Outcome: Progressing   Problem: Activity: Goal: Ability to tolerate increased activity will improve 07/04/2023 0527 by Noemi Chapel, RN Outcome: Progressing 07/03/2023 2001 by Noemi Chapel, RN Outcome: Progressing   Problem: Clinical Measurements: Goal: Ability to maintain a body temperature in the normal range will improve 07/04/2023 0527 by Noemi Chapel, RN Outcome: Progressing 07/03/2023 2001 by Noemi Chapel, RN Outcome: Progressing   Problem: Respiratory: Goal: Ability to maintain adequate ventilation will improve 07/04/2023 0527 by Noemi Chapel, RN Outcome: Progressing 07/03/2023 2001 by Noemi Chapel, RN Outcome: Progressing Goal: Ability to maintain a clear airway will improve 07/04/2023 0527 by Noemi Chapel, RN Outcome: Progressing 07/03/2023 2001 by Noemi Chapel,  RN Outcome: Progressing   Problem: Education: Goal: Knowledge of risk factors and measures for prevention of condition will improve 07/04/2023 0527 by Noemi Chapel, RN Outcome: Progressing 07/03/2023 2001 by Noemi Chapel, RN Outcome: Progressing  Problem: Coping: Goal: Psychosocial and spiritual needs will be supported 07/04/2023 0527 by Noemi Chapel, RN Outcome: Progressing 07/03/2023 2001 by Noemi Chapel, RN Outcome: Progressing   Problem: Respiratory: Goal: Will maintain a patent airway 07/04/2023 0527 by Noemi Chapel, RN Outcome: Progressing 07/03/2023 2001 by Noemi Chapel, RN Outcome: Progressing Goal: Complications related to the disease process, condition or treatment will be avoided or minimized 07/04/2023 0527 by Noemi Chapel, RN Outcome: Progressing 07/03/2023 2001 by Noemi Chapel, RN Outcome: Progressing   Problem: Education: Goal: Ability to describe self-care measures that may prevent or decrease complications (Diabetes Survival Skills Education) will improve 07/04/2023 0527 by Noemi Chapel, RN Outcome: Progressing 07/03/2023 2001 by Noemi Chapel, RN Outcome: Progressing Goal: Individualized Educational Video(s) 07/04/2023 0527 by Noemi Chapel, RN Outcome: Progressing 07/03/2023 2001 by Noemi Chapel, RN Outcome: Progressing   Problem: Coping: Goal: Ability to adjust to condition or change in health will improve 07/04/2023 0527 by Noemi Chapel, RN Outcome: Progressing 07/03/2023 2001 by Noemi Chapel, RN Outcome: Progressing   Problem: Fluid Volume: Goal: Ability to maintain a balanced intake and output will improve 07/04/2023 0527 by Noemi Chapel, RN Outcome: Progressing 07/03/2023 2001 by Noemi Chapel, RN Outcome: Progressing   Problem: Health Behavior/Discharge Planning: Goal: Ability to identify and utilize available resources and services will improve 07/04/2023 0527 by Noemi Chapel,  RN Outcome: Progressing 07/03/2023 2001 by Noemi Chapel, RN Outcome: Progressing Goal: Ability to manage health-related needs will improve 07/04/2023 0527 by Noemi Chapel, RN Outcome: Progressing 07/03/2023 2001 by Noemi Chapel, RN Outcome: Progressing   Problem: Metabolic: Goal: Ability to maintain appropriate glucose levels will improve 07/04/2023 0527 by Noemi Chapel, RN Outcome: Progressing 07/03/2023 2001 by Noemi Chapel, RN Outcome: Progressing   Problem: Nutritional: Goal: Maintenance of adequate nutrition will improve 07/04/2023 0527 by Noemi Chapel, RN Outcome: Progressing 07/03/2023 2001 by Noemi Chapel, RN Outcome: Progressing Goal: Progress toward achieving an optimal weight will improve 07/04/2023 0527 by Noemi Chapel, RN Outcome: Progressing 07/03/2023 2001 by Noemi Chapel, RN Outcome: Progressing   Problem: Skin Integrity: Goal: Risk for impaired skin integrity will decrease 07/04/2023 0527 by Noemi Chapel, RN Outcome: Progressing 07/03/2023 2001 by Noemi Chapel, RN Outcome: Progressing   Problem: Tissue Perfusion: Goal: Adequacy of tissue perfusion will improve 07/04/2023 0527 by Noemi Chapel, RN Outcome: Progressing 07/03/2023 2001 by Noemi Chapel, RN Outcome: Progressing

## 2023-07-04 NOTE — Telephone Encounter (Signed)
 Just to verify, do you want CT in 8 weeks or now? What is the dx?

## 2023-07-04 NOTE — Telephone Encounter (Signed)
 Ct without contrast. Thanks

## 2023-07-05 ENCOUNTER — Other Ambulatory Visit (HOSPITAL_COMMUNITY): Payer: Self-pay

## 2023-07-05 DIAGNOSIS — J850 Gangrene and necrosis of lung: Secondary | ICD-10-CM | POA: Diagnosis not present

## 2023-07-05 DIAGNOSIS — E118 Type 2 diabetes mellitus with unspecified complications: Secondary | ICD-10-CM | POA: Diagnosis not present

## 2023-07-05 DIAGNOSIS — J189 Pneumonia, unspecified organism: Secondary | ICD-10-CM | POA: Diagnosis not present

## 2023-07-05 DIAGNOSIS — R42 Dizziness and giddiness: Secondary | ICD-10-CM | POA: Diagnosis not present

## 2023-07-05 MED ORDER — LORATADINE 10 MG PO TABS
10.0000 mg | ORAL_TABLET | Freq: Every day | ORAL | 1 refills | Status: DC
  Filled 2023-07-05: qty 30, 30d supply, fill #0

## 2023-07-05 MED ORDER — AMOXICILLIN-POT CLAVULANATE 875-125 MG PO TABS
1.0000 | ORAL_TABLET | Freq: Two times a day (BID) | ORAL | 0 refills | Status: AC
Start: 2023-07-05 — End: 2023-08-04
  Filled 2023-07-05: qty 60, 30d supply, fill #0

## 2023-07-05 MED ORDER — MECLIZINE HCL 25 MG PO TABS
25.0000 mg | ORAL_TABLET | Freq: Three times a day (TID) | ORAL | 0 refills | Status: AC | PRN
  Filled 2023-07-05: qty 45, 15d supply, fill #0

## 2023-07-05 MED ORDER — ONDANSETRON HCL 8 MG PO TABS
8.0000 mg | ORAL_TABLET | Freq: Three times a day (TID) | ORAL | 0 refills | Status: AC | PRN
  Filled 2023-07-05: qty 30, 10d supply, fill #0

## 2023-07-05 MED ORDER — K PHOS MONO-SOD PHOS DI & MONO 155-852-130 MG PO TABS
500.0000 mg | ORAL_TABLET | Freq: Every day | ORAL | 0 refills | Status: AC
  Filled 2023-07-05: qty 6, 3d supply, fill #0

## 2023-07-05 MED ORDER — ACIDOPHILUS PO CAPS
ORAL_CAPSULE | Freq: Three times a day (TID) | ORAL | 1 refills | Status: DC
  Filled 2023-07-05: qty 90, 30d supply, fill #0

## 2023-07-05 NOTE — Discharge Summary (Signed)
 Physician Discharge Summary   Patient: Vincent [name] MRN: [id number] DOB: [birth year]  Admit date:     06/30/2023  Discharge date: 07/05/23  Discharge Physician: Bertram Brocks, MD    PCP: Candiss Chamorro, MD   Recommendations at discharge:   Augmentin 875-125 mg p.o. twice daily for 4 weeks.  Outpatient follow-up with pulmonology, Dr. Waylan Haggard and outpatient CT imaging to ensure resolution of pneumonia in 6 weeks.  Discharge Diagnoses:    Necrotizing pneumonia (HCC)   Vertigo   Acute kidney injury superimposed on chronic kidney disease stage IIIb (HCC)   COVID-19 virus infection   Chronic narcotic use   Diabetes mellitus with complication (HCC)   Pneumonia with cavity of lung   Chronic back pain   COVID-19 virus infection   Nausea and vomiting in adult   Dehydration   Community acquired pneumonia of right lower lobe of lung   Hospital Course:  Patient is a 68 year old male with CKD stage IIIb, diabetes mellitus type 2, prior history of cavitary pneumonia, chronic back pain, presented with vomiting, severe productive cough, shortness of breath, drenching night sweats for the last 3 weeks.  Patient also reported pleuritic chest pain on the right side.  He was placed on oral antibiotics but was unable to tolerate. In ED, temp 98.6 F, RR 20, pulse 89, BP 114/62 WBCs 10.6, hemoglobin 10.8, MCV 78.0, platelets 198.  Sodium 129, potassium 3.9, BUN 28, creatinine 2.1.  Baseline creatinine 1.6-1.9 Chest x-ray showed consolidative opacity in the superior aspect of the right lower lobe concerning for pneumonia. CT chest abdomen pelvis showed partial right upper lobe consolidation, confluent with more rounded masslike consolidation in superior right lower lobe, demonstrates internal low-density areas and gas bubbles, suspicious for necrotic pneumonia and developing cavitation or possible abscess.  Additional areas of suspected inflammation and infection in the left upper  lobe.   Assessment and Plan:  Right-sided cavitary pneumonia, left upper lobe pneumonia, multifocal -CT chest abdomen pelvis showed partial right upper lobe consolidation, confluent with more rounded masslike consolidation in superior right lower lobe, demonstrates internal low-density areas and gas bubbles, suspicious for necrotic pneumonia - PCCM consulted, placed on IV Unasyn, Zithromax, Zyvox, (MRSA positive) -Urine strep antigen negative, sputum culture showed normal respiratory flora - Speech therapy recommended esophagogram, will be done outpatient - Per pulmonology, continue antibiotics, recommend 4 weeks of Augmentin.  Follow-up in clinic in 6 to 8 weeks and will reimage with CT and esophagram to look for esophageal dysmotility     COVID+ -respiratory virus panel positive for COVID - Currently no hypoxia, ongoing symptoms for last 3 weeks - Likely incidental finding, continue supportive care    Acute kidney injury superimposed on chronic kidney disease stage IIIb (HCC) - Baseline creatinine 1.6-1.9.  Received 1 L IV fluids in ED - Presented with creatinine of 2.1, was placed on gentle hydration - Creatinine improved, 1.3   Vertigo/dizziness - Complaining of vertigo since yesterday evening, worked with PT for vestibular evaluation yesterday and it helped but today declining to work with PT -  Discussed about imaging of the brain, CT versus MRI, patient declined - Ordered B12, cortisol level, folate, TSH, patient also has been refusing labs. - Feeling somewhat better today, continue meclizine as needed, Claritin, antiemetics   Hyponatremia, acute - Likely due to dehydration, nausea and vomiting, sodium 129 on admission - Received fluids - Sodium improved, tolerating diet     Chronic narcotic use, chronic back pain -Continue pain control  Diabetes mellitus type II uncontrolled with hyperglycemia with complication (HCC), CKD -Not taking metformin outpatient - Hemoglobin  A1c 6.6 - Continue metformin  Chronic microcytic anemia - Baseline hemoglobin 10-11, recommend outpatient workup    Estimated body mass index is 19.32 kg/m as calculated from the following:   Height as of this encounter: 5' 10.5" (1.791 m).   Weight as of this encounter: 62 kg.        Pain control - [place]  Controlled Substance Reporting System database was reviewed. and patient was instructed, not to drive, operate heavy machinery, perform activities at heights, swimming or participation in water activities or provide baby-sitting services while on Pain, Sleep and Anxiety Medications; until their outpatient Physician has advised to do so again. Also recommended to not to take more than prescribed Pain, Sleep and Anxiety Medications.  Consultants: Pulmonary critical care Procedures performed: None Disposition: Home Diet recommendation:  Discharge Diet Orders (From admission, onward)     Start     Ordered   07/05/23 0000  Diet Carb Modified        07/05/23 0956            DISCHARGE MEDICATION: Allergies as of 07/05/2023       Reactions   Ivp Dye [iodinated Contrast Media] Nausea And Vomiting   Severe vomiting   Penicillins Nausea And Vomiting        Medication List     STOP taking these medications    diclofenac 75 MG EC tablet Commonly known as: VOLTAREN       TAKE these medications    Acidophilus Caps capsule Take 1 capsule by mouth 3 (three) times daily with meals.   amoxicillin-clavulanate 875-125 MG tablet Commonly known as: AUGMENTIN Take 1 tablet by mouth 2 (two) times daily for 4 weeks. What changed: when to take this   blood glucose meter kit and supplies Dispense based on patient and insurance preference. Use up to four times daily as directed. (FOR ICD-10 E11.8).   diclofenac Sodium 1 % Gel Commonly known as: VOLTAREN Apply 1 application topically daily as needed (back pain).   loratadine 10 MG tablet Commonly known as:  CLARITIN Take 1 tablet (10 mg total) by mouth daily. Start taking on: July 06, 2023   meclizine 25 MG tablet Commonly known as: ANTIVERT Take 1 tablet (25 mg total) by mouth 3 (three) times daily as needed for dizziness or nausea.   metFORMIN 500 MG 24 hr tablet Commonly known as: GLUCOPHAGE-XR Take 500 mg by mouth 2 (two) times daily.   ondansetron 8 MG tablet Commonly known as: ZOFRAN Take 1 tablet (8 mg total) by mouth every 8 (eight) hours as needed for vomiting or nausea. What changed: reasons to take this   oxyCODONE 15 MG immediate release tablet Commonly known as: ROXICODONE Take 1 tablet (15 mg total) by mouth every 6 (six) hours as needed for pain.   Phospha 250 Neutral 155-852-130 MG Tabs Take 2 tablets (500 mg total) by mouth daily for 3 days.   tamsulosin 0.4 MG Caps capsule Commonly known as: FLOMAX Take 0.4 mg by mouth daily after supper.        Follow-up Information     [facility] Follow up.   Specialty: Rehabilitation Why: Please call and follow up with the Outpatient rehabilitation center regarding need for Outpatient vestibular rehabilitation needs. Contact information: [address] [place]  [phone]  Candiss Chamorro, MD. Schedule an appointment as soon as possible for a visit in 2 week(s).   Specialty: Family Medicine Why: for hospital follow-up Contact information: [address] [place] [address] [phone]         Mannam, Praveen, MD. Schedule an appointment as soon as possible for a visit in 4 week(s).   Specialty: Pulmonary Disease Why: for hospital follow-up Contact information: [address] [place] [address] [place] [address] [phone]                Discharge Exam: Cleavon Curls Weights   07/01/23 1231  Weight: 62 kg   S: Feels better today, vertigo improving, working with PT and vestibular therapy this  morning.  Feels okay to go home today  BP 131/69 (BP Location: Right Arm)   Pulse 75   Temp 98.5 F (36.9 C) (Oral)   Resp 18   Ht 5' 10.5" (1.791 m)   Wt 62 kg   SpO2 97%   BMI 19.32 kg/m   Physical Exam General: Alert and oriented x 3, NAD Cardiovascular: S1 S2 clear, RRR.  Respiratory: CTAB, no wheezing Gastrointestinal: Soft, nontender, nondistended, NBS Ext: no pedal edema bilaterally Neuro: no new deficits Psych: Normal affect    Condition at discharge: fair  The results of significant diagnostics from this hospitalization (including imaging, microbiology, ancillary and laboratory) are listed below for reference.   Imaging Studies: DG Swallowing Func-Speech Pathology Result Date: 07/03/2023 CLINICAL DATA:  Assess oropharyngeal swallow. EXAM: MODIFIED BARIUM SWALLOW TECHNIQUE: Different consistencies of barium were administered orally to the patient by the Speech Pathologist. Imaging of the pharynx was performed in the lateral projection. FLUOROSCOPY TIME:  Radiation Exposure Index (as provided by the fluoroscopic device): 16.40 mGy Kerma COMPARISON:  None Available. FINDINGS: Modified barium swallow was performed by the speech pathologist. Radiologist was not involved with this exam. Please refer to the Speech Pathology report for results and recommendations. IMPRESSION: Please refer to the Speech Pathologists report for complete details and recommendations. Electronically Signed   By: Elene Griffes M.D.   On: 07/03/2023 17:10   CT CHEST ABDOMEN PELVIS WO CONTRAST Result Date: 06/30/2023 CLINICAL DATA:  Sepsis emesis EXAM: CT CHEST, ABDOMEN AND PELVIS WITHOUT CONTRAST TECHNIQUE: Multidetector CT imaging of the chest, abdomen and pelvis was performed following the standard protocol without IV contrast. RADIATION DOSE REDUCTION: This exam was performed according to the departmental dose-optimization program which includes automated exposure control, adjustment of the mA and/or kV  according to patient size and/or use of iterative reconstruction technique. COMPARISON:  Chest x-ray 06/30/2023, chest CT 05/25/2021 FINDINGS: CT CHEST FINDINGS Cardiovascular: Limited evaluation without intravenous contrast. Mild aortic atherosclerosis. No aneurysm. Coronary vascular calcification. Normal cardiac size. No pericardial effusion Mediastinum/Nodes: Patent trachea. No thyroid mass. Mildly prominent precarinal lymph node measuring up to 12 mm. Subcarinal lymph node measures 11 mm. Esophagus within normal limits Lungs/Pleura: Emphysema. Bandlike density in the right lower lobe with mild bronchiectasis consistent with scarring. Partial right upper lobe consolidation, confluent with more rounded masslike consolidation in the superior right lower lobe. This demonstrates internal low-density areas and gas bubbles. Peribronchovascular ground-glass nodularity inferior to the consolidation. Small foci of ground-glass density in the left upper lobe is well. Musculoskeletal: Sternum appears intact. Scoliosis of the spine. No acute osseous abnormality CT ABDOMEN PELVIS FINDINGS Hepatobiliary: Gallstone.  No biliary dilatation Pancreas: Unremarkable. No pancreatic ductal dilatation or surrounding inflammatory changes. Spleen: Enlarged, measures 16 cm craniocaudal. Adrenals/Urinary Tract: Adrenal glands are normal.  Kidneys show no hydronephrosis. The bladder is normal Stomach/Bowel: Stomach nonenlarged. No dilated small bowel. No acute bowel wall thickening. Negative appendix. Vascular/Lymphatic: Aortic atherosclerosis. No enlarged abdominal or pelvic lymph nodes. Reproductive: Enlarged prostate Other: Negative for pelvic effusion or free air Musculoskeletal: Partially visualized anterior fusion hardware in the cervical spine. Posterior spinal fusion hardware at L3-L4 with interbody device. Fixating screws at L3 appear to breach the superior endplate of L3. Prominent lucency at the left fixating screw of L4.  Right-sided fixating screw at L4 along the lateral cortical margin of L4 vertebral body. Apparent 2 interbody devices at L3-L4, with the more left-sided device tilted anterior and inferior. IMPRESSION: 1. Partial right upper lobe consolidation, confluent with more rounded masslike consolidation in the superior right lower lobe. This demonstrates internal low-density areas and gas bubbles, suspicious for necrotic pneumonia and developing cavitation or possible abscess. Imaging follow-up to resolution advised to exclude underlying mass given the rounded masslike features of consolidation at the superior right lower lobe. Additional areas of suspected inflammation or infection in the left upper lobe. 2. Mildly prominent mediastinal lymph nodes, likely reactive. 3. Emphysema. 4. Gallstone. 5. Splenomegaly. Electronically Signed   By: Esmeralda Hedge M.D.   On: 06/30/2023 21:42   DG Chest 2 View Result Date: 06/30/2023 CLINICAL DATA:  Chest pain. EXAM: CHEST - 2 VIEW COMPARISON:  CT chest dated 05/26/2021. FINDINGS: The heart size and mediastinal contours are within normal limits. Focal opacification of the superior aspect of the right lower lobe. The left lung is clear. No pleural effusion or pneumothorax. No acute osseous abnormality. IMPRESSION: Consolidative opacity in the superior aspect of the right lower lobe is concerning for pneumonia. Followup PA and lateral chest X-ray is recommended in 3-4 weeks following trial of antibiotic therapy to ensure resolution. Electronically Signed   By: Mannie Seek M.D.   On: 06/30/2023 16:02    Microbiology: Results for orders placed or performed during the hospital encounter of 06/30/23  Resp panel by RT-PCR (RSV, Flu A&B, Covid) Anterior Nasal Swab     Status: Abnormal   Collection Time: 07/01/23  2:52 AM   Specimen: Anterior Nasal Swab  Result Value Ref Range Status   SARS Coronavirus 2 by RT PCR POSITIVE (A) NEGATIVE Final   Influenza A by PCR NEGATIVE NEGATIVE  Final   Influenza B by PCR NEGATIVE NEGATIVE Final    Comment: (NOTE) The Xpert Xpress SARS-CoV-2/FLU/RSV plus assay is intended as an aid in the diagnosis of influenza from Nasopharyngeal swab specimens and should not be used as a sole basis for treatment. Nasal washings and aspirates are unacceptable for Xpert Xpress SARS-CoV-2/FLU/RSV testing.  Fact Sheet for Patients: BloggerCourse.com  Fact Sheet for Healthcare Providers: SeriousBroker.it  This test is not yet approved or cleared by the [place]  FDA and has been authorized for detection and/or diagnosis of SARS-CoV-2 by FDA under an Emergency Use Authorization (EUA). This EUA will remain in effect (meaning this test can be used) for the duration of the COVID-19 declaration under Section 564(b)(1) of the Act, 21 U.S.C. section 360bbb-3(b)(1), unless the authorization is terminated or revoked.     Resp Syncytial Virus by PCR NEGATIVE NEGATIVE Final    Comment: (NOTE) Fact Sheet for Patients: BloggerCourse.com  Fact Sheet for Healthcare Providers: SeriousBroker.it  This test is not yet approved or cleared by the [place]  FDA and has been authorized for detection and/or diagnosis of SARS-CoV-2 by FDA under an Emergency Use Authorization (EUA). This EUA will remain  in effect (meaning this test can be used) for the duration of the COVID-19 declaration under Section 564(b)(1) of the Act, 21 U.S.C. section 360bbb-3(b)(1), unless the authorization is terminated or revoked.  Performed at [facility], [address] [place], [place] [address]   Expectorated Sputum Assessment w Gram Stain, Rflx to Resp Cult     Status: None   Collection Time: 07/01/23  6:37 PM   Specimen: Expectorated Sputum  Result Value Ref Range Status   Specimen Description EXPECTORATED SPUTUM  Final   Special Requests Normal  Final    Sputum evaluation   Final    THIS SPECIMEN IS ACCEPTABLE FOR SPUTUM CULTURE Performed at [facility], [address] [place], [place] [address]    Report Status 07/01/2023 FINAL  Final  Culture, Respiratory w Gram Stain     Status: None   Collection Time: 07/01/23  6:37 PM  Result Value Ref Range Status   Specimen Description EXPECTORATED SPUTUM  Final   Special Requests Normal Reflexed from [id number]  Final   Gram Stain   Final    ABUNDANT WBC PRESENT, PREDOMINANTLY PMN RARE GRAM POSITIVE COCCI    Culture   Final    RARE Normal respiratory flora-no Staph aureus or Pseudomonas seen Performed at [facility], [address], [place], [place] [address]    Report Status 07/04/2023 FINAL  Final    Labs: CBC: Recent Labs  Lab 06/30/23 1429 07/03/23 1146  WBC 10.6* 8.0  HGB 10.8* 9.4*  HCT 34.4* 29.7*  MCV 78.0* 76.7*  PLT 198 161   Basic Metabolic Panel: Recent Labs  Lab 06/30/23 1429 07/01/23 1034 07/03/23 1146  NA 129* 131* 132*  132*  K 3.9 4.3 4.2  4.2  CL 92* 95* 99  99  CO2 25 26 25  24   GLUCOSE 207* 139* 180*  179*  BUN 28* 22 14  13   CREATININE 2.10* 1.68* 1.39*  1.39*  CALCIUM 8.7* 8.5* 8.1*  8.1*  PHOS  --   --  2.1*   Liver Function Tests: Recent Labs  Lab 07/03/23 1146  ALBUMIN 2.1*   CBG: Recent Labs  Lab 07/02/23 0749 07/02/23 1214 07/02/23 1718 07/03/23 0851 07/03/23 1150  GLUCAP 132* 148* 163* 115* 168*    Discharge time spent: greater than 30 minutes.  Signed: Bertram Brocks, MD [facility] 07/05/2023

## 2023-07-05 NOTE — Progress Notes (Signed)
 Physical Therapy Treatment Patient Details Name: Vincent [name] MRN: [id number] DOB: 1955/08/01 Today's Date: 07/05/2023   History of Present Illness The pt is a 68 yo male presenting 06/30/23 with cough--RLL cavitary pna; COVID+; AKI,   PMH includes: arthritis, CKD, DM II, ACDF C3-6, and back surgery x2 (last 4 months ago)    PT Comments  Patient Denies vertigo since last session, however has been moving very slowly and avoiding turning his head. Explained rationale for reassessing for BPPV and treatment if indicated. Patient agreed to allow vestibular assessment. As initiated R Dix-Hallpike, pt with upbeating R rotary nystagmus when HOB still at 30 degrees. Pt asked to stop moving HOB towards flat and with incr time and explanation ultimately allowed return to flat only (pt with small amount of next extension due to pillow under his shoulders). Pt allowed R Epley from this position. +spinning with 3rd and 4 th positions. Pt did not want to reassess at this time due to nausea. Agrees to OPPT follow-up.   If plan is discharge home, recommend the following: Assistance with cooking/housework;Assist for transportation;Help with stairs or ramp for entrance   Can travel by private vehicle        Equipment Recommendations  None recommended by PT    Recommendations for Other Services       Precautions / Restrictions Precautions Precautions: Fall Recall of Precautions/Restrictions: Intact Restrictions Weight Bearing Restrictions Per Provider Order: No     Mobility  Bed Mobility Overal bed mobility: Modified Independent                  Transfers                   General transfer comment: reports no spinning if he moves slowly; he deferred mobility as had just returned from walking to bathroom    Ambulation/Gait               General Gait Details: reports no spinning if he moves slowly; he deferred mobility as had just returned from walking to  bathroom   Stairs             Wheelchair Mobility     Tilt Bed    Modified Rankin (Stroke Patients Only)       Balance Overall balance assessment: Needs assistance Sitting-balance support: No upper extremity supported, Feet supported Sitting balance-Leahy Scale: Good                                      Communication Communication Communication: No apparent difficulties  Cognition Arousal: Alert Behavior During Therapy: WFL for tasks assessed/performed   PT - Cognitive impairments: No apparent impairments                         Following commands: Intact      Cueing Cueing Techniques: Verbal cues  Exercises      General Comments General comments (skin integrity, edema, etc.): Denies vertigo since last session, however has been moving very slowly and avoiding turning his head. Explained rationale for reassessing for BPPV and treatment if indicated. Patient agreed to allow vestibular assessment. As initiated R Dix-Hallpike, pt with upbeating R rotary nystagmus when HOB still at 30 degrees. Pt asked to stop moving HOB towards flat and with incr time and explanation ultimately allowed return to flat only (pt with small amount of next  extension due to pillow under his shoulders). Pt allowed R Epley from this position. +spinning with 3rd and 4 th positions. Pt did not want to reassess at this time due to nausea. Agrees to OPPT follow-up.      Pertinent Vitals/Pain Pain Assessment Pain Assessment: No/denies pain    Home Living                          Prior Function            PT Goals (current goals can now be found in the care plan section) Acute Rehab PT Goals Patient Stated Goal: not have vertigo when he goes home alone Time For Goal Achievement: 07/17/23 Potential to Achieve Goals: Good Progress towards PT goals: Progressing toward goals    Frequency    Min 3X/week      PT Plan      Co-evaluation               AM-PAC PT "6 Clicks" Mobility   Outcome Measure  Help needed turning from your back to your side while in a flat bed without using bedrails?: None Help needed moving from lying on your back to sitting on the side of a flat bed without using bedrails?: None Help needed moving to and from a bed to a chair (including a wheelchair)?: None Help needed standing up from a chair using your arms (e.g., wheelchair or bedside chair)?: None Help needed to walk in hospital room?: None Help needed climbing 3-5 steps with a railing? : A Little 6 Click Score: 23    End of Session   Activity Tolerance: Treatment limited secondary to medical complications (Comment) (nausea) Patient left: in bed;with call bell/phone within reach Nurse Communication: Mobility status PT Visit Diagnosis: Other abnormalities of gait and mobility (R26.89);BPPV;Dizziness and giddiness (R42) BPPV - Right/Left : Right     Time: 1610-9604 PT Time Calculation (min) (ACUTE ONLY): 17 min  Charges:    $Canalith Rep Proc: 8-22 mins PT General Charges $$ ACUTE PT VISIT: 1 Visit                      Gayle Kava, PT Acute Rehabilitation Services  Office [phone]    Vincent [name] 07/05/2023, 9:10 AM

## 2023-07-05 NOTE — Plan of Care (Signed)
  Problem: Education: Goal: Knowledge of General Education information will improve Description: Including pain rating scale, medication(s)/side effects and non-pharmacologic comfort measures Outcome: Progressing   Problem: Health Behavior/Discharge Planning: Goal: Ability to manage health-related needs will improve Outcome: Progressing   Problem: Clinical Measurements: Goal: Ability to maintain clinical measurements within normal limits will improve Outcome: Progressing Goal: Will remain free from infection Outcome: Progressing Goal: Diagnostic test results will improve Outcome: Progressing Goal: Respiratory complications will improve Outcome: Progressing Goal: Cardiovascular complication will be avoided Outcome: Progressing   Problem: Activity: Goal: Risk for activity intolerance will decrease Outcome: Progressing   Problem: Nutrition: Goal: Adequate nutrition will be maintained Outcome: Progressing   Problem: Coping: Goal: Level of anxiety will decrease Outcome: Progressing   Problem: Elimination: Goal: Will not experience complications related to bowel motility Outcome: Progressing Goal: Will not experience complications related to urinary retention Outcome: Progressing   Problem: Pain Managment: Goal: General experience of comfort will improve and/or be controlled Outcome: Progressing   Problem: Safety: Goal: Ability to remain free from injury will improve Outcome: Progressing   Problem: Skin Integrity: Goal: Risk for impaired skin integrity will decrease Outcome: Progressing   Problem: Activity: Goal: Ability to tolerate increased activity will improve Outcome: Progressing   Problem: Clinical Measurements: Goal: Ability to maintain a body temperature in the normal range will improve Outcome: Progressing   Problem: Respiratory: Goal: Ability to maintain adequate ventilation will improve Outcome: Progressing Goal: Ability to maintain a clear airway  will improve Outcome: Progressing   Problem: Education: Goal: Knowledge of risk factors and measures for prevention of condition will improve Outcome: Progressing   Problem: Coping: Goal: Psychosocial and spiritual needs will be supported Outcome: Progressing   Problem: Respiratory: Goal: Will maintain a patent airway Outcome: Progressing Goal: Complications related to the disease process, condition or treatment will be avoided or minimized Outcome: Progressing   Problem: Education: Goal: Ability to describe self-care measures that may prevent or decrease complications (Diabetes Survival Skills Education) will improve Outcome: Progressing Goal: Individualized Educational Video(s) Outcome: Progressing   Problem: Coping: Goal: Ability to adjust to condition or change in health will improve Outcome: Progressing   Problem: Fluid Volume: Goal: Ability to maintain a balanced intake and output will improve Outcome: Progressing   Problem: Health Behavior/Discharge Planning: Goal: Ability to identify and utilize available resources and services will improve Outcome: Progressing Goal: Ability to manage health-related needs will improve Outcome: Progressing   Problem: Metabolic: Goal: Ability to maintain appropriate glucose levels will improve Outcome: Progressing   Problem: Nutritional: Goal: Maintenance of adequate nutrition will improve Outcome: Progressing Goal: Progress toward achieving an optimal weight will improve Outcome: Progressing   Problem: Skin Integrity: Goal: Risk for impaired skin integrity will decrease Outcome: Progressing   Problem: Tissue Perfusion: Goal: Adequacy of tissue perfusion will improve Outcome: Progressing

## 2023-07-06 IMAGING — CT CT CHEST HIGH RESOLUTION
2 of 7 series · 14 of 36 positions shown, 17 images · non-contrast
Comparison: Chest CT 03/13/2021.

CLINICAL DATA: 65-year-old male with history of shortness of breath
for the past 2 months following COVID infection.



[Series 4: high resolution retro · axial · 0.92mm/px · z∈[-102,+186]mm · 11 of 347 slices shown, 14 images]
[im 29/347  mediastinal]
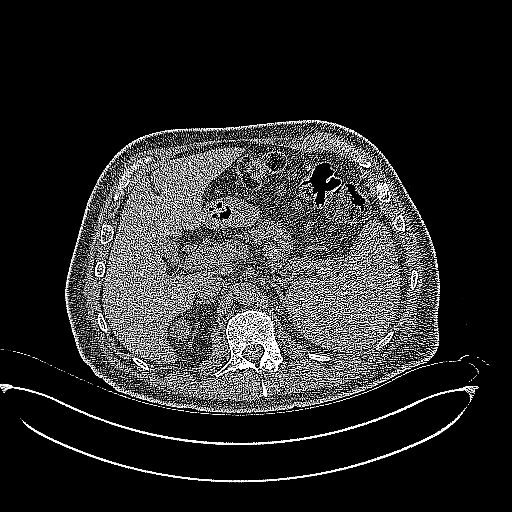
[im 29/347  lung]
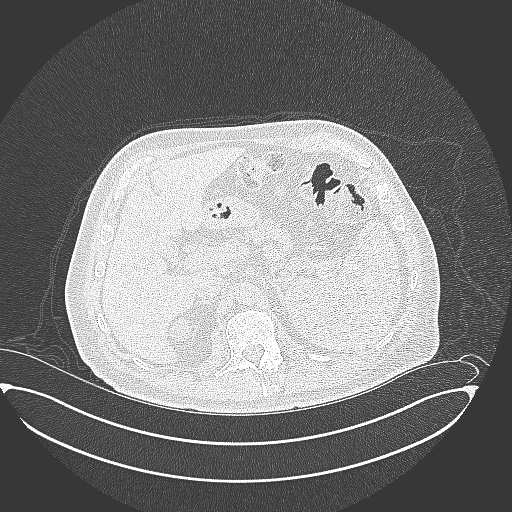
[im 58/347  lung]
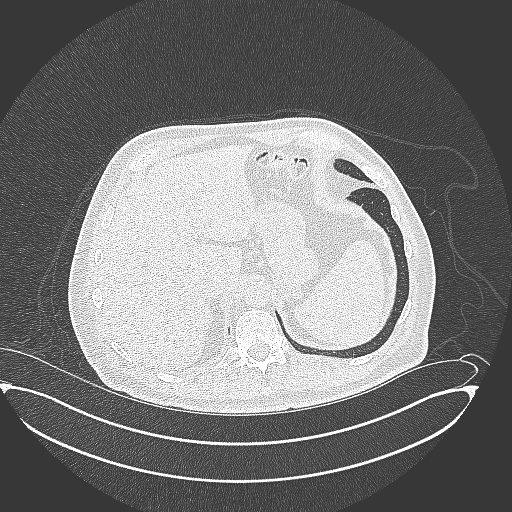
[im 87/347  lung]
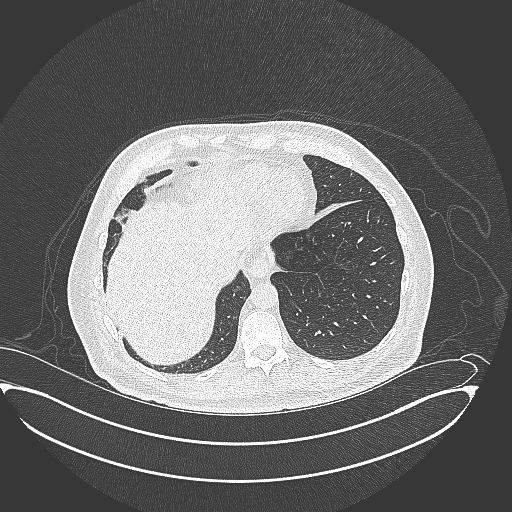
[im 116/347  lung]
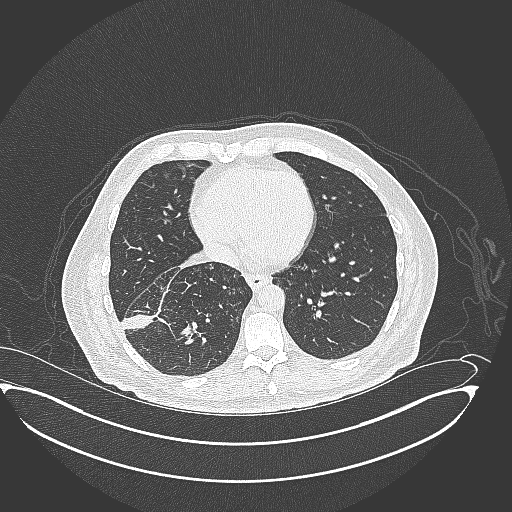
[im 145/347  mediastinal]
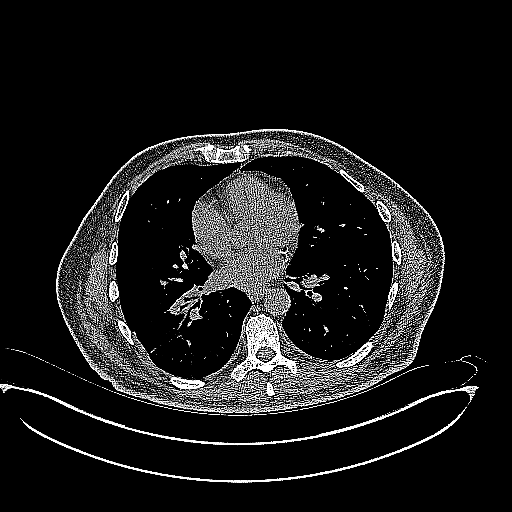
[im 145/347  lung]
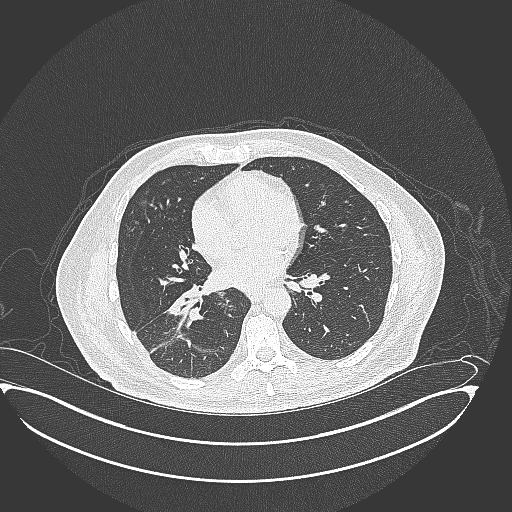
[im 174/347  lung]
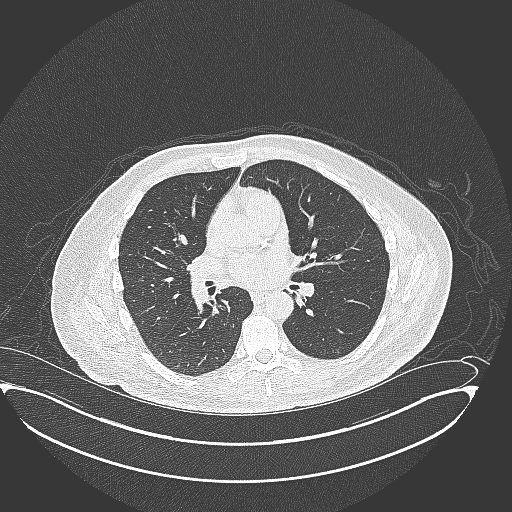
[im 202/347  lung]
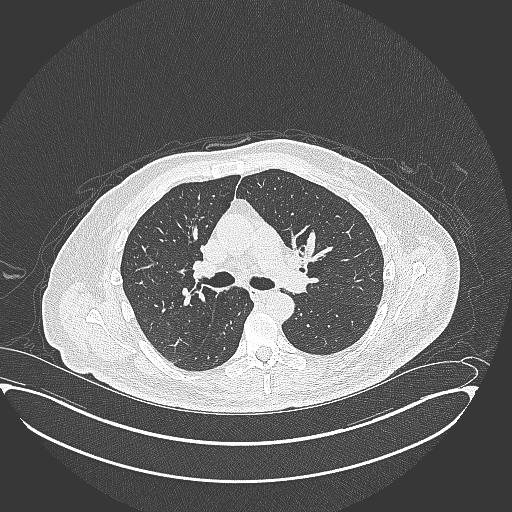
[im 231/347  lung]
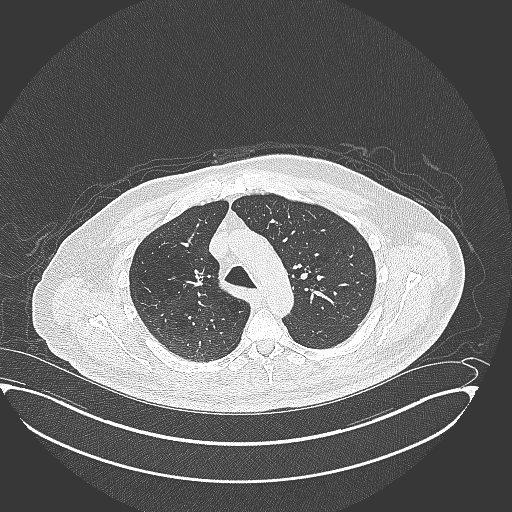
[im 260/347  mediastinal]
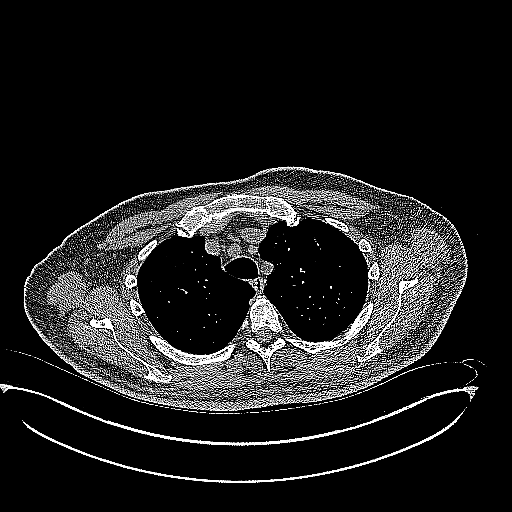
[im 260/347  lung]
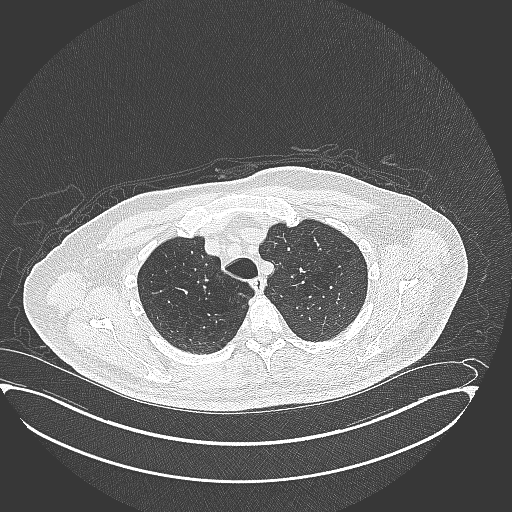
[im 289/347  lung]
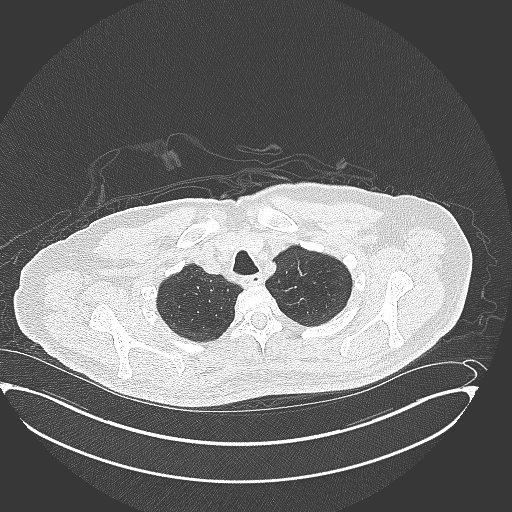
[im 318/347  lung]
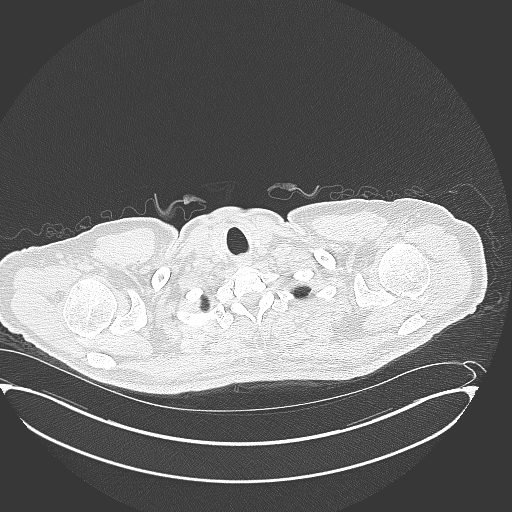

[Series 7: coronal · coronal · 0.74mm/px · 3 of 100 slices shown]
[im 20/100  lung]
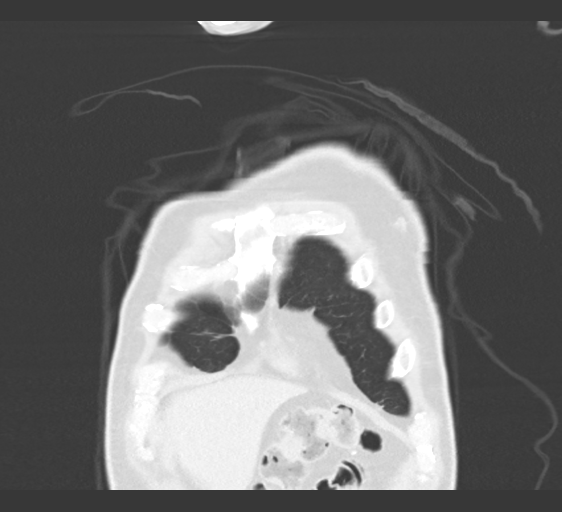
[im 40/100  lung]
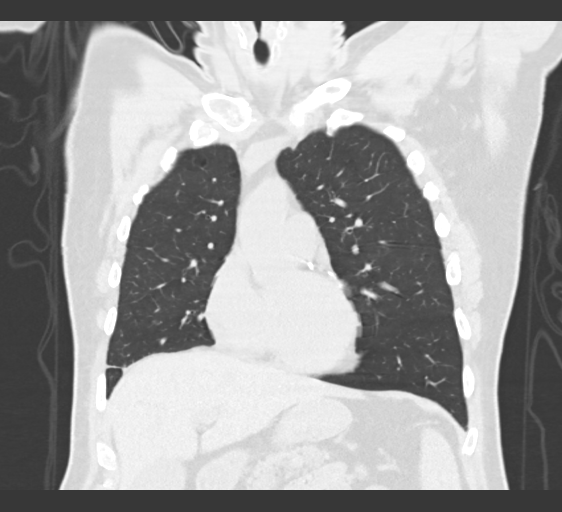
[im 60/100  lung]
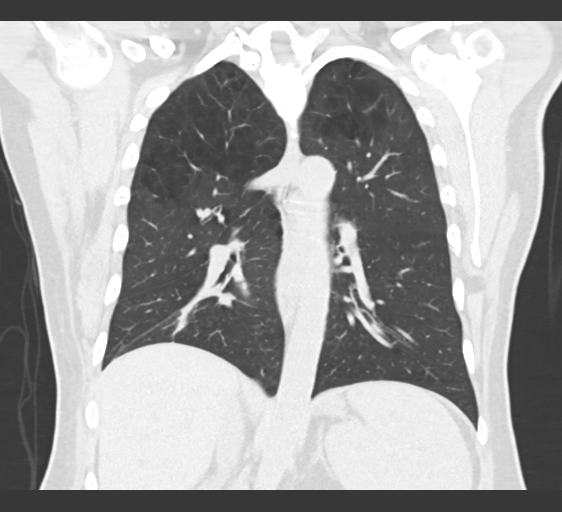

[14 of 36 positions shown; findings below may reference images not displayed]

FINDINGS: Cardiovascular: Heart size is normal. There is no significant
pericardial fluid, thickening or pericardial calcification. There is
aortic atherosclerosis, as well as atherosclerosis of the great
vessels of the mediastinum and the coronary arteries, including
calcified atherosclerotic plaque in the left main, left anterior
descending, left circumflex and right coronary arteries.

Mediastinum/Nodes: No pathologically enlarged mediastinal or hilar
lymph nodes. Please note that accurate exclusion of hilar adenopathy
is limited on noncontrast CT scans. Esophagus is unremarkable in
appearance. No axillary lymphadenopathy.

Lungs/Pleura: Previously noted area of airspace consolidation with
central cavitation in the right lower lobe has nearly completely
resolved, as has the widespread areas of peribronchovascular
ground-glass attenuation and micro nodularity seen on the prior
study. In the right lower lobe there is now an area of linear
architectural distortion, most compatible with an area of chronic
post infectious or inflammatory scarring. High-resolution images
demonstrate minimal residual patchy ground-glass attenuation,
predominantly in the right mid to lower lung. No generalized areas
of ground-glass attenuation, septal thickening, subpleural
reticulation or honeycombing are noted to suggest the presence of
interstitial lung disease. There is diffuse bronchial wall
thickening with moderate centrilobular and mild paraseptal
emphysema. Inspiratory and expiratory imaging is unremarkable. No
definite suspicious appearing pulmonary nodules or masses are noted.

Upper Abdomen: Aortic atherosclerosis. Spleen is incompletely
imaged, but appears likely enlarged measuring at least 12.7 x 8.7 cm
on axial images.

Musculoskeletal: There are no aggressive appearing lytic or blastic
lesions noted in the visualized portions of the skeleton.
IMPRESSION: 1. No findings to suggest interstitial lung disease.
2. Resolution of cavitary pneumonia in the right lower lobe with an
area of post infectious scarring on today's study. A few patchy
areas of peribronchovascular ground-glass attenuation remain in the
right lower lobe, likely residual areas of post infectious
inflammation.
3. Aortic atherosclerosis, in addition to left main and three-vessel
coronary artery disease. Please note that although the presence of
coronary artery calcium documents the presence of coronary artery
disease, the severity of this disease and any potential stenosis
cannot be assessed on this non-gated CT examination. Assessment for
potential risk factor modification, dietary therapy or pharmacologic
therapy may be warranted, if clinically indicated.
4. Probable splenomegaly.

Aortic Atherosclerosis (C2J9E-LTW.W).

## 2023-07-07 NOTE — Telephone Encounter (Signed)
 Ct has been scheduled and LVM for Pt to be made aware. Will send reminder as well.

## 2023-07-07 NOTE — Telephone Encounter (Signed)
CT chest has been ordered.   °

## 2023-08-06 ENCOUNTER — Ambulatory Visit (HOSPITAL_COMMUNITY)
Admission: RE | Admit: 2023-08-06 | Discharge: 2023-08-06 | Disposition: A | Discharge status: Home / Self Care | Payer: PRIVATE HEALTH INSURANCE | Source: Ambulatory Visit | Attending: Pulmonary Disease | Admitting: Pulmonary Disease

## 2023-08-06 DIAGNOSIS — J984 Other disorders of lung: Secondary | ICD-10-CM | POA: Insufficient documentation

## 2023-08-06 DIAGNOSIS — J189 Pneumonia, unspecified organism: Secondary | ICD-10-CM | POA: Diagnosis present

## 2023-08-20 ENCOUNTER — Other Ambulatory Visit (HOSPITAL_COMMUNITY): Payer: Self-pay

## 2023-08-21 ENCOUNTER — Other Ambulatory Visit (HOSPITAL_COMMUNITY): Payer: Self-pay

## 2023-10-01 ENCOUNTER — Other Ambulatory Visit: Payer: Self-pay | Admitting: Neurological Surgery

## 2023-10-01 DIAGNOSIS — S32009K Unspecified fracture of unspecified lumbar vertebra, subsequent encounter for fracture with nonunion: Secondary | ICD-10-CM

## 2023-10-08 ENCOUNTER — Inpatient Hospital Stay
Admission: RE | Admit: 2023-10-08 | Discharge: 2023-10-08 | Discharge status: Home / Self Care | Source: Ambulatory Visit | Attending: Neurological Surgery | Admitting: Neurological Surgery

## 2023-10-08 DIAGNOSIS — S32009K Unspecified fracture of unspecified lumbar vertebra, subsequent encounter for fracture with nonunion: Secondary | ICD-10-CM

## 2023-10-23 ENCOUNTER — Ambulatory Visit: Payer: PRIVATE HEALTH INSURANCE | Admitting: Pulmonary Disease

## 2023-10-23 ENCOUNTER — Encounter: Payer: Self-pay | Admitting: Pulmonary Disease

## 2023-10-23 VITALS — BP 122/72 | HR 91 | Temp 99.4°F | Ht 71.0 in | Wt 147.6 lb

## 2023-10-23 DIAGNOSIS — J984 Other disorders of lung: Secondary | ICD-10-CM

## 2023-10-23 DIAGNOSIS — J189 Pneumonia, unspecified organism: Secondary | ICD-10-CM

## 2023-10-23 DIAGNOSIS — J850 Gangrene and necrosis of lung: Secondary | ICD-10-CM | POA: Diagnosis not present

## 2023-10-23 NOTE — Progress Notes (Signed)
 Vincent [name]    [id number]    April 22, 1955  Primary Care Physician:Elkins, Tanda Mae, MD  Referring Physician: Loring Tanda Mae, MD [address] [place],  [place] [address]  Chief complaint: Follow-up for recurrent cavitary pneumonia  HPI: 68 y.o.  with history of diabetes Reports 2 episodes of COVID infection.  The last one was in October 2022. Has had shortness of breath and coughing since then.  He was hospitalized in December 2022 with right lower lobe cavitary pneumonia.  Underwent bronchoscopy with BAL and transbronchial biopsies on 03/15/2021 with negative results.  He was treated with Augmentin , doxycycline  with improvement in CT imaging.  Interim history: The patient, with recurrent cavitary pneumonia, presents for follow-up of their condition.  Recurrent cavitary pneumonia - Initial diagnosis in 2022 with right lower lobe involvement and Streptococcus infection - Bronchoscopy performed at initial diagnosis - Condition improved with treatment after initial episode - Hospitalized in April 2025 for pneumonia in the right upper lobe - Treated with Augmentin  for four weeks in April 2025, resulting in resolution of infection  Nocturnal respiratory symptoms - Wakes up in the middle of the night with gurgling and coughing - Symptoms similar to those experienced prior to the most recent pneumonia episode  Covid-19 infection - Tested positive for COVID-19 during recent hospitalization - Suspects infection was present prior to admission, with similar symptoms before hospital visit - No treatment for COVID-19 during hospitalization  Dysphagia and medication administration - Difficulty swallowing large Augmentin  pills, requiring them to be broken into four pieces - Currently prescribed Augmentin  for dental issues, with perceived benefit in preventing pneumonia recurrence  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  No feather pillows or comforters Smoking history: Never smoker Travel history: No significant travel history Relevant family history: No family history of lung disease  Outpatient Encounter Medications as of 10/23/2023  Medication Sig   amoxicillin -clavulanate (AUGMENTIN ) 875-125 MG tablet Take 1 tablet by mouth 2 (two) times daily.   blood glucose meter kit and supplies Dispense based on patient and insurance preference. Use up to four times daily as directed. (FOR ICD-10 E11.8).   diclofenac  Sodium (VOLTAREN ) 1 % GEL Apply 1 application topically daily as needed (back pain).   gabapentin  (NEURONTIN ) 300 MG capsule Take by mouth.   meclizine  (ANTIVERT ) 25 MG tablet Take 1 tablet (25 mg total) by mouth 3 (three) times daily as needed for dizziness or nausea.   metFORMIN  (GLUCOPHAGE -XR) 500 MG 24 hr tablet Take 500 mg by mouth 2 (two) times daily.   ondansetron  (ZOFRAN ) 8 MG tablet Take 1 tablet (8 mg total) by mouth every 8 (eight) hours as needed for vomiting or nausea.   oxyCODONE  (ROXICODONE ) 15 MG immediate release tablet Take 1 tablet (15 mg total) by mouth every 6 (six) hours as needed for pain.   tamsulosin  (FLOMAX ) 0.4 MG CAPS capsule Take 0.4 mg by mouth daily after supper.   Lactobacillus (ACIDOPHILUS) CAPS capsule Take 1 capsule by mouth 3 (three) times daily with meals. (Patient not taking: Reported on 10/23/2023)   loratadine  (CLARITIN ) 10 MG tablet Take 1 tablet (10 mg total) by  mouth daily. (Patient not taking: Reported on 10/23/2023)   No facility-administered encounter medications on file as of 10/23/2023.    Physical Exam: Blood pressure 122/72, pulse 91, temperature 99.4 F (37.4  C), temperature source Oral, height 5' 11 (1.803 m), weight 147 lb 9.6 oz (67 kg), SpO2 98%. Gen:      No acute distress HEENT:  EOMI, sclera anicteric Neck:     No masses; no thyromegaly Lungs:    Clear to auscultation bilaterally; normal respiratory effort CV:         Regular rate and rhythm; no murmurs Abd:      + bowel sounds; soft, non-tender; no palpable masses, no distension Ext:    No edema; adequate peripheral perfusion Neuro: alert and oriented x 3 Psych: normal mood and affect   Data Reviewed: Imaging: CT chest 03/13/2021-right lower lobe consolidation with cavitation, additional patchy groundglass opacities in bilateral lungs.  CT 05/25/2021-resolution of cavitary pneumonia in the right lower lobe with mild peribronchovascular groundglass attenuation CT chest 06/30/2023-partial right upper lobe consolidation with masslike consolidation in the right lower lobe consistent with necrotic pneumonia CT chest 08/06/2023-partial resolution of pneumonia with cavitation in the right lung  PFTs:  Labs: Bronchoscopy 03/15/2021 BAL with 96% neutrophils, microbiology with Streptococcus intermedius Transbronchial biopsy showed necrotizing infection  Assessment:  Recurrent cavitary pneumonia, right lung Recurrent cavitary pneumonia in the right lung, initially diagnosed in 2022 in the right lower lobe and again in April 2025 in the right upper lobe. Treated with Augmentin  for four weeks, resulting in improvement. CT scan post-hospitalization shows improvement but still a cavity present. Reports intermittent gurgling and productive cough, indicating possible ongoing or recurrent infection. Declined esophagram despite previous recommendation due to recent extensive medical evaluations. - Order CT scan in December to monitor cavity resolution and rule out malignancy. - Advise to contact healthcare provider if symptoms worsen or recur, potentially starting antibiotics sooner. - Schedule follow-up  appointment in six months.  Plan/Recommendations: Follow-up CT in 6 months  Lonna Coder MD [facility] 10/23/2023, 4:10 PM  CC: Loring Tanda Mae, *

## 2023-10-23 NOTE — Patient Instructions (Signed)
 VISIT SUMMARY:  You came in today for a follow-up on your recurrent cavitary pneumonia and other related symptoms. We discussed your recent history, including your hospitalization in April 2025, and your current symptoms and treatment plan.  YOUR PLAN:  -RECURRENT CAVITARY PNEUMONIA, RIGHT LUNG: Recurrent cavitary pneumonia is a repeated lung infection that leads to the formation of cavities in the lung tissue. You were initially diagnosed in 2022 and again in April 2025. You were treated with Augmentin , which helped improve your condition. However, a recent CT scan still shows a cavity in your lung. We will order another CT scan in December to monitor the cavity and rule out any serious conditions like cancer. If your symptoms worsen or come back, please contact us  immediately so we can consider starting antibiotics sooner. Make sure to fill your Augmentin  prescription for your dental issues and any potential respiratory symptoms. We will see you again in six months.  -NOCTURNAL RESPIRATORY SYMPTOMS: You have been waking up at night with gurgling and coughing, which are similar to the symptoms you had before your last pneumonia episode. This could indicate ongoing or recurrent infection. Please monitor these symptoms and let us  know if they get worse.  -COVID-19 INFECTION: You tested positive for COVID-19 during your recent hospitalization. Although you did not receive treatment for it at the time, it is important to monitor for any lingering symptoms and take precautions to prevent spreading the virus.  -DYSPHAGIA AND MEDICATION ADMINISTRATION: You have difficulty swallowing large pills, which has made taking your Augmentin  challenging. Continue breaking the pills into smaller pieces to make them easier to swallow. This is important to ensure you get the full benefit of the medication.  -FUNCTIONAL LIMITATIONS DUE TO BACK SURGERY: Your back surgery has limited your ability to care for your ducks, but  you have made arrangements to keep them in a sufficient space until you recover further. Continue to follow any physical therapy or recovery plans to improve your mobility.  INSTRUCTIONS:  Please schedule a CT scan for December to monitor the cavity in your lung. Contact us  immediately if your symptoms worsen or recur. Ensure your Augmentin  prescription is filled and take it as directed. We will see you again in six months for a follow-up appointment.

## 2023-11-13 ENCOUNTER — Other Ambulatory Visit (HOSPITAL_COMMUNITY): Payer: Self-pay | Admitting: Neurological Surgery

## 2023-11-13 DIAGNOSIS — S32009K Unspecified fracture of unspecified lumbar vertebra, subsequent encounter for fracture with nonunion: Secondary | ICD-10-CM

## 2024-02-09 ENCOUNTER — Ambulatory Visit
Admission: RE | Admit: 2024-02-09 | Discharge: 2024-02-09 | Disposition: A | Discharge status: Home / Self Care | Source: Ambulatory Visit | Attending: Pulmonary Disease | Admitting: Pulmonary Disease

## 2024-02-09 DIAGNOSIS — J850 Gangrene and necrosis of lung: Secondary | ICD-10-CM

## 2024-02-09 DIAGNOSIS — J189 Pneumonia, unspecified organism: Secondary | ICD-10-CM

## 2024-02-13 ENCOUNTER — Ambulatory Visit: Payer: Self-pay | Admitting: Pulmonary Disease

## 2024-02-13 DIAGNOSIS — K209 Esophagitis, unspecified without bleeding: Secondary | ICD-10-CM

## 2024-02-23 ENCOUNTER — Ambulatory Visit: Payer: PRIVATE HEALTH INSURANCE | Admitting: Pulmonary Disease

## 2024-02-23 ENCOUNTER — Encounter: Payer: Self-pay | Admitting: Pulmonary Disease

## 2024-02-23 VITALS — BP 118/74 | HR 96 | Temp 98.2°F | Ht 70.0 in | Wt 174.0 lb

## 2024-02-23 DIAGNOSIS — K047 Periapical abscess without sinus: Secondary | ICD-10-CM

## 2024-02-23 DIAGNOSIS — J984 Other disorders of lung: Secondary | ICD-10-CM

## 2024-02-23 DIAGNOSIS — J439 Emphysema, unspecified: Secondary | ICD-10-CM

## 2024-02-23 DIAGNOSIS — R933 Abnormal findings on diagnostic imaging of other parts of digestive tract: Secondary | ICD-10-CM | POA: Diagnosis not present

## 2024-02-23 DIAGNOSIS — K209 Esophagitis, unspecified without bleeding: Secondary | ICD-10-CM

## 2024-02-23 DIAGNOSIS — Z8701 Personal history of pneumonia (recurrent): Secondary | ICD-10-CM

## 2024-02-23 MED ORDER — AMOXICILLIN-POT CLAVULANATE 875-125 MG PO TABS: 1.0000 | ORAL_TABLET | Freq: Two times a day (BID) | ORAL | 0 refills | Status: AC

## 2024-02-23 NOTE — Patient Instructions (Signed)
  VISIT SUMMARY: You came in for a follow-up on your pulmonary conditions, including cavitary pneumonia and emphysema. We discussed your recent CT scan results, respiratory symptoms, and concerns about dental infections.  YOUR PLAN: RIGHT UPPER LOBE PULMONARY SCARRING POST-CAVITARY PNEUMONIA: Your CT scan shows significant improvement in your right upper lobe cavitary pneumonia with some residual scarring, which is expected after an infection. -Continue follow-up as needed.  EMPHYSEMA (HISTORY OF TOBACCO USE): Your CT scan reveals emphysema consistent with your history of extensive tobacco use. You have occasional wheezing and coughing but no significant respiratory symptoms at this time. -Consider a lung function test in the future to assess for COPD. -Monitor your respiratory symptoms and consider using inhalers if symptoms worsen.  ESOPHAGEAL WALL THICKENING (POSSIBLE ESOPHAGITIS OR REFLUX): Your CT scan shows esophageal wall thickening, which could indicate esophagitis or acid reflux. You have a history of acid reflux but no current symptoms. -You are referred to a gastroenterologist for further evaluation.  RECURRENT DENTAL INFECTION: You have recurrent dental infections that have required antibiotics in the past. -You are prescribed a 30-day supply of amoxicillin  for dental infections. Use antibiotics judiciously to prevent resistance.                                 Contains text generated by [employer].

## 2024-02-23 NOTE — Progress Notes (Signed)
 Vincent [name]    [id number]    December 29, 1955  Primary Care Physician:Elkins, Tanda Mae, MD  Referring Physician: Loring Tanda Mae, MD [address] [place],  [place] [address]  Chief complaint: Follow-up for recurrent cavitary pneumonia  HPI: 68 y.o.  with history of diabetes Reports 2 episodes of COVID infection.  The last one was in October 2022. Has had shortness of breath and coughing since then.  He was hospitalized in December 2022 with right lower lobe cavitary pneumonia.  Underwent bronchoscopy with BAL and transbronchial biopsies on 03/15/2021 with negative results.  He was treated with Augmentin , doxycycline  with improvement in CT imaging.  Interim history: The patient, with recurrent cavitary pneumonia, presents for follow-up of their condition.  Recurrent cavitary pneumonia - Initial diagnosis in 2022 with right lower lobe involvement and Streptococcus infection - Bronchoscopy performed at initial diagnosis - Condition improved with treatment after initial episode - Hospitalized in April 2025 for pneumonia in the right upper lobe - Treated with Augmentin  for four weeks in April 2025, resulting in resolution of infection  Nocturnal respiratory symptoms - Wakes up in the middle of the night with gurgling and coughing - Symptoms similar to those experienced prior to the most recent pneumonia episode  Covid-19 infection - Tested positive for COVID-19 during recent hospitalization - Suspects infection was present prior to admission, with similar symptoms before hospital visit - No treatment for COVID-19 during hospitalization  Dysphagia and medication administration - Difficulty swallowing large Augmentin  pills, requiring them to be broken into four pieces - Currently prescribed Augmentin  for dental issues, with perceived benefit in preventing pneumonia recurrence  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  No feather pillows or comforters Smoking history: 2-3 Ppd x 25 years, Quit in 1996 Travel history: No significant travel history Relevant family history: No family history of lung disease  Outpatient Encounter Medications as of 02/23/2024  Medication Sig   diclofenac  Sodium (VOLTAREN ) 1 % GEL Apply 1 application topically daily as needed (back pain).   gabapentin  (NEURONTIN ) 300 MG capsule Take by mouth.   meclizine  (ANTIVERT ) 25 MG tablet Take 1 tablet (25 mg total) by mouth 3 (three) times daily as needed for dizziness or nausea.   metFORMIN  (GLUCOPHAGE -XR) 500 MG 24 hr tablet Take 500 mg by mouth 2 (two) times daily.   ondansetron  (ZOFRAN ) 8 MG tablet Take 1 tablet (8 mg total) by mouth every 8 (eight) hours as needed for vomiting or nausea.   oxyCODONE  (ROXICODONE ) 15 MG immediate release tablet Take 1 tablet (15 mg total) by mouth every 6 (six) hours as needed for pain.   tamsulosin  (FLOMAX ) 0.4 MG CAPS capsule Take 0.4 mg by mouth daily after supper.   amoxicillin -clavulanate (AUGMENTIN ) 875-125 MG tablet Take 1 tablet by mouth 2 (two) times daily. (Patient not taking: Reported on 02/23/2024)   blood glucose meter kit and supplies Dispense based on patient and insurance preference. Use up to four times daily as directed. (FOR ICD-10 E11.8).   Lactobacillus (ACIDOPHILUS) CAPS capsule Take 1 capsule by mouth 3 (three) times daily with meals. (Patient not taking: Reported on 02/23/2024)  loratadine  (CLARITIN ) 10 MG tablet Take 1 tablet (10 mg total) by mouth daily. (Patient not taking: Reported on 02/23/2024)   No facility-administered encounter medications on file as of 02/23/2024.    Physical  Exam: Blood pressure 122/72, pulse 91, temperature 99.4 F (37.4 C), temperature source Oral, height 5' 11 (1.803 m), weight 147 lb 9.6 oz (67 kg), SpO2 98%. Gen:      No acute distress HEENT:  EOMI, sclera anicteric Neck:     No masses; no thyromegaly Lungs:    Clear to auscultation bilaterally; normal respiratory effort CV:         Regular rate and rhythm; no murmurs Abd:      + bowel sounds; soft, non-tender; no palpable masses, no distension Ext:    No edema; adequate peripheral perfusion Neuro: alert and oriented x 3 Psych: normal mood and affect   Data Reviewed: Imaging: CT chest 03/13/2021-right lower lobe consolidation with cavitation, additional patchy groundglass opacities in bilateral lungs.  CT 05/25/2021-resolution of cavitary pneumonia in the right lower lobe with mild peribronchovascular groundglass attenuation CT chest 06/30/2023-partial right upper lobe consolidation with masslike consolidation in the right lower lobe consistent with necrotic pneumonia CT chest 08/06/2023-partial resolution of pneumonia with cavitation in the right lung  PFTs:  Labs: Bronchoscopy 03/15/2021 BAL with 96% neutrophils, microbiology with Streptococcus intermedius Transbronchial biopsy showed necrotizing infection  Assessment:  Recurrent cavitary pneumonia, right lung Recurrent cavitary pneumonia in the right lung, initially diagnosed in 2022 in the right lower lobe and again in April 2025 in the right upper lobe. Treated with Augmentin  for four weeks, resulting in improvement. CT scan post-hospitalization shows improvement but still a cavity present. Reports intermittent gurgling and productive cough, indicating possible ongoing or recurrent infection. Declined esophagram despite previous recommendation due to recent extensive medical evaluations. - Order CT scan in December to monitor cavity resolution and rule out malignancy. - Advise to contact healthcare provider if symptoms worsen or  recur, potentially starting antibiotics sooner. - Schedule follow-up appointment in six months.  Plan/Recommendations: Follow-up CT in 6 months  Lonna Coder MD [facility] 02/23/2024, 3:28 PM  CC: Loring Tanda Mae, *

## 2024-02-23 NOTE — Progress Notes (Signed)
 Vincent [name]    [id number]    1955/09/25  Primary Care Physician:Elkins, Tanda Mae, MD  Referring Physician: Loring Tanda Mae, MD [address] [place],  [place] [address]  Chief complaint: Follow-up for recurrent cavitary pneumonia, emphysema  HPI: 68 y.o.  with history of diabetes, emphysema, recurrent pneumonia Reports 2 episodes of COVID infection.  The last one was in October 2022. Has had shortness of breath and coughing since then.  He was hospitalized in December 2022 with right lower lobe cavitary pneumonia and Streptococcus infection.  Underwent bronchoscopy with BAL and transbronchial biopsies on 03/15/2021 with negative results.  He was treated with Augmentin , doxycycline  with improvement in CT imaging.  Hospitalized again in April 2025 with right upper lobe cavitary pneumonia treated with Augmentin  for 4 weeks.  Interim history: Discussed the use of AI scribe software for clinical note transcription with the patient, who gave verbal consent to proceed.  History of Present Illness Vincent [name] is a 68 year old male with cavitary pneumonia and emphysema who presents for follow-up of his pulmonary conditions.  Cavitary pneumonia - Recurrent right-sided cavitary pneumonia with episodes in 2022 and 2025 - No current dyspnea - Breathing is currently stable  Emphysema and chronic respiratory symptoms - Emphysema throughout both lungs - History of tobacco use: smoked two to three packs per day from age 31 until 21, quit 30 years ago - Occasional brief episodes of wheeze and cough, resolving within a few days - Improvement in respiratory symptoms with amoxicillin  during infections  Respiratory infections and winter exacerbations - Concern for respiratory infections during winter months - Increased cough with sputum production during illness - Belief that earlier administration of amoxicillin  could have prevented last severe illness  Dental  infections and associated respiratory concerns - Dental infections with oral swelling - Received amoxicillin  for dental infections - Requesting amoxicillin  to have on hand due to concern for worsening respiratory symptoms during dental infections    Relevant pulmonary history: Pets: Dogs, raises Mallard ducks Occupation: Plumber Exposures: No mold, hot tub, Jacuzzi.  No feather pillows or comforters Smoking history: Never smoker Travel history: No significant travel history Relevant family history: No family history of lung disease  Outpatient Encounter Medications as of 02/23/2024  Medication Sig   diclofenac  Sodium (VOLTAREN ) 1 % GEL Apply 1 application topically daily as needed (back pain).   gabapentin  (NEURONTIN ) 300 MG capsule Take by mouth.   meclizine  (ANTIVERT ) 25 MG tablet Take 1 tablet (25 mg total) by mouth 3 (three) times daily as needed for dizziness or nausea.   metFORMIN  (GLUCOPHAGE -XR) 500 MG 24 hr tablet Take 500 mg by mouth 2 (two) times daily.   ondansetron  (ZOFRAN ) 8 MG tablet Take 1 tablet (8 mg total) by mouth every 8 (eight) hours as needed for vomiting or nausea.   oxyCODONE  (ROXICODONE ) 15 MG immediate release tablet Take 1 tablet (15 mg total) by mouth every 6 (six) hours as needed for pain.   tamsulosin  (FLOMAX ) 0.4 MG CAPS capsule Take 0.4 mg by mouth daily after supper.   amoxicillin -clavulanate (AUGMENTIN ) 875-125 MG tablet Take 1 tablet by mouth 2 (two) times daily. (Patient not taking: Reported on 02/23/2024)   blood glucose meter kit and supplies Dispense based on patient and insurance preference. Use up to four times daily as directed. (FOR ICD-10 E11.8).   Lactobacillus (ACIDOPHILUS) CAPS capsule Take 1 capsule by mouth 3 (three) times daily with meals. (Patient not taking:  Reported on 02/23/2024)   loratadine  (CLARITIN ) 10 MG tablet Take 1 tablet (10 mg total) by mouth daily. (Patient not taking: Reported on 02/23/2024)   No facility-administered encounter  medications on file as of 02/23/2024.    Vitals:   02/23/24 1520  BP: 118/74  Pulse: 96  Temp: 98.2 F (36.8 C)  Height: 5' 10 (1.778 m)  Weight: 174 lb (78.9 kg)  SpO2: 96%  TempSrc: Oral  BMI (Calculated): 24.97    Physical Exam GEN: No acute distress. CV: Regular rate and rhythm, no murmurs. LUNGS: Clear to auscultation bilaterally, normal respiratory effort. SKIN JOINTS: Warm and dry, no rash.    Data Reviewed: Imaging: CT chest 03/13/2021-right lower lobe consolidation with cavitation, additional patchy groundglass opacities in bilateral lungs.  CT 05/25/2021-resolution of cavitary pneumonia in the right lower lobe with mild peribronchovascular groundglass attenuation CT chest 06/30/2023-partial right upper lobe consolidation with masslike consolidation in the right lower lobe consistent with necrotic pneumonia CT chest 08/06/2023-partial resolution of pneumonia with cavitation in the right lung CT chest 02/09/2024-resolution of right upper lobe cavitary consolidation with residual bandlike scarring, moderate emphysema.  Thickening of distal esophagus. I reviewed the images personally  PFTs:  Labs: Bronchoscopy 03/15/2021 BAL with 96% neutrophils, microbiology with Streptococcus intermedius Transbronchial biopsy showed necrotizing infection  Assessment and Plan Assessment & Plan Right upper lobe pulmonary scarring post-cavitary pneumonia (follow-up) CT scan shows significant improvement in right upper lobe cavitary pneumonia with residual scarring, which is expected post-infection.  He has a previous episode of right lower lobe cavitary pneumonia in 2022 - Continue follow-up as needed.  Emphysema (history of tobacco use) CT scan reveals emphysema consistent with extensive tobacco use. He quit smoking 30 years ago. Reports occasional wheezing and coughing, but no significant respiratory symptoms. Discussed potential for COPD and the role of lung function tests in  diagnosis. He is hesitant to undergo testing due to current health priorities including back pain for which he requires surgery. - Will consider lung function test in the future to assess for COPD. - Monitor respiratory symptoms and consider inhalers if symptoms worsen.  Esophageal wall thickening (possible esophagitis or reflux) CT scan shows esophageal wall thickening, possibly indicating esophagitis or acid reflux. He has a history of acid reflux but reports no current symptoms. Discussed the need for gastroenterology evaluation, but he has other health priorities. - Referred to gastroenterologist for evaluation of esophageal wall thickening.  Recurrent dental infection He reports recurrent dental infections requiring antibiotics. Previously treated with amoxicillin  and Augmentin . Discussed the importance of using antibiotics judiciously to prevent resistance. - Prescribed amoxicillin  30-day supply for dental infections.  Plan/Recommendations: Follow-up in 1 year  I personally spent a total of 40 minutes in the care of the patient today including preparing to see the patient, getting/reviewing separately obtained history, documenting clinical information in the EHR, independently interpreting results, communicating results, and coordinating care.   Lonna Coder MD [facility] 02/23/2024, 3:22 PM  CC: Loring Tanda Mae, *

## 2024-04-12 ENCOUNTER — Encounter (HOSPITAL_COMMUNITY): Payer: Self-pay | Admitting: Orthopedic Surgery

## 2024-04-12 ENCOUNTER — Other Ambulatory Visit: Payer: Self-pay

## 2024-04-12 NOTE — Anesthesia Preprocedure Evaluation (Signed)
 "                                  Anesthesia Evaluation  Patient identified by MRN, date of birth, ID band Patient awake    Reviewed: Allergy & Precautions, H&P , NPO status , Patient's Chart, lab work & pertinent test results  History of Anesthesia Complications Negative for: history of anesthetic complications  Airway Mallampati: II  TM Distance: >3 FB Neck ROM: Full    Dental  (+) Poor Dentition, Dental Advisory Given, Chipped   Pulmonary sleep apnea , neg COPD, Patient abstained from smoking.Not current smoker, former smoker   Pulmonary exam normal breath sounds clear to auscultation       Cardiovascular Exercise Tolerance: Good METShypertension, Pt. on medications (-) CAD and (-) Past MI (-) dysrhythmias  Rhythm:Regular Rate:Normal - Systolic murmurs    Neuro/Psych negative neurological ROS  negative psych ROS   GI/Hepatic negative GI ROS, Neg liver ROS,neg GERD  ,,  Endo/Other  diabetes, Type 2, Oral Hypoglycemic Agents    Renal/GU Renal InsufficiencyRenal disease  negative genitourinary   Musculoskeletal  (+) Arthritis , Osteoarthritis,    Abdominal   Peds  Hematology negative hematology ROS (+)   Anesthesia Other Findings Per PAT note: History includes former smoker (quit 03/25/88), childhood murmur, DM2, CKD, OSA (does not have CPAP), skin cancer (s/p left facial BCC excision, rotation flp 11/03/2019), lumbar surgeries (x2), PNA (RLL cavitary pneumonia due to Streptococcus intermedius 02/2021; recurrent cavitary RUL 06/2023, s/p Augmentin  x 4 weeks), spinal surgery C3-6 ACDF 05/20/2022; L3-4 PLIF 03/05/2023). He reported intraoperative bleeding during his first back surgery around age 69 (see below).    I evaluated him on 10/27/2019 prior to a previous surgery given his reported childhood murmur and intra-operative bleeding history. I did not hear a murmur at that time. Per my previously note, In regards to his intraoperative bleeding history, he  initially reported that he and his mother were free bleeders. However, he denied ever seeing a hematologist or formal diagnosis of a specific bleeding disorder.  To his knowledge, he is never required pre-operative DDVT, PLT, or clotting factors.  When asked where the free bleeder term came from, he said that it was his own term used to describe how his ex-wife explained his bleeding during his first back surgery around age 40 at [facility] by Dr. Elspeth Essex. He said that 5-6 months after his surgery, she told him that Dr. Essex had told her that they almost lost him during surgery because they couldn't find where he was bleeding. He went on to have subsequent surgeries including another back surgery at [facility] around age 85 also by Dr. Essex, left inguinal hernia repair, umbilitcal hernia repair ~ 2006, and I&D of left patella 10/27/16 for infection following knee aspiration without known bleeding issues. He reported recurrent epistaxis as a child, but not real issues as an adult. Denied any other know issues of spontaneously bleeding and also denied any prolonged bleeding with things like finger cuts. When I asked him about bleeding history in his mother, he reported that she had difficult to control bleeding after an MVA and then later was an alcoholic and fell and sustained a scalp laceration that was difficult to stop bleeding. He denied previous referral to a hematologist and denied any formal disorder of a specific bleeding disorder. Except for childhood episodes of epistaxis and intraoperative bleeding with his first back  surgery, he denied any spontaneous bleeding or prolonged bleeding.    He is followed by pulmonologist Dr. Theophilus for recurrent right cavitary PNA. First in 02/2021, then recurrence in 06/2023. He was treated with 4 weeks Augmentin . 02/09/2024 chest CT showed  resolution of RUL consolidation with residual band-like scarring posterior RUL, moderate emphysema/inflammation changes,  non-specific thickening of the distal esophagus (referred to GI). PFTs considered, but he wished to hold off given current back issues so will monitory symptoms and consider inhalers if any worsening. Some dysphagia with medication administration. Has been prescribed Augmentin  or amoxicillin  for dental issues with perceived benefit in preventing pneumonia recurrence. One year follow-up planned.      Reproductive/Obstetrics negative OB ROS                              Anesthesia Physical Anesthesia Plan  ASA: 3  Anesthesia Plan: General   Post-op Pain Management: Minimal or no pain anticipated   Induction: Intravenous  PONV Risk Score and Plan: 2 and Ondansetron , Dexamethasone  and Midazolam   Airway Management Planned: LMA  Additional Equipment: None  Intra-op Plan:   Post-operative Plan: Extubation in OR  Informed Consent: I have reviewed the patients History and Physical, chart, labs and discussed the procedure including the risks, benefits and alternatives for the proposed anesthesia with the patient or authorized representative who has indicated his/her understanding and acceptance.     Dental advisory given  Plan Discussed with: CRNA  Anesthesia Plan Comments: (Discussed risks of anesthesia with patient, including PONV, sore throat, lip/dental/eye damage. Rare risks discussed as well, such as cardiorespiratory and neurological sequelae, and allergic reactions. Discussed the role of CRNA in patient's perioperative care. Patient understands.)        Anesthesia Quick Evaluation  "

## 2024-04-12 NOTE — Progress Notes (Signed)
 PCP - Tanda Eke, MD  Chest x-ray - 06/30/23 EKG - 06/30/23  Fasting Blood Sugar - doesn't check blood sugar   Anesthesia review: Y  Patient verbally denies any shortness of breath, fever, cough and chest pain during phone call   -------------  SDW INSTRUCTIONS given:  Your procedure is scheduled on Thursday, January 22nd.  Report to [facility] Main Entrance A at 0800 A.M., and check in at the Admitting office.  Call this number if you have problems the morning of surgery:  [phone]   Remember:  Do not eat after midnight the night before your surgery  You may drink clear liquids until 0730 the morning of your surgery.   Clear liquids allowed are: Water, Non-Citrus Juices (without pulp), Carbonated Beverages, Clear Tea, Black Coffee Only, and Gatorade    Take these medicines the morning of surgery with A SIP OF WATER  diclofenac  (VOLTAREN ) gabapentin  (NEURONTIN )  famotidine (PEPCID)-if needed  oxyCODONE  (ROXICODONE )-if needed  ** PLEASE check your blood sugar the morning of your surgery when you wake up and every 2 hours until you get to the Short Stay unit.  If your blood sugar is less than 70 mg/dL, you will need to treat for low blood sugar: Do not take insulin . Treat a low blood sugar (less than 70 mg/dL) with  cup of clear juice (cranberry or apple), 4 glucose tablets, OR glucose gel. Recheck blood sugar in 15 minutes after treatment (to make sure it is greater than 70 mg/dL). If your blood sugar is not greater than 70 mg/dL on recheck, call [phone] for further instructions.  As of today all herbal medications, fish oil, and all vitamins.                      Do not wear jewelry, make up, or nail polish            Do not wear lotions, powders, perfumes/colognes, or deodorant.            Do not shave 48 hours prior to surgery.  Men may shave face and neck.            Do not bring valuables to the hospital.            [facility] is not responsible for  any belongings or valuables.  Do NOT Smoke (Tobacco/Vaping) 24 hours prior to your procedure If you use a CPAP at night, you may bring all equipment for your overnight stay.   Contacts, glasses, dentures or bridgework may not be worn into surgery.      For patients admitted to the hospital, discharge time will be determined by your treatment team.   Patients discharged the day of surgery will not be allowed to drive home, and someone needs to stay with them for 24 hours.    Special instructions:   [facility]- Preparing For Surgery  Before surgery, you can play an important role. Because skin is not sterile, your skin needs to be as free of germs as possible. You can reduce the number of germs on your skin by washing with CHG (chlorahexidine gluconate) Soap before surgery.  CHG is an antiseptic cleaner which kills germs and bonds with the skin to continue killing germs even after washing.    Oral Hygiene is also important to reduce your risk of infection.  Remember - BRUSH YOUR TEETH THE MORNING OF SURGERY WITH YOUR REGULAR TOOTHPASTE  Please do not use if you have  an allergy to CHG or antibacterial soaps. If your skin becomes reddened/irritated stop using the CHG.  Do not shave (including legs and underarms) for at least 48 hours prior to first CHG shower. It is OK to shave your face.  Please follow these instructions carefully.   Shower the NIGHT BEFORE SURGERY and the MORNING OF SURGERY with DIAL Soap.   Pat yourself dry with a CLEAN TOWEL.  Wear CLEAN PAJAMAS to bed the night before surgery  Place CLEAN SHEETS on your bed the night of your first shower and DO NOT SLEEP WITH PETS.   Day of Surgery: Please shower morning of surgery  Wear Clean/Comfortable clothing the morning of surgery Do not apply any deodorants/lotions.   Remember to brush your teeth WITH YOUR REGULAR TOOTHPASTE.   Questions were answered. Patient verbalized understanding of instructions.      '

## 2024-04-12 NOTE — Progress Notes (Signed)
 Anesthesia Chart Review: Vincent [name]  Case: [id number] Date/Time: 04/15/24 1015   Procedure: MRI WITH ANESTHESIA - MRI OF LUMBAR SPINE WITH AND WITHOUT CONTRAST   Anesthesia type: General   Diagnosis: Lumbar pseudoarthrosis [S32.009K]   Pre-op diagnosis: Lumbar pseudoarthrosis   [facility]   Surgeons: Radiologist, Medication, MD       DISCUSSION: Patient is a 69 year old male scheduled for the above procedure. MRI L-spine ordered by neurosurgeon Joshua Lenis, MD. H&P form is dated 03/30/2024 (scanned under Media tab).   History includes former smoker (quit 03/25/88), childhood murmur, DM2, CKD, OSA (does not have CPAP), skin cancer (s/p left facial BCC excision, rotation flp 11/03/2019), lumbar surgeries (x2), PNA (RLL cavitary pneumonia due to Streptococcus intermedius 02/2021; recurrent cavitary RUL 06/2023, s/p Augmentin  x 4 weeks), spinal surgery C3-6 ACDF 05/20/2022; L3-4 PLIF 03/05/2023). He reported intraoperative bleeding during his first back surgery around age 63 (see below).    I evaluated him on 10/27/2019 prior to a previous surgery given his reported childhood murmur and intra-operative bleeding history. I did not hear a murmur at that time. Per my previously note, In regards to his intraoperative bleeding history, he initially reported that he and his mother were free bleeders. However, he denied ever seeing a hematologist or formal diagnosis of a specific bleeding disorder.  To his knowledge, he is never required pre-operative DDVT, PLT, or clotting factors.  When asked where the free bleeder term came from, he said that it was his own term used to describe how his ex-wife explained his bleeding during his first back surgery around age 36 at [facility] by Dr. Elspeth Essex. He said that 5-6 months after his surgery, she told him that Dr. Essex had told her that they almost lost him during surgery because they couldn't find where he was bleeding. He went on to  have subsequent surgeries including another back surgery at [facility] around age 56 also by Dr. Essex, left inguinal hernia repair, umbilitcal hernia repair ~ 2006, and I&D of left patella 10/27/16 for infection following knee aspiration without known bleeding issues. He reported recurrent epistaxis as a child, but not real issues as an adult. Denied any other know issues of spontaneously bleeding and also denied any prolonged bleeding with things like finger cuts. When I asked him about bleeding history in his mother, he reported that she had difficult to control bleeding after an MVA and then later was an alcoholic and fell and sustained a scalp laceration that was difficult to stop bleeding. He denied previous referral to a hematologist and denied any formal disorder of a specific bleeding disorder. Except for childhood episodes of epistaxis and intraoperative bleeding with his first back surgery, he denied any spontaneous bleeding or prolonged bleeding.    He is followed by pulmonologist Dr. Theophilus for recurrent right cavitary PNA. First in 02/2021, then recurrence in 06/2023. He was treated with 4 weeks Augmentin . 02/09/2024 chest CT showed  resolution of RUL consolidation with residual band-like scarring posterior RUL, moderate emphysema/inflammation changes, non-specific thickening of the distal esophagus (referred to GI). PFTs considered, but he wished to hold off given current back issues so will monitory symptoms and consider inhalers if any worsening. Some dysphagia with medication administration. Has been prescribed Augmentin  or amoxicillin  for dental issues with perceived benefit in preventing pneumonia recurrence. One year follow-up planned.    He is a same day work-up, so updated labs and anesthesia team evaluation on the day of  procedure.    VS:  Wt Readings from Last 3 Encounters:  02/23/24 78.9 kg  10/23/23 67 kg  07/01/23 62 kg   BP Readings from Last 3 Encounters:  02/23/24 118/74   10/23/23 122/72  07/05/23 131/69   Pulse Readings from Last 3 Encounters:  02/23/24 96  10/23/23 91  07/05/23 75     PROVIDERS: Loring Tanda Mae, MD is PCP  Theophilus Roosevelt, MD is pulmonologist   LABS:  Lab Results  Component Value Date   WBC 8.0 07/03/2023   HGB 9.4 (L) 07/03/2023   HCT 29.7 (L) 07/03/2023   PLT 161 07/03/2023   GLUCOSE 179 (H) 07/03/2023   NA 132 (L) 07/03/2023   K 4.2 07/03/2023   CL 99 07/03/2023   CREATININE 1.39 (H) 07/03/2023   BUN 13 07/03/2023   BUN 14 07/03/2023   CO2 24 07/03/2023   CO2 25 07/03/2023   HGBA1C 6.6 (H) 07/01/2023    IMAGES: CT Chest 02/09/2024: IMPRESSION: 1. Resolution of the prior right upper lobe cavitary consolidation with residual band-like scarring in the posterior right upper lobe. 2. Moderate emphysema and bronchial wall thickening, consistent with airway inflammation; given emphysema and typical screening age, consider evaluation for eligibility for a low-dose CT lung cancer screening program. 3. Thickening of the distal esophagus is nonspecific and may relate to esophagitis (e.g., reflux); correlate clinically and consider endoscopic evaluation if indicated.   CT L-spine 10/08/2023: IMPRESSION: 1. 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 Subsidence of the interbody spacers. Vacuum phenomenon. Pronounced lucency around the posterior screws, particularly on the right at L3 and on the left at L4. On the right at L3, the screw erodes the endplate. Question very minimal endplate erosion on the left. 2. 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 Bilateral foraminal stenosis that could cause neural compression. 3. L1-2: Bilateral lateral recess and foraminal stenosis, left worse than right. Neural compression is possible on the left. 4. Bilateral sacroiliac osteoarthritis, left worse than right, which could be painful.    CT Chest/abd/pelvis 06/30/2023: IMPRESSION: 1. Partial right upper lobe consolidation, confluent  with more rounded masslike consolidation in the superior right lower lobe. This demonstrates internal low-density areas and gas bubbles, suspicious for necrotic pneumonia and developing cavitation or possible abscess. Imaging follow-up to resolution advised to exclude underlying mass given the rounded masslike features of consolidation at the superior right lower lobe. Additional areas of suspected inflammation or infection in the left upper lobe. 2. Mildly prominent mediastinal lymph nodes, likely reactive. 3. Emphysema. 4. Gallstone. 5. Splenomegaly.   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    Nerve pain    per patient, both legs   Pneumonia     Past Surgical History:  Procedure Laterality Date   ANTERIOR CERVICAL DECOMP/DISCECTOMY FUSION N/A 05/20/2022   Procedure: ACDF - C3-C4 - C4-C5 - C5-C6;  Surgeon: Joshua Alm RAMAN, MD;  Location: [facility] OR;  Service: Neurosurgery;  Laterality: N/A;  BACK SURGERY     x2 Dec 2024   BRONCHIAL BIOPSY  03/15/2021   Procedure: BRONCHIAL BIOPSIES;  Surgeon: Neda Jennet LABOR, MD;  [facility];  Service: Pulmonary;;   BRONCHIAL WASHINGS  03/15/2021   Procedure: BRONCHIAL WASHINGS;  Surgeon: Neda Jennet LABOR, MD;  [facility];  Service: Pulmonary;;   HERNIA REPAIR     umbilical and right inguinal hernia   INCISION AND DRAINAGE OF WOUND Left 10/27/2016   Procedure: IRRIGATION AND DEBRIDEMENT LEFT PATELLA;  Surgeon: Addie Cordella Hamilton, MD;  [facility];  Service: Orthopedics;  Laterality: Left;   LESION REMOVAL Left 11/01/2019   Procedure: LEFT FACIAL LESION REMOVAL;  Surgeon: Jesus Oliphant, MD;  Location: [facility] OR;  Service: ENT;  Laterality: Left;   RADIOLOGY WITH ANESTHESIA N/A 02/05/2022   Procedure: MRI WITH ANESTHESIA OF CERVICAL SPINE WITHOUT CONTRAST;  Surgeon: Radiologist, Medication, MD;  [facility];  Service: Radiology;  Laterality: N/A;   SPINE SURGERY     x2   VIDEO BRONCHOSCOPY N/A 03/15/2021   Procedure: VIDEO BRONCHOSCOPY WITH FLUORO;  Surgeon: Neda Jennet LABOR, MD;  [facility];  Service: Pulmonary;  Laterality: N/A;    MEDICATIONS:  diclofenac  (VOLTAREN ) 75 MG EC tablet   diclofenac  Sodium (VOLTAREN ) 1 % GEL   diphenhydrAMINE (BENADRYL) 25 MG tablet   famotidine (PEPCID) 20 MG tablet   ferrous sulfate 325 (65 FE) MG EC tablet   gabapentin  (NEURONTIN ) 300 MG capsule   oxyCODONE  (ROXICODONE ) 15 MG immediate release tablet   tamsulosin  (FLOMAX ) 0.4 MG CAPS capsule   blood glucose meter kit and supplies   Lactobacillus (ACIDOPHILUS) CAPS capsule   loratadine  (CLARITIN ) 10 MG tablet   meclizine  (ANTIVERT ) 25 MG tablet   metFORMIN  (GLUCOPHAGE -XR) 500 MG 24 hr tablet   ondansetron  (ZOFRAN ) 8 MG tablet    Isaiah Ruder, PA-C Surgical Short Stay/Anesthesiology [facility] Phone [phone] [facility] Phone [phone] 04/12/2024 3:40 PM

## 2024-04-15 ENCOUNTER — Ambulatory Visit (HOSPITAL_COMMUNITY)
Admission: RE | Admit: 2024-04-15 | Discharge: 2024-04-15 | Disposition: A | Discharge status: Home / Self Care | Payer: PRIVATE HEALTH INSURANCE | Attending: Neurological Surgery | Admitting: Neurological Surgery

## 2024-04-15 ENCOUNTER — Encounter (HOSPITAL_COMMUNITY): Payer: Self-pay | Admitting: Neurological Surgery

## 2024-04-15 ENCOUNTER — Ambulatory Visit (HOSPITAL_COMMUNITY): Payer: PRIVATE HEALTH INSURANCE

## 2024-04-15 ENCOUNTER — Encounter (HOSPITAL_COMMUNITY)
Admission: RE | Disposition: A | Discharge status: Home / Self Care | Payer: Self-pay | Source: Home / Self Care | Attending: Neurological Surgery

## 2024-04-15 ENCOUNTER — Ambulatory Visit (HOSPITAL_BASED_OUTPATIENT_CLINIC_OR_DEPARTMENT_OTHER): Payer: Self-pay | Admitting: Vascular Surgery

## 2024-04-15 ENCOUNTER — Encounter (HOSPITAL_COMMUNITY): Payer: Self-pay | Admitting: Vascular Surgery

## 2024-04-15 ENCOUNTER — Other Ambulatory Visit: Payer: Self-pay

## 2024-04-15 DIAGNOSIS — S32009K Unspecified fracture of unspecified lumbar vertebra, subsequent encounter for fracture with nonunion: Secondary | ICD-10-CM | POA: Diagnosis not present

## 2024-04-15 DIAGNOSIS — Z79899 Other long term (current) drug therapy: Secondary | ICD-10-CM | POA: Insufficient documentation

## 2024-04-15 DIAGNOSIS — I10 Essential (primary) hypertension: Secondary | ICD-10-CM | POA: Diagnosis not present

## 2024-04-15 DIAGNOSIS — G4733 Obstructive sleep apnea (adult) (pediatric): Secondary | ICD-10-CM | POA: Diagnosis not present

## 2024-04-15 DIAGNOSIS — Z7984 Long term (current) use of oral hypoglycemic drugs: Secondary | ICD-10-CM | POA: Diagnosis not present

## 2024-04-15 DIAGNOSIS — E1122 Type 2 diabetes mellitus with diabetic chronic kidney disease: Secondary | ICD-10-CM | POA: Diagnosis not present

## 2024-04-15 DIAGNOSIS — N189 Chronic kidney disease, unspecified: Secondary | ICD-10-CM | POA: Diagnosis not present

## 2024-04-15 DIAGNOSIS — M48061 Spinal stenosis, lumbar region without neurogenic claudication: Secondary | ICD-10-CM | POA: Insufficient documentation

## 2024-04-15 DIAGNOSIS — Z87891 Personal history of nicotine dependence: Secondary | ICD-10-CM | POA: Insufficient documentation

## 2024-04-15 DIAGNOSIS — I129 Hypertensive chronic kidney disease with stage 1 through stage 4 chronic kidney disease, or unspecified chronic kidney disease: Secondary | ICD-10-CM | POA: Diagnosis not present

## 2024-04-15 LAB — GLUCOSE, CAPILLARY
Glucose-Capillary: 168 mg/dL — ABNORMAL HIGH (ref 70–99)
Glucose-Capillary: 80 mg/dL (ref 70–99)

## 2024-04-15 LAB — CBC
HCT: 31.8 % — ABNORMAL LOW (ref 39.0–52.0)
Hemoglobin: 10.5 g/dL — ABNORMAL LOW (ref 13.0–17.0)
MCH: 26.8 pg (ref 26.0–34.0)
MCHC: 33 g/dL (ref 30.0–36.0)
MCV: 81.1 fL (ref 80.0–100.0)
Platelets: 134 K/uL — ABNORMAL LOW (ref 150–400)
RBC: 3.92 MIL/uL — ABNORMAL LOW (ref 4.22–5.81)
RDW: 14.6 % (ref 11.5–15.5)
WBC: 4.5 K/uL (ref 4.0–10.5)
nRBC: 0 % (ref 0.0–0.2)

## 2024-04-15 LAB — BASIC METABOLIC PANEL WITH GFR
Anion gap: 13 (ref 5–15)
BUN: 29 mg/dL — ABNORMAL HIGH (ref 8–23)
CO2: 23 mmol/L (ref 22–32)
Calcium: 8.6 mg/dL — ABNORMAL LOW (ref 8.9–10.3)
Chloride: 105 mmol/L (ref 98–111)
Creatinine, Ser: 1.7 mg/dL — ABNORMAL HIGH (ref 0.61–1.24)
GFR, Estimated: 43 mL/min — ABNORMAL LOW
Glucose, Bld: 141 mg/dL — ABNORMAL HIGH (ref 70–99)
Potassium: 3.8 mmol/L (ref 3.5–5.1)
Sodium: 141 mmol/L (ref 135–145)

## 2024-04-15 MED ORDER — CHLORHEXIDINE GLUCONATE 0.12 % MT SOLN
OROMUCOSAL | Status: AC
  Administered 2024-04-15: 15 mL via OROMUCOSAL
  Filled 2024-04-15: qty 15

## 2024-04-15 MED ORDER — PHENYLEPHRINE HCL-NACL 20-0.9 MG/250ML-% IV SOLN
INTRAVENOUS | Status: DC | PRN
  Administered 2024-04-15: 15 ug/min via INTRAVENOUS

## 2024-04-15 MED ORDER — MIDAZOLAM HCL (PF) 2 MG/2ML IJ SOLN
INTRAMUSCULAR | Status: DC | PRN
  Administered 2024-04-15: 2 mg via INTRAVENOUS

## 2024-04-15 MED ORDER — LACTATED RINGERS IV SOLN: INTRAVENOUS | Status: DC

## 2024-04-15 MED ORDER — ACETAMINOPHEN 10 MG/ML IV SOLN: 1000.0000 mg | Freq: Once | INTRAVENOUS | Status: DC | PRN

## 2024-04-15 MED ORDER — ORAL CARE MOUTH RINSE: 15.0000 mL | Freq: Once | OROMUCOSAL | Status: AC

## 2024-04-15 MED ORDER — INSULIN ASPART 100 UNIT/ML IJ SOLN
0.0000 [IU] | INTRAMUSCULAR | Status: DC | PRN
  Administered 2024-04-15: 2 [IU] via SUBCUTANEOUS
  Filled 2024-04-15: qty 2

## 2024-04-15 MED ORDER — DROPERIDOL 2.5 MG/ML IJ SOLN: 0.6250 mg | Freq: Once | INTRAMUSCULAR | Status: DC | PRN

## 2024-04-15 MED ORDER — LIDOCAINE 2% (20 MG/ML) 5 ML SYRINGE
INTRAMUSCULAR | Status: DC | PRN
  Administered 2024-04-15: 100 mg via INTRAVENOUS

## 2024-04-15 MED ORDER — CHLORHEXIDINE GLUCONATE 0.12 % MT SOLN: 15.0000 mL | Freq: Once | OROMUCOSAL | Status: AC

## 2024-04-15 MED ORDER — FENTANYL CITRATE (PF) 250 MCG/5ML IJ SOLN
INTRAMUSCULAR | Status: DC | PRN
  Administered 2024-04-15: 50 ug via INTRAVENOUS

## 2024-04-15 MED ORDER — FENTANYL CITRATE (PF) 100 MCG/2ML IJ SOLN
INTRAMUSCULAR | Status: AC
  Filled 2024-04-15: qty 2

## 2024-04-15 MED ORDER — GADOBUTROL 1 MMOL/ML IV SOLN
7.5000 mL | Freq: Once | INTRAVENOUS | Status: AC | PRN
  Administered 2024-04-15: 7.5 mL via INTRAVENOUS

## 2024-04-15 MED ORDER — OXYCODONE HCL 5 MG/5ML PO SOLN: 5.0000 mg | Freq: Once | ORAL | Status: DC | PRN

## 2024-04-15 MED ORDER — FENTANYL CITRATE (PF) 100 MCG/2ML IJ SOLN: 25.0000 ug | INTRAMUSCULAR | Status: DC | PRN

## 2024-04-15 MED ORDER — OXYCODONE HCL 5 MG PO TABS: 5.0000 mg | ORAL_TABLET | Freq: Once | ORAL | Status: DC | PRN

## 2024-04-15 MED ORDER — PROPOFOL 10 MG/ML IV BOLUS
INTRAVENOUS | Status: DC | PRN
  Administered 2024-04-15: 150 mg via INTRAVENOUS

## 2024-04-15 MED ORDER — ONDANSETRON HCL 4 MG/2ML IJ SOLN
INTRAMUSCULAR | Status: DC | PRN
  Administered 2024-04-15: 4 mg via INTRAVENOUS

## 2024-04-15 MED ORDER — MIDAZOLAM HCL 2 MG/2ML IJ SOLN
INTRAMUSCULAR | Status: AC
  Filled 2024-04-15: qty 2

## 2024-04-15 MED ORDER — DEXAMETHASONE SOD PHOSPHATE PF 10 MG/ML IJ SOLN
INTRAMUSCULAR | Status: DC | PRN
  Administered 2024-04-15: 5 mg via INTRAVENOUS

## 2024-04-15 NOTE — Transfer of Care (Signed)
 Immediate Anesthesia Transfer of Care Note  Patient: Vincent [name]  Procedure(s) Performed: MRI WITH ANESTHESIA  Patient Location: PACU  Anesthesia Type:General  Level of Consciousness: awake, alert , and oriented  Airway & Oxygen Therapy: Patient Spontanous Breathing and Patient connected to face mask oxygen  Post-op Assessment: Report given to RN and Post -op Vital signs reviewed and stable  Post vital signs: Reviewed and stable  Last Vitals:  Vitals Value Taken Time  BP 120/66 04/15/24 11:32  Temp    Pulse 89 04/15/24 11:33  Resp 4 04/15/24 11:33  SpO2 96 % 04/15/24 11:33  Vitals shown include unfiled device data.  Last Pain:  Vitals:   04/15/24 0924  TempSrc:   PainSc: 4       Patients Stated Pain Goal: 3 (04/15/24 0924)  Complications: No notable events documented.

## 2024-04-15 NOTE — Anesthesia Procedure Notes (Signed)
 Procedure Name: LMA Insertion Date/Time: 04/15/2024 10:45 AM  Performed by: Eriverto Byrnes J, CRNAPre-anesthesia Checklist: Patient identified, Emergency Drugs available, Suction available and Patient being monitored Patient Re-evaluated:Patient Re-evaluated prior to induction Oxygen Delivery Method: Circle System Utilized Preoxygenation: Pre-oxygenation with 100% oxygen Induction Type: IV induction Ventilation: Mask ventilation without difficulty LMA: LMA inserted and LMA with gastric port inserted LMA Size: 5.0 Number of attempts: 1 Airway Equipment and Method: Bite block Placement Confirmation: positive ETCO2 Tube secured with: Tape Dental Injury: Teeth and Oropharynx as per pre-operative assessment

## 2024-04-15 NOTE — Anesthesia Postprocedure Evaluation (Signed)
"   Anesthesia Post Note  Patient: Vincent [name]  Procedure(s) Performed: MRI WITH ANESTHESIA     Patient location during evaluation: PACU Anesthesia Type: General Level of consciousness: awake and alert Pain management: pain level controlled Vital Signs Assessment: post-procedure vital signs reviewed and stable Respiratory status: spontaneous breathing, nonlabored ventilation, respiratory function stable and patient connected to nasal cannula oxygen Cardiovascular status: blood pressure returned to baseline and stable Postop Assessment: no apparent nausea or vomiting Anesthetic complications: no   No notable events documented.  Last Vitals:  Vitals:   04/15/24 1145 04/15/24 1200  BP: 119/69 119/67  Pulse: 94 91  Resp: 12 14  Temp:  37 C  SpO2: 93% 92%    Last Pain:  Vitals:   04/15/24 1200  TempSrc:   PainSc: 0-No pain                 [name]      "

## 2024-04-16 ENCOUNTER — Encounter (HOSPITAL_COMMUNITY): Payer: Self-pay | Admitting: Radiology

## 2024-04-29 ENCOUNTER — Encounter: Payer: Self-pay | Admitting: Gastroenterology

## 2024-04-29 ENCOUNTER — Ambulatory Visit: Admitting: Gastroenterology

## 2024-04-29 VITALS — BP 150/60 | HR 88 | Ht 70.0 in | Wt 165.2 lb

## 2024-04-29 DIAGNOSIS — R933 Abnormal findings on diagnostic imaging of other parts of digestive tract: Secondary | ICD-10-CM | POA: Diagnosis not present

## 2024-04-29 DIAGNOSIS — K219 Gastro-esophageal reflux disease without esophagitis: Secondary | ICD-10-CM

## 2024-04-29 DIAGNOSIS — Z1211 Encounter for screening for malignant neoplasm of colon: Secondary | ICD-10-CM | POA: Insufficient documentation

## 2024-04-29 MED ORDER — NA SULFATE-K SULFATE-MG SULF 17.5-3.13-1.6 GM/177ML PO SOLN: 1.0000 | Freq: Once | ORAL | 0 refills | Status: AC

## 2024-04-29 NOTE — Patient Instructions (Signed)
 You have been scheduled for an endoscopy and colonoscopy. Please follow the written instructions given to you at your visit today.  If you use inhalers (even only as needed), please bring them with you on the day of your procedure.  DO NOT TAKE 7 DAYS PRIOR TO TEST- Trulicity (dulaglutide) Ozempic, Wegovy (semaglutide) Mounjaro, Zepbound (tirzepatide) Bydureon Bcise (exanatide extended release)  DO NOT TAKE 1 DAY PRIOR TO YOUR TEST Rybelsus (semaglutide) Adlyxin (lixisenatide) Victoza (liraglutide) Byetta (exanatide) ________________________________________________________________________  _______________________________________________________  If your blood pressure at your visit was 140/90 or greater, please contact your primary care physician to follow up on this.  _______________________________________________________  If you are age 68 or older, your body mass index should be between 23-30. Your Body mass index is 23.71 kg/m. If this is out of the aforementioned range listed, please consider follow up with your Primary Care Provider.  If you are age 60 or younger, your body mass index should be between 19-25. Your Body mass index is 23.71 kg/m. If this is out of the aformentioned range listed, please consider follow up with your Primary Care Provider.   ________________________________________________________  The [facility] providers would like to encourage you to use MYCHART to communicate with providers for non-urgent requests or questions.  Due to long hold times on the telephone, sending your provider a message by [facility] may be a faster and more efficient way to get a response.  Please allow 48 business hours for a response.  Please remember that this is for non-urgent requests.  _______________________________________________________  [facility] is using a team-based approach to care.  Your team is made up of your doctor and two to three APPS. Our APPS  (Nurse Practitioners and Physician Assistants) work with your physician to ensure care continuity for you. They are fully qualified to address your health concerns and develop a treatment plan. They communicate directly with your gastroenterologist to care for you. Seeing the Advanced Practice Practitioners on your physician's team can help you by facilitating care more promptly, often allowing for earlier appointments, access to diagnostic testing, procedures, and other specialty referrals.

## 2024-04-29 NOTE — Progress Notes (Signed)
 "    04/29/2024 NINA HOAR [id number] 1955/03/28   Discussed the use of AI scribe software for clinical note transcription with the patient, who gave verbal consent to proceed.  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  He has never undergone a colonoscopy. Approximately one year ago, he was scheduled for a colonoscopy at [facility] in [facility], but cancelled the procedure himself after completing bowel preparation, feeling he had not adequately cleared his bowels. Back surgery followed, which delayed rescheduling. He lives alone and has had difficulty arranging transportation, preferring to have the procedure locally in [place]. He wishes to complete colorectal cancer screening prior to any further back surgery if possible.  No issues with bowel movements, including no blood per rectum, constipation, or diarrhea. No changes in bowel habits or gastrointestinal symptoms related to bowel function.  He experiences occasional heartburn, which resolves when he eats before taking his medications. No current symptoms of acid reflux, indigestion, or significant gastrointestinal discomfort. No recent food impaction or sensation of food getting stuck. A CT scan performed for his pulmonologist showed esophageal wall thickening, and he has a history of reflux, but he reports no current symptoms when eating before medications.  Intermittent difficulty swallowing, particularly with large antibiotic pills, which he breaks into quarters to facilitate swallowing. No current dysphagia with food or liquids.  No chest pain or other esophageal symptoms currently.  History of pulmonary scarring and prior episodes during COVID infection in 2022 or 2023, followed by pulmonology. Not on supplemental oxygen and currently sees pulmonology annually. Prior CT scan showed resolution of a lesion and band-like  scarring. No current respiratory symptoms.   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    Nerve pain    per patient, both legs   Pneumonia    Past Surgical History:  Procedure Laterality Date   ANTERIOR CERVICAL DECOMP/DISCECTOMY FUSION N/A 05/20/2022   Procedure: ACDF - C3-C4 - C4-C5 - C5-C6;  Surgeon: Joshua Alm RAMAN, MD;  Location: [facility] OR;  Service: Neurosurgery;  Laterality: N/A;   BACK SURGERY     x2 Dec 2024   BRONCHIAL BIOPSY  03/15/2021   Procedure: BRONCHIAL BIOPSIES;  Surgeon: Neda Jennet LABOR, MD;  [facility];  Service: Pulmonary;;   BRONCHIAL WASHINGS  03/15/2021   Procedure: BRONCHIAL WASHINGS;  Surgeon: Neda Jennet LABOR, MD;  [facility];  Service: Pulmonary;;   HERNIA REPAIR     umbilical and right inguinal hernia   INCISION AND DRAINAGE OF WOUND Left 10/27/2016   Procedure: IRRIGATION AND DEBRIDEMENT LEFT PATELLA;  Surgeon: Addie Cordella Hamilton, MD;  [facility];  Service: Orthopedics;  Laterality: Left;   LESION REMOVAL Left 11/01/2019   Procedure: LEFT FACIAL LESION REMOVAL;  Surgeon: Jesus Oliphant, MD;  Location: [facility] OR;  Service: ENT;  Laterality: Left;   RADIOLOGY WITH ANESTHESIA N/A 02/05/2022   Procedure: MRI WITH ANESTHESIA OF  CERVICAL SPINE WITHOUT CONTRAST;  Surgeon: Radiologist, Medication, MD;  [facility];  Service: Radiology;  Laterality: N/A;   RADIOLOGY WITH ANESTHESIA N/A 04/15/2024   Procedure: MRI WITH ANESTHESIA;  Surgeon: Radiologist, Medication, MD;  [facility];  Service: Radiology;  Laterality:  N/A;  MRI OF LUMBAR SPINE WITH AND WITHOUT CONTRAST   SPINE SURGERY     x2   VIDEO BRONCHOSCOPY N/A 03/15/2021   Procedure: VIDEO BRONCHOSCOPY WITH FLUORO;  Surgeon: Neda Jennet LABOR, MD;  [facility];  Service: Pulmonary;  Laterality: N/A;    reports that he quit smoking about 36 years ago. His smoking use included cigarettes. He has been exposed to tobacco smoke. He has never used smokeless tobacco. He reports that he does not drink alcohol and does not use drugs. family history is not on file. Allergies[1]    Outpatient Encounter Medications as of 04/29/2024  Medication Sig   blood glucose meter kit and supplies Dispense based on patient and insurance preference. Use up to four times daily as directed. (FOR ICD-10 E11.8).   diclofenac  (VOLTAREN ) 75 MG EC tablet Take 75 mg by mouth 2 (two) times daily.   diclofenac  Sodium (VOLTAREN ) 1 % GEL Apply 1 application topically daily as needed (back pain).   diphenhydrAMINE (BENADRYL) 25 MG tablet Take 75-100 mg by mouth at bedtime as needed for sleep.   famotidine (PEPCID) 20 MG tablet Take 20 mg by mouth daily as needed for heartburn or indigestion.   ferrous sulfate 325 (65 FE) MG EC tablet Take 1 tablet by mouth at bedtime.   gabapentin  (NEURONTIN ) 300 MG capsule Take 600 mg by mouth 3 (three) times daily.   meclizine  (ANTIVERT ) 25 MG tablet Take 1 tablet (25 mg total) by mouth 3 (three) times daily as needed for dizziness or nausea.   metFORMIN  (GLUCOPHAGE -XR) 500 MG 24 hr tablet Take 500 mg by mouth 2 (two) times daily.   ondansetron  (ZOFRAN ) 8 MG tablet Take 1 tablet (8 mg total) by mouth every 8 (eight) hours as needed for vomiting or nausea.   oxyCODONE  (ROXICODONE ) 15 MG immediate release tablet Take 1 tablet (15 mg total) by mouth every 6 (six) hours as needed for pain.   tamsulosin  (FLOMAX ) 0.4 MG CAPS capsule Take 0.4 mg by mouth daily after supper.   [DISCONTINUED] Lactobacillus (ACIDOPHILUS) CAPS capsule Take 1 capsule by  mouth 3 (three) times daily with meals. (Patient not taking: Reported on 04/09/2024)   [DISCONTINUED] loratadine  (CLARITIN ) 10 MG tablet Take 1 tablet (10 mg total) by mouth daily. (Patient not taking: No sig reported)   No facility-administered encounter medications on file as of 04/29/2024.    REVIEW OF SYSTEMS  : All other systems reviewed and negative except where noted in the History of Present Illness.   PHYSICAL EXAM: BP (!) 150/60   Pulse 88   Ht 5' 10 (1.778 m)   Wt 165 lb 4 oz (75 kg)   BMI 23.71 kg/m  General: Well developed white male in no acute distress Head: Normocephalic and atraumatic Eyes:  Sclerae anicteric, conjunctiva pink. Ears: Normal auditory acuity Lungs: Clear throughout to auscultation; no W/R/R. Heart: Regular rate and rhythm; no M/R/G. Rectal:  Will be done at the time of colonoscopy. Musculoskeletal: Symmetrical with no gross deformities  Skin: No lesions on visible extremities Neurological: Alert oriented x 4, grossly non-focal Psychological:  Alert and cooperative. Normal mood and affect Assessment & Plan Colorectal cancer screening Screening is indicated due to age and  absence of gastrointestinal symptoms. He prefers to complete screening prior to upcoming back surgery.  Never had a colonoscopy in the past. - Scheduled colonoscopy for colorectal cancer screening with Dr. Shila.  Esophageal wall thickening and gastroesophageal reflux Chronic intermittent heartburn and esophageal wall thickening on CT warrant endoscopic evaluation. He currently has no dysphagia or reflux symptoms, but previously experienced pill dysphagia.  - Scheduled upper endoscopy (EGD) to be performed concurrently with colonoscopy to evaluate esophageal wall thickening and assess for mucosal pathology. - Discussed initiation of acid suppression therapy if significant esophageal irritation is identified.   CC:  Loring Tanda Mae, MD       [1]  Allergies Allergen  Reactions   Ivp Dye [Iodinated Contrast Media] Nausea And Vomiting    Severe vomiting   Penicillins Nausea And Vomiting   "

## 2024-06-09 ENCOUNTER — Encounter: Admitting: Gastroenterology

## 2024-06-14 ENCOUNTER — Encounter: Admitting: Gastroenterology
